# Patient Record
Sex: Female | Born: 1955 | Race: White | Hispanic: No | Marital: Married | Smoking: Former smoker
Health system: Southern US, Community
[De-identification: ages and names within clinical notes are randomized; demographics above are authoritative.]

## PROBLEM LIST (undated history)

## (undated) DIAGNOSIS — F32A Depression, unspecified: Secondary | ICD-10-CM

## (undated) DIAGNOSIS — R87619 Unspecified abnormal cytological findings in specimens from cervix uteri: Secondary | ICD-10-CM

## (undated) DIAGNOSIS — F329 Major depressive disorder, single episode, unspecified: Secondary | ICD-10-CM

## (undated) DIAGNOSIS — I10 Essential (primary) hypertension: Secondary | ICD-10-CM

## (undated) DIAGNOSIS — K8020 Calculus of gallbladder without cholecystitis without obstruction: Secondary | ICD-10-CM

## (undated) DIAGNOSIS — E119 Type 2 diabetes mellitus without complications: Secondary | ICD-10-CM

## (undated) HISTORY — DX: Type 2 diabetes mellitus without complications: E11.9

## (undated) HISTORY — DX: Unspecified abnormal cytological findings in specimens from cervix uteri: R87.619

## (undated) HISTORY — DX: Essential (primary) hypertension: I10

## (undated) HISTORY — DX: Major depressive disorder, single episode, unspecified: F32.9

## (undated) HISTORY — PX: COLPOSCOPY: SHX161

## (undated) HISTORY — DX: Depression, unspecified: F32.A

## (undated) HISTORY — PX: LUMBAR EPIDURAL INJECTION: SHX1980

## (undated) HISTORY — PX: CERVIX LESION DESTRUCTION: SHX591

---
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
## 1998-08-30 ENCOUNTER — Other Ambulatory Visit: Admission: RE | Admit: 1998-08-30 | Discharge: 1998-08-30 | Payer: Self-pay | Admitting: Obstetrics and Gynecology

## 1999-06-11 ENCOUNTER — Other Ambulatory Visit: Admission: RE | Admit: 1999-06-11 | Discharge: 1999-06-11 | Payer: Self-pay | Admitting: *Deleted

## 1999-10-10 ENCOUNTER — Encounter: Admission: RE | Admit: 1999-10-10 | Discharge: 1999-10-10 | Payer: Self-pay | Admitting: Family Medicine

## 1999-10-10 ENCOUNTER — Encounter: Payer: Self-pay | Admitting: Family Medicine

## 2000-06-25 ENCOUNTER — Other Ambulatory Visit: Admission: RE | Admit: 2000-06-25 | Discharge: 2000-06-25 | Payer: Self-pay | Admitting: *Deleted

## 2000-12-01 ENCOUNTER — Encounter: Payer: Self-pay | Admitting: *Deleted

## 2000-12-01 ENCOUNTER — Encounter: Admission: RE | Admit: 2000-12-01 | Discharge: 2000-12-01 | Payer: Self-pay | Admitting: *Deleted

## 2001-08-30 ENCOUNTER — Other Ambulatory Visit: Admission: RE | Admit: 2001-08-30 | Discharge: 2001-08-30 | Payer: Self-pay | Admitting: Obstetrics and Gynecology

## 2001-12-22 ENCOUNTER — Encounter: Admission: RE | Admit: 2001-12-22 | Discharge: 2001-12-22 | Payer: Self-pay | Admitting: Obstetrics and Gynecology

## 2001-12-22 ENCOUNTER — Encounter: Payer: Self-pay | Admitting: Obstetrics and Gynecology

## 2002-09-14 ENCOUNTER — Other Ambulatory Visit: Admission: RE | Admit: 2002-09-14 | Discharge: 2002-09-14 | Payer: Self-pay | Admitting: Obstetrics and Gynecology

## 2003-12-21 ENCOUNTER — Other Ambulatory Visit: Admission: RE | Admit: 2003-12-21 | Discharge: 2003-12-21 | Payer: Self-pay | Admitting: Obstetrics and Gynecology

## 2005-01-06 ENCOUNTER — Other Ambulatory Visit: Admission: RE | Admit: 2005-01-06 | Discharge: 2005-01-06 | Payer: Self-pay | Admitting: Obstetrics and Gynecology

## 2005-09-18 ENCOUNTER — Emergency Department (HOSPITAL_COMMUNITY): Admission: EM | Admit: 2005-09-18 | Discharge: 2005-09-18 | Payer: Self-pay | Admitting: Emergency Medicine

## 2007-10-24 ENCOUNTER — Emergency Department (HOSPITAL_COMMUNITY): Admission: EM | Admit: 2007-10-24 | Discharge: 2007-10-24 | Payer: Self-pay | Admitting: Emergency Medicine

## 2008-11-23 ENCOUNTER — Encounter: Admission: RE | Admit: 2008-11-23 | Discharge: 2008-11-23 | Payer: Self-pay | Admitting: Family Medicine

## 2011-01-22 LAB — DIFFERENTIAL
Basophils Absolute: 0.1
Basophils Relative: 1
Eosinophils Absolute: 0.1
Eosinophils Relative: 2
Lymphocytes Relative: 41
Lymphs Abs: 2.3
Monocytes Absolute: 0.4
Monocytes Relative: 7
Neutro Abs: 2.7
Neutrophils Relative %: 48

## 2011-01-22 LAB — CBC
HCT: 39.1
Hemoglobin: 13.7
MCHC: 35
MCV: 85
Platelets: 166
RBC: 4.6
RDW: 13.2
WBC: 5.7

## 2011-01-22 LAB — POCT I-STAT, CHEM 8
BUN: 10
Calcium, Ion: 1.14
Chloride: 107
Creatinine, Ser: 0.8
Glucose, Bld: 114 — ABNORMAL HIGH
TCO2: 24

## 2011-01-22 LAB — POCT CARDIAC MARKERS
Operator id: 196461
Troponin i, poc: <0.05

## 2012-07-20 ENCOUNTER — Other Ambulatory Visit: Payer: Self-pay | Admitting: Obstetrics and Gynecology

## 2012-07-20 DIAGNOSIS — R1011 Right upper quadrant pain: Secondary | ICD-10-CM

## 2012-07-22 ENCOUNTER — Ambulatory Visit
Admission: RE | Admit: 2012-07-22 | Discharge: 2012-07-22 | Disposition: A | Discharge status: Home / Self Care | Payer: BC Managed Care – PPO | Source: Ambulatory Visit | Attending: Obstetrics and Gynecology | Admitting: Obstetrics and Gynecology

## 2012-07-22 ENCOUNTER — Other Ambulatory Visit: Payer: Self-pay | Admitting: Obstetrics and Gynecology

## 2012-07-22 DIAGNOSIS — R1011 Right upper quadrant pain: Secondary | ICD-10-CM

## 2012-08-04 ENCOUNTER — Encounter (INDEPENDENT_AMBULATORY_CARE_PROVIDER_SITE_OTHER): Payer: Self-pay | Admitting: General Surgery

## 2012-08-04 ENCOUNTER — Ambulatory Visit (INDEPENDENT_AMBULATORY_CARE_PROVIDER_SITE_OTHER): Payer: BC Managed Care – PPO | Admitting: General Surgery

## 2012-08-04 VITALS — BP 132/80 | HR 64 | Temp 98.0°F | Resp 18 | Ht 63.0 in | Wt 192.0 lb

## 2012-08-04 DIAGNOSIS — K8020 Calculus of gallbladder without cholecystitis without obstruction: Secondary | ICD-10-CM

## 2012-08-04 DIAGNOSIS — R109 Unspecified abdominal pain: Secondary | ICD-10-CM

## 2012-08-04 HISTORY — DX: Calculus of gallbladder without cholecystitis without obstruction: K80.20

## 2012-08-04 NOTE — Progress Notes (Signed)
Patient ID: Maria Watkins, female   DOB: [birth year], 57 y.o.   MRN: [id number]  Chief Complaint  Patient presents with  . New Evaluation    Maria Watkins w/stone    HPI Maria Watkins is a 57 y.o. female.  She is referred by her gynecologist, Dr. Duane Lope, for evaluation of gallstones.Her husband is with her throughout the encounter today. Her sister-in-law is a breast patient of mine.  The patient is menopausal. She has a 6 month history of intermittent right lower quadrant abdominal pain. This occurs daily, and is usually after strenuous work or heavy lifting. It does not bother her at night. She can eat anything she wants. Doesn't really have any gastrointestinal symptoms. She says occasionally if she eats a hotdog without the bun she'll have a little bit of reflux, a little bit of indigestion but no nausea. No change in her bowel habits. When specifically questioned, she confirms that the pain is in the right lower abdomen, not the right upper abdomen.  A CT scan of the abdomen does show several small gallstones but no inflammatory change. The abdominal wall appears intact without any musculoskeletal abnormality.She did not get a CT scan of the pelvis. She has not had a pelvic ultrasound. She has not had any abdominal surgery in the past. One pregnancy and one vaginal delivery. Well-controlled hypertension.  She admits to being extremely anxious and is very fearful of having surgery.  HPI  History reviewed. No pertinent past medical history.  Past Surgical History  Procedure Laterality Date  . None      no hx  surgerys    Family History  Problem Relation Age of Onset  . Diabetes Mother   . Arthritis Mother   . COPD Father   . Asthma Father   . Heart disease Father   . Diabetes Sister   . COPD Sister   . Rheum arthritis Sister     Social History History  Substance Use Topics  . Smoking status: Former Games developer  . Smokeless tobacco: Not on file     Comment: quit 20 yrs ago  .  Alcohol Use: Not on file    No Known Allergies  Current Outpatient Prescriptions  Medication Sig Dispense Refill  . hydrochlorothiazide (MICROZIDE) 12.5 MG capsule Take 12.5 mg by mouth daily.       No current facility-administered medications for this visit.    Review of Systems Review of Systems  Constitutional: Negative for fever, chills and unexpected weight change.  HENT: Negative for hearing loss, congestion, sore throat, trouble swallowing and voice change.   Eyes: Negative for visual disturbance.  Respiratory: Negative for cough and wheezing.   Cardiovascular: Negative for chest pain, palpitations and leg swelling.  Gastrointestinal: Positive for abdominal pain. Negative for nausea, vomiting, diarrhea, constipation, blood in stool, abdominal distention and anal bleeding.  Genitourinary: Negative for hematuria, vaginal bleeding and difficulty urinating.  Musculoskeletal: Negative for arthralgias.  Skin: Negative for rash and wound.  Neurological: Negative for seizures, syncope and headaches.  Hematological: Negative for adenopathy. Does not bruise/bleed easily.  Psychiatric/Behavioral: Negative for confusion.    Blood pressure 132/80, pulse 64, temperature 98 F (36.7 C), resp. rate 18, height 5\' 3"  (1.6 m), weight 192 lb (87.091 kg).  Physical Exam Physical Exam  Constitutional: She is oriented to person, place, and time. She appears well-developed and well-nourished. No distress.  HENT:  Head: Normocephalic and atraumatic.  Nose: Nose normal.  Mouth/Throat: No oropharyngeal exudate.  Eyes: Conjunctivae  and EOM are normal. Pupils are equal, round, and reactive to light. Left eye exhibits no discharge. No scleral icterus.  Neck: Neck supple. No JVD present. No tracheal deviation present. No thyromegaly present.  Cardiovascular: Normal rate, regular rhythm, normal heart sounds and intact distal pulses.   No murmur heard. Pulmonary/Chest: Effort normal and breath sounds  normal. No respiratory distress. She has no wheezes. She has no rales. She exhibits no tenderness.  Abdominal: Soft. Bowel sounds are normal. She exhibits no distension and no mass. There is no tenderness. There is no rebound and no guarding.  Other than obesity, essentially a benign abdominal exam.  Musculoskeletal: She exhibits no edema and no tenderness.  Lymphadenopathy:    She has no cervical adenopathy.  Neurological: She is alert and oriented to person, place, and time. She exhibits normal muscle tone. Coordination normal.  Skin: Skin is warm. No rash noted. She is not diaphoretic. No erythema. No pallor.  Psychiatric: She has a normal mood and affect. Her behavior is normal. Judgment and thought content normal.    Data Reviewed CT scan of abdomen.  Assessment    Right lower quadrant abdominal pain of uncertain etiology. History and physical findings suggest abdominal wall or musculoskeletal pain. This does not sound like a visceral pain.  Gallstones. Probably asymptomatic.  Obesity  Anxiety  Hypertension     Plan    We had a very long talk about her symptoms and her imaging findings. She absolutely does not want to have gallbladder surgery unless it is essential, and I told her that it was not certain that the gallbladder was the source of her pain.  We talked about further confirmatory testing.  At the end of the conversation we decided that we would pursue a trial of limited activities and anti-inflammatory medications. She is restricted to 15 pounds lifting for 3 weeks, but no strenuous activities or gym activities. She is advised to walk daily. weight reduction is strongly advised. Low-fat diet is recommended.  I told her that if the right-sided pain continues after 2 or 3 weeks to give me a call and we will set up a CT scan of the pelvis to evaluate the abdominal wall and pelvic muscles, hepatobiliary scan with CCK stimulation, and liver function tests.  If the pain  resolves, nothing further needs to be done.        Angelia Mould. Derrell Lolling, M.D., [facility] General and Minimally invasive Surgery Breast and Colorectal Surgery Office:   [phone] Pager:   [phone]  08/04/2012, 10:45 AM

## 2012-08-04 NOTE — Patient Instructions (Signed)
You have gallstones, and these are documented on her CT scan.  Your right lower quadrant pain sounds more like musculoskeletal, or abdominal wall pain. It does not sound like gallbladder pain.  Your gallbladder may certainly cause problems in the future, but I am not sure that a gallbladder operation will lead to resolution of the pain it should have right now.  We talked about several options. We decided that you would stop all sports, all heavy lifting, and all strenuous activities for 2 or 3 weeks. Take Advil for pain.  If yuor pain continues, then we should proceed with CT scan of pelvis, hepatobiliary scan and liver testing.  Call Dr. Derrell Lolling if your pain does not completely resolve.

## 2012-08-19 ENCOUNTER — Encounter (INDEPENDENT_AMBULATORY_CARE_PROVIDER_SITE_OTHER): Payer: Self-pay

## 2014-04-27 LAB — HM COLONOSCOPY

## 2014-06-04 ENCOUNTER — Other Ambulatory Visit: Payer: Self-pay | Admitting: Gastroenterology

## 2014-06-04 DIAGNOSIS — R945 Abnormal results of liver function studies: Principal | ICD-10-CM

## 2014-06-04 DIAGNOSIS — R7989 Other specified abnormal findings of blood chemistry: Secondary | ICD-10-CM

## 2014-06-12 ENCOUNTER — Other Ambulatory Visit: Payer: Self-pay

## 2014-06-18 ENCOUNTER — Ambulatory Visit
Admission: RE | Admit: 2014-06-18 | Discharge: 2014-06-18 | Disposition: A | Discharge status: Home / Self Care | Payer: Self-pay | Source: Ambulatory Visit | Attending: Gastroenterology | Admitting: Gastroenterology

## 2014-06-18 DIAGNOSIS — R7989 Other specified abnormal findings of blood chemistry: Secondary | ICD-10-CM

## 2014-06-18 DIAGNOSIS — R945 Abnormal results of liver function studies: Principal | ICD-10-CM

## 2016-01-02 ENCOUNTER — Ambulatory Visit (INDEPENDENT_AMBULATORY_CARE_PROVIDER_SITE_OTHER): Payer: BLUE CROSS/BLUE SHIELD | Admitting: Obstetrics & Gynecology

## 2016-01-02 ENCOUNTER — Other Ambulatory Visit: Payer: Self-pay | Admitting: Obstetrics & Gynecology

## 2016-01-02 ENCOUNTER — Encounter: Payer: Self-pay | Admitting: Obstetrics & Gynecology

## 2016-01-02 VITALS — BP 144/86 | HR 74 | Wt 191.0 lb

## 2016-01-02 DIAGNOSIS — Z01419 Encounter for gynecological examination (general) (routine) without abnormal findings: Secondary | ICD-10-CM | POA: Diagnosis not present

## 2016-01-02 DIAGNOSIS — Z1231 Encounter for screening mammogram for malignant neoplasm of breast: Secondary | ICD-10-CM

## 2016-01-02 DIAGNOSIS — E2839 Other primary ovarian failure: Secondary | ICD-10-CM

## 2016-01-02 DIAGNOSIS — Z0000 Encounter for general adult medical examination without abnormal findings: Secondary | ICD-10-CM | POA: Diagnosis not present

## 2016-01-02 DIAGNOSIS — Z124 Encounter for screening for malignant neoplasm of cervix: Secondary | ICD-10-CM

## 2016-01-02 NOTE — Progress Notes (Signed)
60 y.o. G1P1001 MarriedCaucasianF here for annual exam.  New patient.  I see her sister.  Patient reports she is nervous today.  Husband is disabled after fall on concrete floor at work in 2009.  Had ruptured disk in his neck and low back.  Has had two surgeries with Dr. Marikay Alaravid Jones, NSG.  She worked at [employer] for years.  Retired in 2011.    Pt reports her biggest problem is weight and hypertension.  She is seeing Dr. Uvaldo RisingMcNeil.  They have been discussing weight loss.    Last pap was 2015.  This was normal.  Reports she did go for her MMG.    PCP:  Dr. Corliss BlackerMcNeill.  Will get flu shot next week.  Has six month check up done.    Patient's last menstrual period was 04/27/2005 (approximate).          Sexually active: No.  The current method of family planning is post menopausal status.    Exercising: No.  The patient does not participate in regular exercise at present. Smoker:  Former  Health Maintenance: Pap:  10/2013 Neg.  Pt thinks she's had an HPV test in 2015.  History of abnormal Pap:  Yes.  Had a follow-up that was abnormal.  Had colposcopy and several biopsies.  Had cryo of cervix.  Follow up tests were normal.  This was in the 1990's. MMG:  12/2014 Normal  Colonoscopy:  08/2014 Normal - f/u 10 years, Dr. Loreta AveMann BMD:   Never TDaP:  2015 with PCP Pneumonia vaccine(s): with PCP Zostavax:   No yet  Hep C testing: Unsure  Screening Labs: PCP   reports that she quit smoking about 24 years ago. She has never used smokeless tobacco. She reports that she does not drink alcohol or use drugs.  Past Medical History:  Diagnosis Date  . Abnormal Pap smear of cervix 1980's  . Hypertension     Past Surgical History:  Procedure Laterality Date  . CERVIX LESION DESTRUCTION  1980's   Normal paps since then   . COLPOSCOPY  1980's  . none     no hx  surgerys    Current Outpatient Prescriptions  Medication Sig Dispense Refill  . hydrochlorothiazide (MICROZIDE) 12.5 MG capsule Take 12.5 mg by  mouth daily.    . Pediatric Multiple Vit-C-FA (PEDIATRIC MULTIVITAMIN) chewable tablet Chew 1 tablet by mouth daily.     No current facility-administered medications for this visit.     Family History  Problem Relation Age of Onset  . Diabetes Mother   . Arthritis Mother   . COPD Father   . Asthma Father   . Heart disease Father   . Diabetes Sister   . Asthma Brother   . COPD Sister   . Rheum arthritis Sister   . Breast cancer Maternal Aunt     ROS:  Pertinent items are noted in HPI.  Otherwise, a comprehensive ROS was negative.  Exam:   BP (!) 144/86 (BP Location: Right Arm, Patient Position: Sitting, Cuff Size: Large)   Pulse 74   Wt 191 lb (86.6 kg)   LMP 04/27/2005 (Approximate)   BMI 33.83 kg/m        Ht Readings from Last 3 Encounters:  08/04/12 5\' 3"  (1.6 m)   General appearance: alert, cooperative and appears stated age Head: Normocephalic, without obvious abnormality, atraumatic Neck: no adenopathy, supple, symmetrical, trachea midline and thyroid normal to inspection and palpation Breasts: normal appearance, no masses or tenderness Abdomen:  soft, non-tender; bowel sounds normal; no masses,  no organomegaly Extremities: extremities normal, atraumatic, no cyanosis or edema Skin: Skin color, texture, turgor normal. No rashes or lesions Lymph nodes: Cervical, supraclavicular, and axillary nodes normal. No abnormal inguinal nodes palpated Neurologic: Grossly normal  Pelvic: External genitalia:  no lesions              Urethra:  normal appearing urethra with no masses, tenderness or lesions              Bartholins and Skenes: normal                 Vagina: normal appearing vagina with normal color and discharge, no lesions              Cervix: no lesions              Pap taken: Yes.   Bimanual Exam:  Uterus:  normal size, contour, position, consistency, mobility, non-tender              Adnexa: normal adnexa and no mass, fullness, tenderness                Rectovaginal: Confirms               Anus:  normal sphincter tone, no lesions  Chaperone was present for exam.  A:  Well Woman with normal exam PMP, no HRT H/O gallstones, still has gallbladder H/O cryosurgery to cervix in the 1990's  P:   Mammogram yearly recommended. pap smear obtained today.  Will have pt sign release of lab work to make sure she's had HPV testing Lab work next week with Dr. Uvaldo Rising Pt has discussed Zostavax with PCP.  Not sure she wants to get it. Will plan Hep C testing with Dr. Uvaldo Rising next week when pt goes for lab work. Return annually or prn

## 2016-01-06 LAB — IPS PAP TEST WITH REFLEX TO HPV

## 2016-01-07 LAB — HM HEPATITIS C SCREENING LAB: HM HEPATITIS C SCREENING: NEGATIVE

## 2016-01-15 ENCOUNTER — Ambulatory Visit
Admission: RE | Admit: 2016-01-15 | Discharge: 2016-01-15 | Disposition: A | Discharge status: Home / Self Care | Payer: BLUE CROSS/BLUE SHIELD | Source: Ambulatory Visit | Attending: Obstetrics & Gynecology | Admitting: Obstetrics & Gynecology

## 2016-01-15 DIAGNOSIS — Z1231 Encounter for screening mammogram for malignant neoplasm of breast: Secondary | ICD-10-CM

## 2016-01-15 DIAGNOSIS — E2839 Other primary ovarian failure: Secondary | ICD-10-CM

## 2016-01-22 ENCOUNTER — Ambulatory Visit: Payer: 59 | Admitting: Family Medicine

## 2016-10-16 ENCOUNTER — Telehealth: Payer: Self-pay | Admitting: Obstetrics & Gynecology

## 2016-10-16 NOTE — Telephone Encounter (Signed)
Patient called requesting bone density results from 2017.

## 2016-10-19 NOTE — Telephone Encounter (Signed)
Return call to patient, no answer, no option to leave message.   BMD 01/15/16 results:  Written by Jerene BearsMiller, Mary S, MD on 01/20/2016 6:58 AM  Mrs. Cullers,  Your bone density was completely normal. I will plan to repeat this in 5 years. Please let me know if you have any questions.   Dr. Hyacinth MeekerMiller

## 2016-10-21 NOTE — Telephone Encounter (Signed)
Returned call to patient, no answer, no option to leave message.

## 2016-10-23 NOTE — Telephone Encounter (Signed)
Dr. Hyacinth MeekerMiller, attempted to reach patient x2 with no return call regarding BMD results from 2017. OK to send letter with results and recommendations as seen below?

## 2016-10-23 NOTE — Telephone Encounter (Signed)
Yes, it is completley fine to send a letter.  Thanks.

## 2016-11-02 NOTE — Telephone Encounter (Signed)
Letter to Arna Mediciora for mailing certified. Will close encounter.

## 2016-11-02 NOTE — Telephone Encounter (Signed)
Letter to Dr. Miller for signature. 

## 2017-01-25 LAB — HM MAMMOGRAPHY

## 2017-04-02 ENCOUNTER — Ambulatory Visit: Payer: BLUE CROSS/BLUE SHIELD | Admitting: Obstetrics & Gynecology

## 2017-10-16 LAB — HEPATIC FUNCTION PANEL
ALT: 12 (ref 7–35)
AST: 14 (ref 13–35)

## 2017-10-16 LAB — BASIC METABOLIC PANEL
Creatinine: 0.7 (ref <=1.1)
Glucose: 139

## 2017-10-16 LAB — MICROALBUMIN, URINE: Microalb, Ur: 2.7

## 2017-10-16 LAB — BASIC METABOLIC PANEL WITH GFR
BUN: 12 (ref 4–21)
Sodium: 139 (ref 137–147)

## 2017-10-16 LAB — HEMOGLOBIN A1C: Hemoglobin A1C: 6.8

## 2018-01-18 ENCOUNTER — Ambulatory Visit: Payer: BLUE CROSS/BLUE SHIELD | Admitting: Family Medicine

## 2018-01-18 ENCOUNTER — Encounter: Payer: Self-pay | Admitting: Family Medicine

## 2018-01-18 ENCOUNTER — Other Ambulatory Visit: Payer: Self-pay

## 2018-01-18 VITALS — BP 130/80 | HR 66 | Temp 98.0°F | Ht 63.0 in | Wt 184.6 lb

## 2018-01-18 DIAGNOSIS — E785 Hyperlipidemia, unspecified: Secondary | ICD-10-CM | POA: Insufficient documentation

## 2018-01-18 DIAGNOSIS — Z23 Encounter for immunization: Secondary | ICD-10-CM | POA: Diagnosis not present

## 2018-01-18 DIAGNOSIS — E119 Type 2 diabetes mellitus without complications: Secondary | ICD-10-CM

## 2018-01-18 DIAGNOSIS — E118 Type 2 diabetes mellitus with unspecified complications: Secondary | ICD-10-CM | POA: Insufficient documentation

## 2018-01-18 DIAGNOSIS — I10 Essential (primary) hypertension: Secondary | ICD-10-CM | POA: Diagnosis not present

## 2018-01-18 DIAGNOSIS — E1169 Type 2 diabetes mellitus with other specified complication: Secondary | ICD-10-CM | POA: Insufficient documentation

## 2018-01-18 HISTORY — DX: Type 2 diabetes mellitus without complications: E11.9

## 2018-01-18 LAB — LDL CHOLESTEROL, DIRECT: Direct LDL: 94 mg/dL

## 2018-01-18 LAB — COMPREHENSIVE METABOLIC PANEL
ALBUMIN: 4.6 g/dL (ref 3.5–5.2)
ALK PHOS: 119 U/L — AB (ref 39–117)
ALT: 12 U/L (ref 0–35)
AST: 13 U/L (ref 0–37)
BUN: 11 mg/dL (ref 6–23)
CO2: 30 mEq/L (ref 19–32)
CREATININE: 0.69 mg/dL (ref 0.40–1.20)
Calcium: 9.9 mg/dL (ref 8.4–10.5)
Chloride: 103 mEq/L (ref 96–112)
GFR: 90.98 mL/min (ref >60.00)
GLUCOSE: 112 mg/dL — AB (ref 70–99)
Potassium: 4.1 mEq/L (ref 3.5–5.1)
SODIUM: 139 meq/L (ref 135–145)
TOTAL PROTEIN: 8 g/dL (ref 6.0–8.3)
Total Bilirubin: 1.2 mg/dL (ref 0.2–1.2)

## 2018-01-18 LAB — LIPID PANEL
Cholesterol: 181 mg/dL (ref 0–200)
HDL: 45.3 mg/dL (ref >39.00)
NONHDL: 135.9
Total CHOL/HDL Ratio: 4
Triglycerides: 322 mg/dL — ABNORMAL HIGH (ref 0.0–149.0)
VLDL: 64.4 mg/dL — ABNORMAL HIGH (ref 0.0–40.0)

## 2018-01-18 LAB — HEMOGLOBIN A1C: HEMOGLOBIN A1C: 6.7 % — AB (ref 4.6–6.5)

## 2018-01-18 LAB — MICROALBUMIN / CREATININE URINE RATIO
Creatinine,U: 150 mg/dL
MICROALB UR: 2.4 mg/dL — AB (ref 0.0–1.9)
Microalb Creat Ratio: 1.6 mg/g (ref 0.0–30.0)

## 2018-01-18 NOTE — Progress Notes (Signed)
Subjective  CC:  Chief Complaint  Patient presents with  . Establish Care    Transfer Care from [facility], last Physical January 2019, flu shot today.   . Diabetes    last A1c was in June 2019  . Hypertension    HPI: Maria Watkins is a 62 y.o. female who presents to [facility] at [facility] today to establish care with me as a new patient.   She has the following concerns or needs:  Former patient of [facility] primary care.  She brings in last visit from her PCP in June.  Has associated lab work including A1c of 6.8, normal CMP and positive microalbuminuria.  She has history of hypertension on lisinopril 5 mg daily.  Blood pressures have been well controlled.  She has been trying to manage her early diabetes without complications with dietary changes.  Her goal would be to avoid further medications and reverse disease if possible.  She is a married mother of a 48 year old son.  Her husband has some chronic medical diseases and patient is a primary caretaker for him.  She is retired since 2011.  She has close family ties with her siblings.  However, she is somewhat limited in doing things outside of the home because of her husband.  This weighs on her at times.  She denies symptoms of depression or history of mood disorder.  She tends to be optimistic.  Diet is fairly good although she drinks 2 to 3 20 ounce sodas per day.  She is not a big meat eater.  She tends to cook in the home.  Her husband likes to eat meat potato and veggies.  She enjoys vegetables.  She grows a garden.  She is motivated to work on and improve diet for weight loss and management of her diabetes.  She denies symptoms of hypoglycemia.  She has no foot concerns.  Eye exam is up-to-date.  She has not had her pneumonia vaccination yet.  She denies history of hyperlipidemia.  Assessment  1. Type 2 diabetes mellitus without complication, without long-term current use of insulin (HCC)   2.  Essential hypertension   3. Need for influenza vaccination      Plan   Type 2 diabetes: Discussion about diabetic management options, complications of disease, association with nutrition, optimal management strategies including statin, aspirin, and diabetic hypoglycemics.  Discussed complications of diabetes is uncontrolled.  Patient electing to change diet.  Will work on dietary changes, weight loss and recheck in 3 months.  No medication started today.  Patient defers Pneumovax until next visit.  This is reasonable  Check lab work including renal function, LFTs, lipids and urine.  Flu shot given today  Continue lisinopril for blood pressure control.  Currently well controlled.  Follow up:  Return in about 3 months (around 04/19/2018) for follow up of diabetes and hypertension. Orders Placed This Encounter  Procedures  . HM MAMMOGRAPHY  . Flu Vaccine QUAD 36+ mos IM  . Hemoglobin A1c  . Lipid panel  . Comprehensive metabolic panel  . Microalbumin / creatinine urine ratio  . HM HEPATITIS C SCREENING LAB  . Microalbumin, urine  . Basic metabolic panel  . Hepatic function panel  . Hemoglobin A1c  . HM COLONOSCOPY   No orders of the defined types were placed in this encounter.    No flowsheet data found.  We updated and reviewed the patient's past history in detail and it is documented  below.  Patient Active Problem List   Diagnosis Date Noted  . Essential hypertension 01/18/2018  . Type 2 diabetes mellitus without complication, without long-term current use of insulin (HCC) 01/18/2018  . Gallstones 08/04/2012   Health Maintenance  Topic Date Due  . MAMMOGRAM  01/25/2018  . HEMOGLOBIN A1C  04/17/2018  . OPHTHALMOLOGY EXAM  04/27/2018  . PAP SMEAR  01/02/2019  . FOOT EXAM  01/19/2019  . TETANUS/TDAP  04/28/2023  . COLONOSCOPY  08/25/2024  . INFLUENZA VACCINE  Completed  . Hepatitis C Screening  Completed  . HIV Screening  Discontinued   Immunization History    Administered Date(s) Administered  . Influenza,inj,Quad PF,6+ Mos 01/18/2018  . Tdap 04/27/2013   No outpatient medications have been marked as taking for the 01/18/18 encounter (Office Visit) with Willow OraAndy, Lazar Tierce L, MD.    Allergies: Patient is allergic to terconazole. Past Medical History Patient  has a past medical history of Abnormal Pap smear of cervix (1980's), Depression, Hypertension, and Type 2 diabetes mellitus without complication, without long-term current use of insulin (HCC) (01/18/2018). Past Surgical History Patient  has a past surgical history that includes Colposcopy (1980's) and Cervix lesion destruction (1980s). Family History: Patient family history includes Alcohol abuse in her son; Arthritis in her mother; Asthma in her brother and father; Breast cancer in her maternal aunt; COPD in her father and sister; Diabetes in her mother and sister; Heart disease in her father; Rheum arthritis in her sister. Social History:  Patient  reports that she quit smoking about 26 years ago. She has never used smokeless tobacco. She reports that she does not drink alcohol or use drugs.  Review of Systems: Constitutional: negative for fever or malaise Ophthalmic: negative for photophobia, double vision or loss of vision Cardiovascular: negative for chest pain, dyspnea on exertion, or new LE swelling Respiratory: negative for SOB or persistent cough Gastrointestinal: negative for abdominal pain, change in bowel habits or melena Genitourinary: negative for dysuria or gross hematuria Musculoskeletal: negative for new gait disturbance or muscular weakness Integumentary: negative for new or persistent rashes Neurological: negative for TIA or stroke symptoms Psychiatric: negative for SI or delusions Allergic/Immunologic: negative for hives  Patient Care Team    Relationship Specialty Notifications Start End  Willow OraAndy, Kiely Cousar L, MD PCP - General Family Medicine  01/18/18   Charna ElizabethMann, Jyothi, MD  Consulting Physician Gastroenterology  01/18/18   Marlow Baarslark, Dyanna, MD Consulting Physician Obstetrics  01/18/18     Objective  Vitals: BP 130/80   Pulse 66   Temp 98 F (36.7 C)   Ht 5\' 3"  (1.6 m)   Wt 184 lb 9.6 oz (83.7 kg)   LMP 04/27/2005 (Approximate)   SpO2 98%   BMI 32.70 kg/m  General:  Well developed, well nourished, no acute distress  Psych:  Alert and oriented,normal mood and affect HEENT:  Normocephalic, atraumatic, non-icteric sclera, PERRL, oropharynx is without mass or exudate, supple neck without adenopathy, mass or thyromegaly Cardiovascular:  RRR without gallop, rub or murmur, nondisplaced PMI Respiratory:  Good breath sounds bilaterally, CTAB with normal respiratory effort Gastrointestinal: normal bowel sounds, soft, non-tender, no noted masses. No HSM MSK: no deformities, contusions. Joints are without erythema or swelling Skin:  Warm, no rashes or suspicious lesions noted Neurologic:    Mental status is normal. Gross motor and sensory exams are normal. Normal gait Diabetic Foot Exam: Appearance - no lesions, ulcers or calluses Skin - no sigificant pallor or erythema Monofilament testing - sensitive bilaterally in  following locations:  Right - Great toe, medial, central, lateral ball and posterior foot intact  Left - Great toe, medial, central, lateral ball and posterior foot intact Pulses - +2 distally bilaterally    Commons side effects, risks, benefits, and alternatives for medications and treatment plan prescribed today were discussed, and the patient expressed understanding of the given instructions. Patient is instructed to call or message via MyChart if he/she has any questions or concerns regarding our treatment plan. No barriers to understanding were identified. We discussed Red Flag symptoms and signs in detail. Patient expressed understanding regarding what to do in case of urgent or emergency type symptoms.   Medication list was reconciled, printed and  provided to the patient in AVS. Patient instructions and summary information was reviewed with the patient as documented in the AVS. This note was prepared with assistance of Dragon voice recognition software. Occasional wrong-word or sound-a-like substitutions may have occurred due to the inherent limitations of voice recognition software

## 2018-01-18 NOTE — Patient Instructions (Signed)
Please return in 3 months for diabetes follow up   It was a pleasure meeting you today! Thank you for choosing us to meet your healthcare needs! I truly look forward to working with you. If you have any questions or concerns, please send me a message via Mychart or call the office at [phone].  Consider watching the documentaries: forks over knives, eating you alive, fed up  Engine [employer] - plant strong website has good information  Reversing Cardiovascular disease cook book   Good luck! No more sodas!

## 2018-01-19 ENCOUNTER — Telehealth: Payer: Self-pay | Admitting: Emergency Medicine

## 2018-01-19 NOTE — Telephone Encounter (Signed)
Copied from CRM [id number]. Topic: Inquiry >> Jan 18, 2018  4:41 PM Baldo Daub L wrote: Reason for CRM:   Pt wants to know if visit today can be coded as a wellness visit because she gets a $25 gift card for having wellness visits done. Pt can be reached at [phone]  Please advise.   Kathi Simpers,  LPN

## 2018-01-19 NOTE — Telephone Encounter (Signed)
Spoke with Patient and discussed recommendations. Patient verbalized understanding.   Kathi Simpers,  LPN

## 2018-01-19 NOTE — Telephone Encounter (Signed)
No, it can't. But we will schedule a wellness visit so she will have that soon.

## 2018-02-02 ENCOUNTER — Other Ambulatory Visit: Payer: Self-pay | Admitting: Family Medicine

## 2018-02-02 MED ORDER — LISINOPRIL 5 MG PO TABS: 5.0000 mg | ORAL_TABLET | Freq: Every day | ORAL | 1 refills | Status: DC

## 2018-02-02 NOTE — Telephone Encounter (Signed)
Copied from CRM [id number]. Topic: Quick Communication - Rx Refill/Question >> Feb 02, 2018  3:40 PM Angela Nevin wrote: Medication: lisinopril (PRINIVIL,ZESTRIL) 5 MG tablet [[id number]]   Pt would like to know if Dr. Mardelle Matte would refill this medication, stating they had discussed pt continuing to take it at last Ov.   Preferred Pharmacy (with phone number or street name):  [facility] [address] [place] - [place]  [address] [phone] (Phone) [phone] (Fax)

## 2018-02-02 NOTE — Telephone Encounter (Signed)
Requested medication (s) are due for refill today -yes  Requested medication (s) are on the active medication list yes  Future visit scheduled yes  Notes to clinic: historical medication- per OV patient to continue medication- please review for refill.  Requested Prescriptions  Pending Prescriptions Disp Refills   lisinopril (PRINIVIL,ZESTRIL) 5 MG tablet 90 tablet 1    Sig: Take 1 tablet (5 mg total) by mouth daily.     There is no refill protocol information for this order

## 2018-04-14 ENCOUNTER — Encounter: Payer: Self-pay | Admitting: Family Medicine

## 2018-04-14 ENCOUNTER — Other Ambulatory Visit: Payer: Self-pay

## 2018-04-14 ENCOUNTER — Ambulatory Visit: Payer: BLUE CROSS/BLUE SHIELD | Admitting: Family Medicine

## 2018-04-14 VITALS — BP 118/76 | HR 71 | Temp 98.4°F | Resp 16 | Ht 63.0 in | Wt 180.2 lb

## 2018-04-14 DIAGNOSIS — E119 Type 2 diabetes mellitus without complications: Secondary | ICD-10-CM

## 2018-04-14 DIAGNOSIS — Z23 Encounter for immunization: Secondary | ICD-10-CM | POA: Diagnosis not present

## 2018-04-14 DIAGNOSIS — I10 Essential (primary) hypertension: Secondary | ICD-10-CM | POA: Diagnosis not present

## 2018-04-14 LAB — POCT GLYCOSYLATED HEMOGLOBIN (HGB A1C): Hemoglobin A1C: 6.3 % — AB (ref 4.0–5.6)

## 2018-04-14 NOTE — Progress Notes (Signed)
Subjective  CC:  Chief Complaint  Patient presents with  . Diabetes  . Hypertension    HPI: Maria Watkins is a 62 y.o. female who presents to the office today for follow up of diabetes and problems listed above in the chief complaint.   Diabetes follow up: Her diabetic control is reported as Improved. Has improved diet; less sodas. Weight is down. Feels well.  She denies exertional CP or SOB or symptomatic hypoglycemia. She denies foot sores or paresthesias.   bp is better as well on low dose ace with + microalbuminuria.   Assessment  1. Type 2 diabetes mellitus without complication, without long-term current use of insulin (HCC)   2. Essential hypertension      Plan   Diabetes is currently well controlled. Diet controlled prediabetes now. Continue with diet changes and recheck in 3 months. Stop all sodas. Continue exercise. Update pneumovax today.   HTN is controlled well on ace.   Follow up: Return in about 3 months (around 07/14/2018) for follow up of diabetes and hypertension.. Orders Placed This Encounter  Procedures  . Pneumococcal polysaccharide vaccine 23-valent greater than or equal to 2yo subcutaneous/IM  . POCT glycosylated hemoglobin (Hb A1C)   No orders of the defined types were placed in this encounter.     Immunization History  Administered Date(s) Administered  . Influenza,inj,Quad PF,6+ Mos 01/18/2018  . Tdap 04/27/2013    Diabetes Related Lab Review: Lab Results  Component Value Date   HGBA1C 6.3 (A) 04/14/2018   HGBA1C 6.7 (H) 01/18/2018   HGBA1C 6.8 10/16/2017    Lab Results  Component Value Date   MICROALBUR 2.4 (H) 01/18/2018   Lab Results  Component Value Date   CREATININE 0.69 01/18/2018   BUN 11 01/18/2018   NA 139 01/18/2018   K 4.1 01/18/2018   CL 103 01/18/2018   CO2 30 01/18/2018   Lab Results  Component Value Date   CHOL 181 01/18/2018   Lab Results  Component Value Date   HDL 45.30 01/18/2018   No results found  for: [facility] Lab Results  Component Value Date   TRIG 322.0 (H) 01/18/2018   Lab Results  Component Value Date   CHOLHDL 4 01/18/2018   Lab Results  Component Value Date   LDLDIRECT 94.0 01/18/2018   The 10-year ASCVD risk score Denman George(Goff DC Jr., et al., 2013) is: 11.1%   Values used to calculate the score:     Age: [age] years     Sex: Female     Is Non-Hispanic African American: No     Diabetic: Yes     Tobacco smoker: No     Systolic Blood Pressure: 118 mmHg     Is BP treated: Yes     HDL Cholesterol: 45.3 mg/dL     Total Cholesterol: 181 mg/dL I have reviewed the PMH, Fam and Soc history. Patient Active Problem List   Diagnosis Date Noted  . Essential hypertension 01/18/2018  . Type 2 diabetes mellitus without complication, without long-term current use of insulin (HCC) 01/18/2018  . Gallstones 08/04/2012    Social History: Patient  reports that she quit smoking about 26 years ago. She has never used smokeless tobacco. She reports that she does not drink alcohol or use drugs.  Review of Systems: Ophthalmic: negative for eye pain, loss of vision or double vision Cardiovascular: negative for chest pain Respiratory: negative for SOB or persistent cough Gastrointestinal: negative for abdominal pain Genitourinary: negative for dysuria  or gross hematuria MSK: negative for foot lesions Neurologic: negative for weakness or gait disturbance  Wt Readings from Last 3 Encounters:  04/14/18 180 lb 3.2 oz (81.7 kg)  01/18/18 184 lb 9.6 oz (83.7 kg)  01/02/16 191 lb (86.6 kg)    Objective  Vitals: BP 118/76   Pulse 71   Temp 98.4 F (36.9 C) (Oral)   Resp 16   Ht 5\' 3"  (1.6 m)   Wt 180 lb 3.2 oz (81.7 kg)   LMP 04/27/2005 (Approximate)   SpO2 98%   BMI 31.92 kg/m  General: well appearing, no acute distress  Psych:  Alert and oriented, normal mood and affect HEENT:  Normocephalic, atraumatic, moist mucous membranes, supple neck  Cardiovascular:  Nl S1 and S2, RRR without  murmur, gallop or rub. no edema Respiratory:  Good breath sounds bilaterally, CTAB with normal effort, no rales Gastrointestinal: normal BS, soft, nontender     Diabetic education: ongoing education regarding chronic disease management for diabetes was given today. We continue to reinforce the ABC's of diabetic management: A1c (<7 or 8 dependent upon patient), tight blood pressure control, and cholesterol management with goal LDL < 100 minimally. We discuss diet strategies, exercise recommendations, medication options and possible side effects. At each visit, we review recommended immunizations and preventive care recommendations for diabetics and stress that good diabetic control can prevent other problems. See below for this patient's data.    Commons side effects, risks, benefits, and alternatives for medications and treatment plan prescribed today were discussed, and the patient expressed understanding of the given instructions. Patient is instructed to call or message via MyChart if he/she has any questions or concerns regarding our treatment plan. No barriers to understanding were identified. We discussed Red Flag symptoms and signs in detail. Patient expressed understanding regarding what to do in case of urgent or emergency type symptoms.   Medication list was reconciled, printed and provided to the patient in AVS. Patient instructions and summary information was reviewed with the patient as documented in the AVS. This note was prepared with assistance of Dragon voice recognition software. Occasional wrong-word or sound-a-like substitutions may have occurred due to the inherent limitations of voice recognition software

## 2018-04-14 NOTE — Patient Instructions (Signed)
Please return in 3 months for diabetes follow up   If you have any questions or concerns, please don't hesitate to send me a message via MyChart or call the office at [phone]. Thank you for visiting with us today! It's our pleasure caring for you.   Diabetes Mellitus and Nutrition, Adult When you have diabetes (diabetes mellitus), it is very important to have healthy eating habits because your blood sugar (glucose) levels are greatly affected by what you eat and drink. Eating healthy foods in the appropriate amounts, at about the same times every day, can help you:  Control your blood glucose.  Lower your risk of heart disease.  Improve your blood pressure.  Reach or maintain a healthy weight. Every person with diabetes is different, and each person has different needs for a meal plan. Your health care provider may recommend that you work with a diet and nutrition specialist (dietitian) to make a meal plan that is best for you. Your meal plan may vary depending on factors such as:  The calories you need.  The medicines you take.  Your weight.  Your blood glucose, blood pressure, and cholesterol levels.  Your activity level.  Other health conditions you have, such as heart or kidney disease. How do carbohydrates affect me? Carbohydrates, also called carbs, affect your blood glucose level more than any other type of food. Eating carbs naturally raises the amount of glucose in your blood. Carb counting is a method for keeping track of how many carbs you eat. Counting carbs is important to keep your blood glucose at a healthy level, especially if you use insulin or take certain oral diabetes medicines. It is important to know how many carbs you can safely have in each meal. This is different for every person. Your dietitian can help you calculate how many carbs you should have at each meal and for each snack. Foods that contain carbs include:  Bread, cereal, rice, pasta, and  crackers.  Potatoes and corn.  Peas, beans, and lentils.  Milk and yogurt.  Fruit and juice.  Desserts, such as cakes, cookies, ice cream, and candy. How does alcohol affect me? Alcohol can cause a sudden decrease in blood glucose (hypoglycemia), especially if you use insulin or take certain oral diabetes medicines. Hypoglycemia can be a life-threatening condition. Symptoms of hypoglycemia (sleepiness, dizziness, and confusion) are similar to symptoms of having too much alcohol. If your health care provider says that alcohol is safe for you, follow these guidelines:  Limit alcohol intake to no more than 1 drink per day for nonpregnant women and 2 drinks per day for men. One drink equals 12 oz of beer, 5 oz of wine, or 1 oz of hard liquor.  Do not drink on an empty stomach.  Keep yourself hydrated with water, diet soda, or unsweetened iced tea.  Keep in mind that regular soda, juice, and other mixers may contain a lot of sugar and must be counted as carbs. What are tips for following this plan?  Reading food labels  Start by checking the serving size on the "Nutrition Facts" label of packaged foods and drinks. The amount of calories, carbs, fats, and other nutrients listed on the label is based on one serving of the item. Many items contain more than one serving per package.  Check the total grams (g) of carbs in one serving. You can calculate the number of servings of carbs in one serving by dividing the total carbs by 15. For example,  if a food has 30 g of total carbs, it would be equal to 2 servings of carbs.  Check the number of grams (g) of saturated and trans fats in one serving. Choose foods that have low or no amount of these fats.  Check the number of milligrams (mg) of salt (sodium) in one serving. Most people should limit total sodium intake to less than 2,300 mg per day.  Always check the nutrition information of foods labeled as "low-fat" or "nonfat". These foods may be  higher in added sugar or refined carbs and should be avoided.  Talk to your dietitian to identify your daily goals for nutrients listed on the label. Shopping  Avoid buying canned, premade, or processed foods. These foods tend to be high in fat, sodium, and added sugar.  Shop around the outside edge of the grocery store. This includes fresh fruits and vegetables, bulk grains, fresh meats, and fresh dairy. Cooking  Use low-heat cooking methods, such as baking, instead of high-heat cooking methods like deep frying.  Cook using healthy oils, such as olive, canola, or sunflower oil.  Avoid cooking with butter, cream, or high-fat meats. Meal planning  Eat meals and snacks regularly, preferably at the same times every day. Avoid going long periods of time without eating.  Eat foods high in fiber, such as fresh fruits, vegetables, beans, and whole grains. Talk to your dietitian about how many servings of carbs you can eat at each meal.  Eat 4-6 ounces (oz) of lean protein each day, such as lean meat, chicken, fish, eggs, or tofu. One oz of lean protein is equal to: ? 1 oz of meat, chicken, or fish. ? 1 egg. ?  cup of tofu.  Eat some foods each day that contain healthy fats, such as avocado, nuts, seeds, and fish. Lifestyle  Check your blood glucose regularly.  Exercise regularly as told by your health care provider. This may include: ? 150 minutes of moderate-intensity or vigorous-intensity exercise each week. This could be brisk walking, biking, or water aerobics. ? Stretching and doing strength exercises, such as yoga or weightlifting, at least 2 times a week.  Take medicines as told by your health care provider.  Do not use any products that contain nicotine or tobacco, such as cigarettes and e-cigarettes. If you need help quitting, ask your health care provider.  Work with a Veterinary surgeoncounselor or diabetes educator to identify strategies to manage stress and any emotional and social  challenges. Questions to ask a health care provider  Do I need to meet with a diabetes educator?  Do I need to meet with a dietitian?  What number can I call if I have questions?  When are the best times to check my blood glucose? Where to find more information:  American Diabetes Association: diabetes.org  Academy of Nutrition and Dietetics: www.eatright.[employer]  National Institute of Diabetes and Digestive and Kidney Diseases (NIH): Car[employer].tnwww.niddk.nih.gov Summary  A healthy meal plan will help you control your blood glucose and maintain a healthy lifestyle.  Working with a diet and nutrition specialist (dietitian) can help you make a meal plan that is best for you.  Keep in mind that carbohydrates (carbs) and alcohol have immediate effects on your blood glucose levels. It is important to count carbs and to use alcohol carefully. This information is not intended to replace advice given to you by your health care provider. Make sure you discuss any questions you have with your health care provider. Document Released: 01/08/2005  Document Revised: 11/11/2016 Document Reviewed: 05/18/2016 Elsevier Interactive Patient Education  [employer].

## 2018-07-11 ENCOUNTER — Ambulatory Visit: Payer: BLUE CROSS/BLUE SHIELD | Admitting: Family Medicine

## 2018-07-20 ENCOUNTER — Ambulatory Visit: Payer: BLUE CROSS/BLUE SHIELD | Admitting: Family Medicine

## 2018-07-28 ENCOUNTER — Encounter: Payer: Self-pay | Admitting: Family Medicine

## 2018-07-28 ENCOUNTER — Ambulatory Visit: Payer: BLUE CROSS/BLUE SHIELD | Admitting: Family Medicine

## 2018-07-28 ENCOUNTER — Other Ambulatory Visit: Payer: Self-pay

## 2018-07-28 ENCOUNTER — Ambulatory Visit (INDEPENDENT_AMBULATORY_CARE_PROVIDER_SITE_OTHER): Payer: PRIVATE HEALTH INSURANCE | Admitting: Family Medicine

## 2018-07-28 VITALS — BP 132/83 | HR 62 | Wt 184.0 lb

## 2018-07-28 DIAGNOSIS — I10 Essential (primary) hypertension: Secondary | ICD-10-CM

## 2018-07-28 DIAGNOSIS — E119 Type 2 diabetes mellitus without complications: Secondary | ICD-10-CM

## 2018-07-28 MED ORDER — LISINOPRIL 5 MG PO TABS: 10.0000 mg | ORAL_TABLET | Freq: Every day | ORAL | 1 refills | Status: DC

## 2018-07-28 NOTE — Patient Instructions (Signed)
Please return in 3 months for your annual complete physical; please come fasting.   If you have any questions or concerns, please don't hesitate to send me a message via MyChart or call the office at [phone]. Thank you for visiting with us today! It's our pleasure caring for you.   

## 2018-07-28 NOTE — Progress Notes (Signed)
TELEPHONE ENCOUNTER   Patient verbally agreed to telephone visit and is aware that copayment and coinsurance may apply. Patient was treated using telemedicine according to accepted telemedicine protocols.  Location of the patient: home Location of provider: [facility], [place] office Names of all persons participating in the telemedicine service and role in the encounter: Willow Ora, MD Rita Ohara, CMA    Subjective  CC:  Chief Complaint  Patient presents with  . Follow-up  . Diabetes    HPI: Maria Watkins is a 63 y.o. female who presents to the office today to address the problems listed above in the chief complaint.  Diabetes follow up: Her diabetic control is reported as Unchanged. She denies sxs of hyperglycemia. Has been less active and perhaps eating a bit more; weight is up 4 pounds. She is diet controlled.  She denies exertional CP or SOB or symptomatic hypoglycemia. She denies foot sores or paresthesias. imms are up to date. She reports a normal eye exam in august 2019; we will request report.   HTN f/u: has been checking routinely at home: 130s/80s consistently however today had higher readings with a new cuff. She feels fine. Feeling well. Taking medications w/o adverse effects. No symptoms of CHF, angina; no palpitations, sob, cp or lower extremity edema. Compliant with meds. Last checks in the office showed very good control   Immunization History  Administered Date(s) Administered  . Influenza,inj,Quad PF,6+ Mos 01/18/2018  . Pneumococcal Polysaccharide-23 04/14/2018  . Tdap 04/27/2013    Diabetes Related Lab Review: Lab Results  Component Value Date   HGBA1C 6.3 (A) 04/14/2018   Lab Results  Component Value Date   MICROALBUR 2.4 (H) 01/18/2018   Lab Results  Component Value Date   CREATININE 0.69 01/18/2018   BUN 11 01/18/2018   NA 139 01/18/2018   K 4.1 01/18/2018   CL 103 01/18/2018   CO2 30 01/18/2018   Lab Results   Component Value Date   CHOL 181 01/18/2018   Lab Results  Component Value Date   HDL 45.30 01/18/2018   No results found for: [facility] Lab Results  Component Value Date   TRIG 322.0 (H) 01/18/2018   Lab Results  Component Value Date   CHOLHDL 4 01/18/2018   Lab Results  Component Value Date   LDLDIRECT 94.0 01/18/2018   The 10-year ASCVD risk score Denman George DC Jr., et al., 2013) is: 13.8%   Values used to calculate the score:     Age: 50 years     Sex: Female     Is Non-Hispanic African American: No     Diabetic: Yes     Tobacco smoker: No     Systolic Blood Pressure: 132 mmHg     Is BP treated: Yes     HDL Cholesterol: 45.3 mg/dL     Total Cholesterol: 181 mg/dL   BP Readings from Last 3 Encounters:  07/28/18 132/83  04/14/18 118/76  01/18/18 130/80   Wt Readings from Last 3 Encounters:  07/28/18 184 lb (83.5 kg)  04/14/18 180 lb 3.2 oz (81.7 kg)  01/18/18 184 lb 9.6 oz (83.7 kg)    Lab Results  Component Value Date   CHOL 181 01/18/2018   Lab Results  Component Value Date   HDL 45.30 01/18/2018   No results found for: [facility] Lab Results  Component Value Date   TRIG 322.0 (H) 01/18/2018   Lab Results  Component Value Date   CHOLHDL 4 01/18/2018  Lab Results  Component Value Date   LDLDIRECT 94.0 01/18/2018   Lab Results  Component Value Date   CREATININE 0.69 01/18/2018   BUN 11 01/18/2018   NA 139 01/18/2018   K 4.1 01/18/2018   CL 103 01/18/2018   CO2 30 01/18/2018    The 10-year ASCVD risk score Denman George DC Jr., et al., 2013) is: 13.8%   Values used to calculate the score:     Age: 48 years     Sex: Female     Is Non-Hispanic African American: No     Diabetic: Yes     Tobacco smoker: No     Systolic Blood Pressure: 132 mmHg     Is BP treated: Yes     HDL Cholesterol: 45.3 mg/dL     Total Cholesterol: 181 mg/dL   Assessment  1. Type 2 diabetes mellitus without complication, without long-term current use of insulin (HCC)   2.  Essential hypertension      Plan   DM2:  Diet controlled. Defer a1c for 3 months given good control, clinically doing well and covid -19 pandemic. Continue diabetic diet. Get eye exam report. Continue ace. Need to consider a statin. Will discuss at upcoming physical.   HTN: has been well controlled. Reassured. Increase lisinopril to 10mg  daily and recheck with tele-visit in 4 weeks. Pt to continue home checks. Consider baby aspirin.  Time spent with the patient (non face-to-face time during this virtual encounter): 17 minutes, spent in obtaining information about her symptoms, reviewing her previous labs, evaluations, and treatments, counseling her about her condition (please see the discussed topics above), and developing a plan to further investigate it; the patient was provided an opportunity to ask questions and all were answered. The patient agreed with the plan and demonstrated an understanding of the instructions.   The patient was advised to call back or seek an in-person evaluation if the symptoms worsen or if the condition fails to improve as anticipated.  Follow up: Return in about 3 months (around 10/27/2018) for follow up Diabetes, complete physical.  Visit date not found  No orders of the defined types were placed in this encounter.  Meds ordered this encounter  Medications  . lisinopril (PRINIVIL,ZESTRIL) 5 MG tablet    Sig: Take 2 tablets (10 mg total) by mouth daily.    Dispense:  90 tablet    Refill:  1      I reviewed the patients updated PMH, FH, and SocHx.    Patient Active Problem List   Diagnosis Date Noted  . Essential hypertension 01/18/2018  . Type 2 diabetes mellitus without complication, without long-term current use of insulin (HCC) 01/18/2018  . Gallstones 08/04/2012   Current Meds  Medication Sig  . lisinopril (PRINIVIL,ZESTRIL) 5 MG tablet Take 2 tablets (10 mg total) by mouth daily.  . [DISCONTINUED] lisinopril (PRINIVIL,ZESTRIL) 5 MG tablet Take 1  tablet (5 mg total) by mouth daily.    Allergies: Patient is allergic to terconazole. Family History: Patient family history includes Alcohol abuse in her son; Arthritis in her mother; Asthma in her brother and father; Breast cancer in her maternal aunt; COPD in her father and sister; Diabetes in her mother and sister; Heart disease in her father; Rheum arthritis in her sister. Social History:  Patient  reports that she quit smoking about 27 years ago. She has never used smokeless tobacco. She reports that she does not drink alcohol or use drugs.  Review of Systems: Constitutional: Negative  for fever malaise or anorexia Cardiovascular: negative for chest pain Respiratory: negative for SOB or persistent cough Gastrointestinal: negative for abdominal pain

## 2018-08-18 ENCOUNTER — Telehealth: Payer: Self-pay | Admitting: Family Medicine

## 2018-08-22 ENCOUNTER — Other Ambulatory Visit: Payer: Self-pay | Admitting: Family Medicine

## 2018-08-22 MED ORDER — LISINOPRIL 10 MG PO TABS: 10.0000 mg | ORAL_TABLET | Freq: Every day | ORAL | 1 refills | Status: DC

## 2018-08-22 NOTE — Telephone Encounter (Signed)
See note

## 2018-08-22 NOTE — Telephone Encounter (Signed)
Pt called to see if medication dose on lisinopril is going to be increased. She has not picked up the 5mg  that was send 4/23. She has 10 doses of 5mg  left but is taking 2/day (as was indicated in OV 07/28/2018). Pt is wanting to know if she should go back to 5mg  or wait to fill until virtual visit 08/25/2018 to determine if she should continue 10mg /day. Please call to advise [phone]. [facility] [address] [place] [address] [phone] (Phone) [phone] (Fax)

## 2018-08-22 NOTE — Addendum Note (Signed)
Addended by: Erenest Blank on: 08/22/2018 10:59 AM   Modules accepted: Orders

## 2018-08-22 NOTE — Telephone Encounter (Signed)
Pt aware 10mg  RX sent.

## 2018-08-25 ENCOUNTER — Encounter: Payer: Self-pay | Admitting: Family Medicine

## 2018-08-25 ENCOUNTER — Other Ambulatory Visit: Payer: Self-pay

## 2018-08-25 ENCOUNTER — Ambulatory Visit (INDEPENDENT_AMBULATORY_CARE_PROVIDER_SITE_OTHER): Payer: PRIVATE HEALTH INSURANCE | Admitting: Family Medicine

## 2018-08-25 VITALS — BP 161/80 | HR 71 | Wt 184.0 lb

## 2018-08-25 DIAGNOSIS — I10 Essential (primary) hypertension: Secondary | ICD-10-CM | POA: Diagnosis not present

## 2018-08-25 DIAGNOSIS — E119 Type 2 diabetes mellitus without complications: Secondary | ICD-10-CM | POA: Diagnosis not present

## 2018-08-25 MED ORDER — LISINOPRIL 10 MG PO TABS: 20.0000 mg | ORAL_TABLET | Freq: Every day | ORAL | 1 refills | Status: DC

## 2018-08-25 NOTE — Assessment & Plan Note (Addendum)
Control still marginal. Increase lisinopril from 10mg  daily to 20mg  daily. Continue home monitoring and recheck in 4 weeks. Monitor for cough or side effects. May need additional agent if HTN persists.has f/u for cpe and blood work in July.

## 2018-08-25 NOTE — Progress Notes (Signed)
TELEPHONE ENCOUNTER   Patient verbally agreed to telephone visit and is aware that copayment and coinsurance may apply. Patient was treated using telemedicine according to accepted telemedicine protocols.  Location of the patient: home Location of provider: [facility], [place] office Names of all persons participating in the telemedicine service and role in the encounter: Willow Ora, MD Rita Ohara, CMA   Subjective  CC:  Chief Complaint  Patient presents with  . Hypertension    4 week f/u after starting lisinopril    HPI: Maria Watkins is a 63 y.o. female who was telephoned today to address the problems listed above in the chief complaint. Hypertension f/u: Control is fair . Pt reports she is doing well. taking medications as instructed, no medication side effects noted, no TIAs, no chest pain on exertion, no dyspnea on exertion, no swelling of ankles. bp 142/82 yesterday; keeping very active in the yard.  She denies adverse effects from his BP medications although she has noticed a slight minor cough and some flushing that she thinks could be related to working out in the yard and allergies. Compliance with medication is good.   BP Readings from Last 3 Encounters:  08/25/18 (!) 161/80  07/28/18 132/83  04/14/18 118/76   Wt Readings from Last 3 Encounters:  08/25/18 184 lb (83.5 kg)  07/28/18 184 lb (83.5 kg)  04/14/18 180 lb 3.2 oz (81.7 kg)    Lab Results  Component Value Date   CHOL 181 01/18/2018   Lab Results  Component Value Date   HDL 45.30 01/18/2018   No results found for: [facility] Lab Results  Component Value Date   TRIG 322.0 (H) 01/18/2018   Lab Results  Component Value Date   CHOLHDL 4 01/18/2018   Lab Results  Component Value Date   LDLDIRECT 94.0 01/18/2018   Lab Results  Component Value Date   CREATININE 0.69 01/18/2018   BUN 11 01/18/2018   NA 139 01/18/2018   K 4.1 01/18/2018   CL 103 01/18/2018   CO2 30  01/18/2018    The 10-year ASCVD risk score Denman George DC Jr., et al., 2013) is: 19.9%   Values used to calculate the score:     Age: 11 years     Sex: Female     Is Non-Hispanic African American: No     Diabetic: Yes     Tobacco smoker: No     Systolic Blood Pressure: 161 mmHg     Is BP treated: Yes     HDL Cholesterol: 45.3 mg/dL     Total Cholesterol: 181 mg/dL   ASSESSMENT: 1. Essential hypertension   2. Type 2 diabetes mellitus without complication, without long-term current use of insulin (HCC)      See below for problem based assessment and plan documentation  Time spent with the patient (non face-to-face time during this virtual encounter): 23 minutes, spent in obtaining information about her symptoms, reviewing her previous labs, evaluations, and treatments, counseling her about her condition (please see the discussed topics above), and developing a plan to further investigate it; the patient was provided an opportunity to ask questions and all were answered. The patient agreed with the plan and demonstrated an understanding of the instructions.   The patient was advised to call back or seek an in-person evaluation if the symptoms worsen or if the condition fails to improve as anticipated.  Follow up: Return in about 4 weeks (around 09/22/2018) for follow up Hypertension.  10/27/2018 for cpe and dm f/u  No orders of the defined types were placed in this encounter.  Meds ordered this encounter  Medications  . lisinopril (ZESTRIL) 10 MG tablet    Sig: Take 2 tablets (20 mg total) by mouth daily.    Dispense:  90 tablet    Refill:  1     I reviewed the patients updated PMH, FH, and SocHx.    Patient Active Problem List   Diagnosis Date Noted  . Essential hypertension 01/18/2018  . Type 2 diabetes mellitus without complication, without long-term current use of insulin (HCC) 01/18/2018  . Gallstones 08/04/2012   Current Meds  Medication Sig  . lisinopril (ZESTRIL) 10 MG  tablet Take 2 tablets (20 mg total) by mouth daily.  . [DISCONTINUED] lisinopril (ZESTRIL) 10 MG tablet Take 1 tablet (10 mg total) by mouth daily.    Allergies: Patient is allergic to terconazole. Family History: Patient family history includes Alcohol abuse in her son; Arthritis in her mother; Asthma in her brother and father; Breast cancer in her maternal aunt; COPD in her father and sister; Diabetes in her mother and sister; Heart disease in her father; Rheum arthritis in her sister. Social History:  Patient  reports that she quit smoking about 27 years ago. She has never used smokeless tobacco. She reports that she does not drink alcohol or use drugs.  Review of Systems: Constitutional: Negative for fever malaise or anorexia Cardiovascular: negative for chest pain Respiratory: negative for SOB or persistent cough Gastrointestinal: negative for abdominal pain  Vitals:   08/25/18 0903  BP: (!) 161/80  Pulse: 71

## 2018-08-25 NOTE — Assessment & Plan Note (Signed)
Doing fine with diet alone. Discussed increased ascvd risk today and would like to add statin in the near future. Pt defers today while we are changing her bp medications. Will keep educating. Has f/u for a1c and dm in July,

## 2018-08-29 ENCOUNTER — Ambulatory Visit: Payer: Self-pay | Admitting: *Deleted

## 2018-08-29 ENCOUNTER — Telehealth: Payer: Self-pay | Admitting: *Deleted

## 2018-08-29 ENCOUNTER — Encounter: Payer: Self-pay | Admitting: *Deleted

## 2018-08-29 DIAGNOSIS — I10 Essential (primary) hypertension: Secondary | ICD-10-CM

## 2018-08-29 MED ORDER — LISINOPRIL 10 MG PO TABS: 10.0000 mg | ORAL_TABLET | Freq: Every day | ORAL | 1 refills | Status: DC

## 2018-08-29 NOTE — Telephone Encounter (Signed)
See below

## 2018-08-29 NOTE — Telephone Encounter (Signed)
Patient had visit last week-  Patient thinks she had machine problem- they have put new batteries in and read instructions on proper placement.  Patient  has been continued taking the 10 mg of the Lisinopril- she felt her machine was wrong and she was afraid to make her BP go too low.  Patient readings:  5/4-  128/79 P 67 5/3 Am 128/79 P 68 Pm  134/80 P 81 5/2 PM 129/86 P 79 HS  136/78 P 71  Patient states the highest reading she has gotten had been 140/90 and that was after working outside.   Patient states Dr Mardelle Matte can send her message on MyChart if needed- she may need perimeters about when to call if her BP should get too high.    Reason for Disposition . Caller has NON-URGENT medication question about med that PCP prescribed and triager unable to answer question  Protocols used: MEDICATION QUESTION CALL-A-AH

## 2018-08-29 NOTE — Telephone Encounter (Signed)
See note

## 2018-08-29 NOTE — Telephone Encounter (Signed)
Maria Watkins has continued taking the 10 mg of the Lisinopril- she felt her machine was wrong and she was afraid to make her BP go too low. Her readings are:  5/4-  128/79 P 67 5/3 Am 128/79 P 68 Pm  134/80 P 81 5/2 PM 129/86 P 79 HS  136/78 P 71  Pt aware of recommendations from Dr. Mardelle Matte.  Please call her: those numbers look fine.  Goal bp 120s/70s.  Would recommend increasing the lisinopril to 20mg  daily IF she is getting 135/85 or greater consistently. Otherwise, if bp is consistetnly < 135/85, can stay on the 10mg  daily.   Please document her today bp in her chart and update her med list to reflect she is on the 10mg  daily. thanks

## 2018-08-29 NOTE — Telephone Encounter (Signed)
Summary: medication questions   Pt stated she has some questions she needs to ask the nurse regarding some medications. Pt requests a call back. Cb# [phone]     Attempted to call patient- no answer

## 2018-08-29 NOTE — Telephone Encounter (Signed)
Please call her: those numbers look fine.  Goal bp 120s/70s.  Would recommend increasing the lisinopril to 20mg  daily IF she is getting 135/85 or greater consistently. Otherwise, if bp is consistetnly < 135/85, can stay on the 10mg  daily.   Please document her today bp in her chart and update her med list to reflect she is on the 10mg  daily. thanks

## 2018-08-31 ENCOUNTER — Other Ambulatory Visit: Payer: Self-pay

## 2018-08-31 ENCOUNTER — Encounter: Payer: Self-pay | Admitting: Family Medicine

## 2018-08-31 ENCOUNTER — Telehealth: Payer: Self-pay | Admitting: Family Medicine

## 2018-08-31 ENCOUNTER — Ambulatory Visit (INDEPENDENT_AMBULATORY_CARE_PROVIDER_SITE_OTHER): Payer: PRIVATE HEALTH INSURANCE | Admitting: Family Medicine

## 2018-08-31 DIAGNOSIS — J301 Allergic rhinitis due to pollen: Secondary | ICD-10-CM

## 2018-08-31 MED ORDER — FLUTICASONE PROPIONATE 50 MCG/ACT NA SUSP: 1.0000 | Freq: Every day | NASAL | 2 refills | Status: DC

## 2018-08-31 NOTE — Telephone Encounter (Signed)
Do you want to have a virtual visit scheduled?

## 2018-08-31 NOTE — Telephone Encounter (Signed)
Yes please. I think she can only do telephone

## 2018-08-31 NOTE — Telephone Encounter (Signed)
See note, patient was just seen on 08/25/18 for BP appointment.   Copied from CRM [id number]. Topic: General - Other >> Aug 31, 2018  8:16 AM Jaquita Rector A wrote: Reason for CRM: Patient called to say that she is experiencing sore throat and both ears are are aching and feel kind of itchy on the inside. Asking if there is possible something that can be done to aide this. Right ear more sore that the left at times. Please call Ph# [phone]

## 2018-08-31 NOTE — Telephone Encounter (Signed)
Pt scheduled for 140p today.

## 2018-08-31 NOTE — Progress Notes (Signed)
   TELEPHONE ENCOUNTER   Patient verbally agreed to telephone visit and is aware that copayment and coinsurance may apply. Patient was treated using telemedicine according to accepted telemedicine protocols.  Location of the patient: home Location of provider: [facility], [place] office Names of all persons participating in the telemedicine service and role in the encounter: Willow Ora, MD Rita Ohara, CMA   Subjective  CC:  Chief Complaint  Patient presents with  . Sore Throat    Started few days ago.. She reports she has been outside doing yardwork without a mask.. Worse at night.. Has tried Tylenol  . Ear Pain    Bilateral    HPI: Maria Watkins is a 63 y.o. female who was telephoned today to address the problems listed above in the chief complaint.  Body complains of sore throat, postnasal drainage, occasional sneezing, ear pressure with ear popping: All symptoms that are consistent with allergic rhinitis.  She was working out in the yard recently when this all started.  She denies fevers, chills, sweats, cough, shortness of breath.  Pain is described as mild to moderate.  She is used Tylenol without significant relief.  No hearing problem.  No chest congestion. ASSESSMENT: 1. Seasonal allergic rhinitis due to pollen      SAR: education given on treatment options. Start allergy medications. Supportive care. See AVS.  Time spent with the patient (non face-to-face time during this virtual encounter): 12 minutes, spent in obtaining information about her symptoms, reviewing her previous labs, evaluations, and treatments, counseling her about her condition (please see the discussed topics above), and developing a plan to further investigate it; the patient was provided an opportunity to ask questions and all were answered. The patient agreed with the plan and demonstrated an understanding of the instructions.   The patient was advised to call back or seek an  in-person evaluation if the symptoms worsen or if the condition fails to improve as anticipated.  Follow up: Return for as scheduled.  09/22/2018  No orders of the defined types were placed in this encounter.  Meds ordered this encounter  Medications  . fluticasone (FLONASE) 50 MCG/ACT nasal spray    Sig: Place 1 spray into both nostrils daily.    Dispense:  16 g    Refill:  2     I reviewed the patients updated PMH, FH, and SocHx.    Patient Active Problem List   Diagnosis Date Noted  . Essential hypertension 01/18/2018  . Type 2 diabetes mellitus without complication, without long-term current use of insulin (HCC) 01/18/2018  . Gallstones 08/04/2012   Current Meds  Medication Sig  . lisinopril (ZESTRIL) 10 MG tablet Take 1 tablet (10 mg total) by mouth daily.    Allergies: Patient is allergic to terconazole. Family History: Patient family history includes Alcohol abuse in her son; Arthritis in her mother; Asthma in her brother and father; Breast cancer in her maternal aunt; COPD in her father and sister; Diabetes in her mother and sister; Heart disease in her father; Rheum arthritis in her sister. Social History:  Patient  reports that she quit smoking about 27 years ago. She has never used smokeless tobacco. She reports that she does not drink alcohol or use drugs.  Review of Systems: Constitutional: Negative for fever malaise or anorexia Cardiovascular: negative for chest pain Respiratory: negative for SOB or persistent cough Gastrointestinal: negative for abdominal pain

## 2018-08-31 NOTE — Patient Instructions (Addendum)
Please follow up as scheduled for your next visit with me: 09/22/2018 for blood pressure recheck.  If you have any questions or concerns, please don't hesitate to send me a message via MyChart or call the office at [phone]. Thank you for visiting with us today! It's our pleasure caring for you.   Please start an claritin and allegra or zyrtec once daily and use the flonase nasal spray daily. You may use advil for your sore throat as well.  Allergic Rhinitis, Adult Allergic rhinitis is an allergic reaction that affects the mucous membrane inside the nose. It causes sneezing, a runny or stuffy nose, and the feeling of mucus going down the back of the throat (postnasal drip). Allergic rhinitis can be mild to severe. There are two types of allergic rhinitis:  Seasonal. This type is also called hay fever. It happens only during certain seasons.  Perennial. This type can happen at any time of the year. What are the causes? This condition happens when the body's defense system (immune system) responds to certain harmless substances called allergens as though they were germs.  Seasonal allergic rhinitis is triggered by pollen, which can come from grasses, trees, and weeds. Perennial allergic rhinitis may be caused by:  House dust mites.  Pet dander.  Mold spores. What are the signs or symptoms? Symptoms of this condition include:  Sneezing.  Runny or stuffy nose (nasal congestion).  Postnasal drip.  Itchy nose.  Tearing of the eyes.  Trouble sleeping.  Daytime sleepiness. How is this diagnosed? This condition may be diagnosed based on:  Your medical history.  A physical exam.  Tests to check for related conditions, such as: ? Asthma. ? Pink eye. ? Ear infection. ? Upper respiratory infection.  Tests to find out which allergens trigger your symptoms. These may include skin or blood tests. How is this treated? There is no cure for this condition, but treatment can help  control symptoms. Treatment may include:  Taking medicines that block allergy symptoms, such as antihistamines. Medicine may be given as a shot, nasal spray, or pill.  Avoiding the allergen.  Desensitization. This treatment involves getting ongoing shots until your body becomes less sensitive to the allergen. This treatment may be done if other treatments do not help.  If taking medicine and avoiding the allergen does not work, new, stronger medicines may be prescribed. Follow these instructions at home:  Find out what you are allergic to. Common allergens include smoke, dust, and pollen.  Avoid the things you are allergic to. These are some things you can do to help avoid allergens: ? Replace carpet with wood, tile, or vinyl flooring. Carpet can trap dander and dust. ? Do not smoke. Do not allow smoking in your home. ? Change your heating and air conditioning filter at least once a month. ? During allergy season:  Keep windows closed as much as possible.  Plan outdoor activities when pollen counts are lowest. This is usually during the evening hours.  When coming indoors, change clothing and shower before sitting on furniture or bedding.  Take over-the-counter and prescription medicines only as told by your health care provider.  Keep all follow-up visits as told by your health care provider. This is important. Contact a health care provider if:  You have a fever.  You develop a persistent cough.  You make whistling sounds when you breathe (you wheeze).  Your symptoms interfere with your normal daily activities. Get help right away if:  You have  shortness of breath. Summary  This condition can be managed by taking medicines as directed and avoiding allergens.  Contact your health care provider if you develop a persistent cough or fever.  During allergy season, keep windows closed as much as possible. This information is not intended to replace advice given to you by  your health care provider. Make sure you discuss any questions you have with your health care provider. Document Released: 01/06/2001 Document Revised: 05/21/2016 Document Reviewed: 05/21/2016 Elsevier Interactive Patient Education  2019 [employer].

## 2018-09-06 ENCOUNTER — Telehealth: Payer: Self-pay | Admitting: Family Medicine

## 2018-09-06 NOTE — Telephone Encounter (Signed)
Copied from CRM [id number]. Topic: Appointment Scheduling - Transfer of Care >> Sep 06, 2018  2:13 PM [name] A wrote: Pt is requesting to transfer FROM: Maria Watkins  Pt is requesting to transfer TO: [facility]  Reason for requested transfer: Due to location, pt would like to transfer do to the new location   Send CRM to patient's current PCP (transferring FROM).

## 2018-09-06 NOTE — Telephone Encounter (Signed)
Transfer ok with me. Maria Watkins, Please place her in new pt slot (30 min) and she can establish when she is due for her next follow up on her chronic conditions.

## 2018-09-07 NOTE — Telephone Encounter (Signed)
Sent to [facility] front staff to be scheduled as NP

## 2018-09-07 NOTE — Telephone Encounter (Signed)
Ok to transfer. thanks

## 2018-09-08 NOTE — Telephone Encounter (Signed)
Spoke with patient. Appt scheduled on 10/12/18.Marland KitchenMarland Kitchen

## 2018-09-16 ENCOUNTER — Telehealth: Payer: Self-pay | Admitting: Family Medicine

## 2018-09-16 NOTE — Telephone Encounter (Signed)
Can offer virtual with Jimmey Ralph tomorrow

## 2018-09-16 NOTE — Telephone Encounter (Signed)
Copied from CRM [id number]. Topic: Appointment Scheduling - Scheduling Inquiry for Clinic >> Sep 16, 2018  3:37 PM Deborha Payment wrote: Reason for CRM: Patient would like to know if she could get an appt with PCP. She does have a transfer of care appt on 6/17 with Kuneff,DO. Claiborne Billings will be on vacation. And Patient would like to be seen ASAP for sinus issues.  Technically patient PCP is with Mardelle Matte, MD. Patient call back # [phone]

## 2018-09-17 ENCOUNTER — Other Ambulatory Visit: Payer: Self-pay

## 2018-09-17 ENCOUNTER — Ambulatory Visit (INDEPENDENT_AMBULATORY_CARE_PROVIDER_SITE_OTHER): Payer: PRIVATE HEALTH INSURANCE | Admitting: Family Medicine

## 2018-09-17 ENCOUNTER — Encounter: Payer: Self-pay | Admitting: Family Medicine

## 2018-09-17 VITALS — BP 149/89 | HR 73 | Ht 63.0 in | Wt 184.0 lb

## 2018-09-17 DIAGNOSIS — J329 Chronic sinusitis, unspecified: Secondary | ICD-10-CM | POA: Diagnosis not present

## 2018-09-17 MED ORDER — AZELASTINE HCL 0.1 % NA SOLN: 2.0000 | Freq: Two times a day (BID) | NASAL | 12 refills | Status: DC

## 2018-09-17 MED ORDER — AMOXICILLIN 400 MG/5ML PO SUSR: 800.0000 mg | Freq: Two times a day (BID) | ORAL | 0 refills | Status: DC

## 2018-09-17 NOTE — Progress Notes (Signed)
   ThatChief Complaint:  Maria Watkins is a 63 y.o. female who presents for a telephone visit with a chief complaint of sinusitis.   Assessment/Plan:  Sinusitis Given that symptoms have been persistent for the past 2 to 3 weeks, will start antibiotics.  Patient request liquid amoxicillin.  This was sent in for 10-day course.  We will also start Astelin nasal spray.  Encouraged good oral hydration.  She can continue taking over-the-counter analgesics as needed.  Discussed reasons to return to care.  Follow-up as needed.   Subjective:  HPI:  Sinusitis  Symptoms started about 3 weeks ago. Associated with sore throat and left ear pain/pressure. She was prescribed flonase however was not able to pick it up due to the pharmacy not having in stock. She also started claritin which has not helped with her symptoms. Her throat has since stopped hurting, however she has had worsening facial pain and dental pain. Pain is worse with certain positions and with pressing on her face.  No fevers or chills.  She called her dentist due to concern for dental infection however she is not having any dental pain or gum swelling.  She has tried taking Tylenol and ibuprofen with modest improvement.  No other treatments tried.  ROS: Per HPI  PMH: She reports that she quit smoking about 27 years ago. She has never used smokeless tobacco. She reports that she does not drink alcohol or use drugs.      Objective/Observations   NAD, speaking in full sentences.   Telephone Visit   I connected with Maria Watkins on 09/17/18 at  9:20 AM EDT via telephone and verified that I am speaking with the correct person using two identifiers. I discussed the limitations of evaluation and management by telemedicine and the availability of in person appointments. The patient expressed understanding and agreed to proceed.   Patient location: Home Provider location: [place] [employer] Persons participating in the  virtual visit: Myself and Patient  A total of 13 minutes were spent on medical discussion.      Maria Watkins. Jimmey Ralph, MD 09/17/2018 9:24 AM

## 2018-09-22 ENCOUNTER — Ambulatory Visit: Payer: PRIVATE HEALTH INSURANCE | Admitting: Family Medicine

## 2018-10-12 ENCOUNTER — Encounter: Payer: PRIVATE HEALTH INSURANCE | Admitting: Family Medicine

## 2018-10-20 ENCOUNTER — Telehealth: Payer: Self-pay

## 2018-10-20 NOTE — Telephone Encounter (Signed)
Recommend OV.  Algis Greenhouse. Jerline Pain, MD 10/20/2018 4:32 PM

## 2018-10-20 NOTE — Telephone Encounter (Signed)
Please advise 

## 2018-10-20 NOTE — Telephone Encounter (Signed)
Pt is TOC from Dr. Jonni Sanger, to another office.. Since Dr. Jerline Pain gave medication last ask him for a renewal or if an OV is needed

## 2018-10-20 NOTE — Telephone Encounter (Signed)
Copied from CRM #[id number]. Topic: General - Other >> Oct 20, 2018 12:21 PM Harris, Maria Watkins wrote: Reason for CRM: Patient called to ask if an antibiotic that she had gotten from Dr. Parker a little while ago, could be called in for her.  She stated that it helped before and she has run out.  Please advise and call her if this is possible.  CB# [phone] 

## 2018-10-20 NOTE — Telephone Encounter (Signed)
Copied from [place] [id number]. Topic: General - Other >> Oct 20, 2018 12:21 PM Keene Breath wrote: Reason for CRM: Patient called to ask if an antibiotic that she had gotten from Dr. Jerline Pain a little while ago, could be called in for her.  She stated that it helped before and she has run out.  Please advise and call her if this is possible.  CB# [phone]

## 2018-10-21 NOTE — Telephone Encounter (Signed)
Left detailed voice message stating an OV is needed.

## 2018-10-26 ENCOUNTER — Telehealth: Payer: Self-pay | Admitting: Family Medicine

## 2018-10-26 ENCOUNTER — Other Ambulatory Visit: Payer: Self-pay

## 2018-10-26 ENCOUNTER — Ambulatory Visit (INDEPENDENT_AMBULATORY_CARE_PROVIDER_SITE_OTHER): Payer: PPO | Admitting: Family Medicine

## 2018-10-26 ENCOUNTER — Encounter: Payer: Self-pay | Admitting: Family Medicine

## 2018-10-26 VITALS — BP 158/90 | HR 72 | Temp 98.6°F | Ht 63.0 in | Wt 183.0 lb

## 2018-10-26 DIAGNOSIS — J301 Allergic rhinitis due to pollen: Secondary | ICD-10-CM

## 2018-10-26 MED ORDER — FLUTICASONE PROPIONATE 50 MCG/ACT NA SUSP: 2.0000 | Freq: Every day | NASAL | 6 refills | Status: DC

## 2018-10-26 MED ORDER — CETIRIZINE HCL 10 MG PO TABS: 10.0000 mg | ORAL_TABLET | Freq: Every day | ORAL | 11 refills | Status: DC

## 2018-10-26 NOTE — Telephone Encounter (Signed)
Ptt can schedule a VV for congestion (respiratory sx of COVID) and keep transfer appoint on 11/10/18. This is to ensure her safety as well as the safety of other patients and staff. I hope she is understanding.   Symptoms may appear 2-14 days after exposure to the virus. People with these symptoms may have COVID-19:  Fever or chills  Cough  Shortness of breath or difficulty breathing  Fatigue  Muscle or body aches  Headache  New loss of taste or smell  Sore throat  Congestion or runny nose  Nausea or vomiting  Diarrhea

## 2018-10-26 NOTE — Telephone Encounter (Signed)
Called patient and offered virtual appt today at 4:15pm. She declined virtual, She can do telephone only. She sates that she does not have a smart cell phone nor does she have Internet.

## 2018-10-26 NOTE — Telephone Encounter (Signed)
Pt saw Dr Jerline Pain for symptoms 09/17/2018, tx with abx. Asked for abx again 10/20/2018 and was told to make OV. Please advise if pt can come to office as long as has no fever, SOB, or other COVID symptoms beside the congestion? NP appt scheduled 11/10/2018 with you.   Please advise

## 2018-10-26 NOTE — Progress Notes (Signed)
VISIT VIA telephone  I connected with Maria Watkins on 10/26/18 at  4:15 PM EDT via telephone and verified that I am speaking with the correct person using two identifiers. Location patient: Home Location provider: [facility], Office Persons participating in the virtual visit: Patient, Dr. Raoul Pitch and R.Baker, LPN  I discussed the limitations of evaluation and management by telemedicine and the availability of in person appointments. The patient expressed understanding and agreed to proceed.   Maria Watkins , 09-05-1955, 63 y.o., female MRN: [id number] Patient Care Team    Relationship Specialty Notifications Start End  Leamon Arnt, MD PCP - General Family Medicine  01/18/18   Juanita Craver, MD Consulting Physician Gastroenterology  01/18/18   Jerelyn Charles, MD Consulting Physician Obstetrics  01/18/18     Chief Complaint  Patient presents with   Nasal Congestion    Pt started having nasal congestion and pain in jaw bone several weeks ago. Pt took abx that was called in by Dr Jerline Pain. Sinus congestion came back x2 days ago. Pt has been doing things in the yard and is waking up with the congestion but gets better during the day. Pt took BP medications 1 hour ago.      Subjective: Pt presents for an OV with complaints of nasal congestion of 2 days duration.  Associated symptoms include waking up with congestion that gets better later in the day. Congestion when working in the yard.  She states she did cough up a small amount of yellow phlegm today.  Otherwise denies cough. She was treated for a sinus infection 09/17/2018 with amox x 10 days. She reports she has been taking claritin and Astelin nasal spray as well.  She endorses relief with her symptoms after completion of amoxicillin.  At that time she was having some sinus pain and pressure which has resolved with antibiotics.  He denies any fever, chills, nausea, vomit, sinus pain or teeth pain.   Depression screen [facility]  2/9 07/28/2018  Decreased Interest 0  Down, Depressed, Hopeless 0  PHQ - 2 Score 0    Allergies  Allergen Reactions   Terconazole Nausea Only   Social History   Social History Narrative   Not on file   Past Medical History:  Diagnosis Date   Abnormal Pap smear of cervix 1980's   resolved post cryo with normal follow up   Depression    Hypertension    Type 2 diabetes mellitus without complication, without long-term current use of insulin ([place]) 01/18/2018   Past Surgical History:  Procedure Laterality Date   CERVIX LESION DESTRUCTION  1980s   COLPOSCOPY  32's   Family History  Problem Relation Age of Onset   Diabetes Mother    Arthritis Mother    COPD Father    Asthma Father    Heart disease Father    Diabetes Sister    Asthma Brother    COPD Sister    Rheum arthritis Sister    Breast cancer Maternal Aunt    Alcohol abuse Son    Allergies as of 10/26/2018      Reactions   Terconazole Nausea Only      Medication List       Accurate as of October 26, 2018  4:26 PM. If you have any questions, ask your nurse or doctor.        STOP taking these medications   loratadine 10 MG tablet Commonly known as: CLARITIN Stopped by: Joseph Art  Lovene Maret, DO     TAKE these medications   azelastine 0.1 % nasal spray Commonly known as: ASTELIN Place 2 sprays into both nostrils 2 (two) times daily. Use in each nostril as directed   lisinopril 10 MG tablet Commonly known as: ZESTRIL Take 1 tablet (10 mg total) by mouth daily.       All past medical history, surgical history, allergies, family history, immunizations andmedications were updated in the EMR today and reviewed under the history and medication portions of their EMR.     ROS: Negative, with the exception of above mentioned in HPI   Objective:  BP (!) 158/90    Pulse 72    Temp 98.6 F (37 C) (Oral)    Ht 5\' 3"  (1.6 m)    Wt 183 lb (83 kg)    LMP 04/27/2005 (Approximate)    BMI 32.42 kg/m  Body  mass index is 32.42 kg/m. Gen: Afebrile. No acute distress. Nontoxic.  Very pleasant female. Neck/lymp/endocrine: Supple, no lymphadenopathy reported CV: No edema reported Chest: No cough or shortness of breath during visit Neuro:   Alert. Oriented x3  Psych: Normal affect, dress and demeanor. Normal speech. Normal thought content and judgment.  No exam data present No results found. No results found for this or any previous visit (from the past 24 hour(s)).  Assessment/Plan: Maria Watkins is a 63 y.o. female present for OV for  Seasonal allergic rhinitis due to pollen Lengthy discussion today surrounding her signs and symptoms seem to be allergy related.  She has no infectious signs currently.  Discussed keeping her allergies well controlled will help discourage an onset of a sinus infection. Discontinue Claritin.  Prescribed Zyrtec nightly Continue Astelin. Restart Flonase-prescribed for her. Consider trial of nasal saline or Nettie pot daily.   Change air filters routinely every 3-4 months, consider using allergy air filters. Continue establishment/transfer care appointment in 2 weeks.    Reviewed expectations re: course of current medical issues.  Discussed self-management of symptoms.  Outlined signs and symptoms indicating need for more acute intervention.  Patient verbalized understanding and all questions were answered.  Patient received an After-Visit Summary.    No orders of the defined types were placed in this encounter. > 15 minutes spent with patient, > 50% of that time face to face   Note is dictated utilizing voice recognition software. Although note has been proof read prior to signing, occasional typographical errors still can be missed. If any questions arise, please do not hesitate to call for verification.   electronically signed by:  Felix Pacinienee [place], DO  [facility] - [place]

## 2018-10-26 NOTE — Telephone Encounter (Signed)
Patient is requesting refill Amoxicillin. Dr. Jerline Pain wrote the first Rx. When she works in her yard she develops congestion. This morning  her phlegm was yellow. Patient is okay with moving TOC appointment earlier. Is it okay to do an in office visit for patient's symptoms?

## 2018-10-27 ENCOUNTER — Encounter: Payer: Self-pay | Admitting: Family Medicine

## 2018-11-10 ENCOUNTER — Other Ambulatory Visit: Payer: Self-pay

## 2018-11-10 ENCOUNTER — Encounter: Payer: Self-pay | Admitting: Family Medicine

## 2018-11-10 ENCOUNTER — Ambulatory Visit (INDEPENDENT_AMBULATORY_CARE_PROVIDER_SITE_OTHER): Payer: PPO | Admitting: Family Medicine

## 2018-11-10 VITALS — BP 135/81 | HR 73 | Temp 98.0°F | Resp 18 | Ht 62.0 in | Wt 184.5 lb

## 2018-11-10 DIAGNOSIS — S30861A Insect bite (nonvenomous) of abdominal wall, initial encounter: Secondary | ICD-10-CM

## 2018-11-10 DIAGNOSIS — W57XXXA Bitten or stung by nonvenomous insect and other nonvenomous arthropods, initial encounter: Secondary | ICD-10-CM

## 2018-11-10 DIAGNOSIS — E669 Obesity, unspecified: Secondary | ICD-10-CM | POA: Diagnosis not present

## 2018-11-10 DIAGNOSIS — E119 Type 2 diabetes mellitus without complications: Secondary | ICD-10-CM | POA: Diagnosis not present

## 2018-11-10 DIAGNOSIS — J309 Allergic rhinitis, unspecified: Secondary | ICD-10-CM | POA: Insufficient documentation

## 2018-11-10 DIAGNOSIS — I10 Essential (primary) hypertension: Secondary | ICD-10-CM | POA: Diagnosis not present

## 2018-11-10 DIAGNOSIS — J301 Allergic rhinitis due to pollen: Secondary | ICD-10-CM | POA: Diagnosis not present

## 2018-11-10 LAB — CBC WITH DIFFERENTIAL/PLATELET
Basophils Absolute: 0.1 10*3/uL (ref 0.0–0.1)
Basophils Relative: 1.5 % (ref 0.0–3.0)
Eosinophils Absolute: 0.1 10*3/uL (ref 0.0–0.7)
Eosinophils Relative: 2 % (ref 0.0–5.0)
HCT: 40.2 % (ref 36.0–46.0)
Hemoglobin: 13.4 g/dL (ref 12.0–15.0)
Lymphocytes Relative: 41.2 % (ref 12.0–46.0)
Lymphs Abs: 2.2 10*3/uL (ref 0.7–4.0)
MCHC: 33.5 g/dL (ref 30.0–36.0)
MCV: 87.6 fl (ref 78.0–100.0)
Monocytes Absolute: 0.4 10*3/uL (ref 0.1–1.0)
Monocytes Relative: 7.9 % (ref 3.0–12.0)
Neutro Abs: 2.5 10*3/uL (ref 1.4–7.7)
Neutrophils Relative %: 47.4 % (ref 43.0–77.0)
Platelets: 149 10*3/uL — ABNORMAL LOW (ref 150.0–400.0)
RBC: 4.59 Mil/uL (ref 3.87–5.11)
RDW: 13.1 % (ref 11.5–15.5)
WBC: 5.3 10*3/uL (ref 4.0–10.5)

## 2018-11-10 LAB — POCT GLYCOSYLATED HEMOGLOBIN (HGB A1C)
HbA1c POC (<> result, manual entry): 6.5 % (ref 4.0–5.6)
HbA1c, POC (controlled diabetic range): 6.5 % (ref 0.0–7.0)
HbA1c, POC (prediabetic range): 6.5 % — AB (ref 5.7–6.4)
Hemoglobin A1C: 6.5 % — AB (ref 4.0–5.6)

## 2018-11-10 LAB — LIPID PANEL
Cholesterol: 165 mg/dL (ref 0–200)
HDL: 46.3 mg/dL (ref >39.00)
LDL Cholesterol: 79 mg/dL (ref 0–99)
NonHDL: 118.7
Total CHOL/HDL Ratio: 4
Triglycerides: 198 mg/dL — ABNORMAL HIGH (ref 0.0–149.0)
VLDL: 39.6 mg/dL (ref 0.0–40.0)

## 2018-11-10 LAB — COMPREHENSIVE METABOLIC PANEL
ALT: 12 U/L (ref 0–35)
AST: 13 U/L (ref 0–37)
Albumin: 4.4 g/dL (ref 3.5–5.2)
Alkaline Phosphatase: 132 U/L — ABNORMAL HIGH (ref 39–117)
BUN: 11 mg/dL (ref 6–23)
CO2: 28 mEq/L (ref 19–32)
Calcium: 9.2 mg/dL (ref 8.4–10.5)
Chloride: 104 mEq/L (ref 96–112)
Creatinine, Ser: 0.67 mg/dL (ref 0.40–1.20)
GFR: 88.33 mL/min (ref >60.00)
Glucose, Bld: 126 mg/dL — ABNORMAL HIGH (ref 70–99)
Potassium: 4.1 mEq/L (ref 3.5–5.1)
Sodium: 140 mEq/L (ref 135–145)
Total Bilirubin: 1 mg/dL (ref 0.2–1.2)
Total Protein: 7.3 g/dL (ref 6.0–8.3)

## 2018-11-10 LAB — TSH: TSH: 0.43 u[IU]/mL (ref 0.35–4.50)

## 2018-11-10 MED ORDER — LISINOPRIL 10 MG PO TABS: 10.0000 mg | ORAL_TABLET | Freq: Every day | ORAL | 1 refills | Status: DC

## 2018-11-10 NOTE — Progress Notes (Signed)
Patient ID: Maria Watkins, female  DOB: 01-Apr-1956, 63 y.o.   MRN: [id number] Patient Care Team    Relationship Specialty Notifications Start End  Ma Hillock, DO PCP - General Family Medicine  10/27/18   Juanita Craver, MD Consulting Physician Gastroenterology  01/18/18   Jerelyn Charles, MD Consulting Physician Obstetrics  01/18/18   Marica Otter, [place]  [facility]  11/10/18     Chief Complaint  Patient presents with  . Transitions Of Care    Dr Jonni Sanger was previous MD. Pt needs [facility] appt and medication refills. Takes BP medications in the AM and checks BP at home. Has log of checks.     Subjective:  Maria Watkins is a 63 y.o.  female present for TOC.  All past medical history, surgical history, allergies, family history, immunizations, medications and social history were updated in the electronic medical record today. All recent labs, ED visits and hospitalizations within the last year were reviewed.  Diet controlled diabetes: She has always been diet controlled with her diabetes.  Denies numbness, tingling of extremities, hypo/hyperglycemic events or non-healing wounds.  He does not check her blood sugars at home.   PNA series: PNA23 04/14/2018- pneumovax die 03/2019 Flu shot: (recommneded yearly) BMP: Collected today. Foot exam: Completed 01/18/2018 Eye exam: 11/2017, patient is scheduling an appointment for this year and transferring back to her ophthalmologist Dr. Marica Otter. A1c: 6.7>> 6.7>> 6.3>> 6.5 today  Essential hypertension/obesity Pt reports compliance with lisinopril 10 mg daily. Blood pressures ranges at home normal-reviewed home readings with her today. Patient denies chest pain, shortness of breath or lower extremity edema.  BMP: Collected today CBC: Collected today Diet: Regular Exercise: Routine exercise RF: Hypertension, diabetes, obesity, former smoker, family history of heart disease  Seasonal allergies/seasonal allergies: Patient was seen few weeks ago  for worsening seasonal allergies.  Regimen was changed to Zyrtec, Flonase, Astelin nasal spray and she reports much improved symptoms. Prior note: Pt presents for an OV with complaints of nasal congestion of 2 days duration.  Associated symptoms include waking up with congestion that gets better later in the day. Congestion when working in the yard.  She states she did cough up a small amount of yellow phlegm today.  Otherwise denies cough. She was treated for a sinus infection 09/17/2018 with amox x 10 days. She reports she has been taking claritin and Astelin nasal spray as well.  She endorses relief with her symptoms after completion of amoxicillin.  At that time she was having some sinus pain and pressure which has resolved with antibiotics.  He denies any fever, chills, nausea, vomit, sinus pain or teeth pain.   Tick bite: Patient reports she sustained a tick bite 4 days ago.  She did have to have her husband remove the tick.  The tick was attached.  They placed a alcohol soaked cotton swab over and take detached without needing to pull.  She states the tick was on there less than 24 hours.  She denies any fever, chills, nausea, vomit or headache.  She reports no rash other than the area surrounding the tick bite is still red.  She has been placing antibiotic ointment and a Band-Aid over daily.  Depression screen [facility] 2/9 07/28/2018  Decreased Interest 0  Down, Depressed, Hopeless 0  PHQ - 2 Score 0   No flowsheet data found.     Fall Risk  07/28/2018  Falls in the past year? 0  Number falls in  past yr: 0  Injury with Fall? 0  Follow up Falls evaluation completed    Immunization History  Administered Date(s) Administered  . Influenza,inj,Quad PF,6+ Mos 01/18/2018  . Pneumococcal Polysaccharide-23 04/14/2018  . Tdap 04/27/2013    No exam data present  Past Medical History:  Diagnosis Date  . Abnormal Pap smear of cervix 1980's   resolved post cryo with normal follow up  . Depression    . Gallstones 08/04/2012  . Hypertension   . Type 2 diabetes mellitus without complication, without long-term current use of insulin ([place]) 01/18/2018   Allergies  Allergen Reactions  . Terconazole Nausea Only   Past Surgical History:  Procedure Laterality Date  . CERVIX LESION DESTRUCTION  1980s  . COLPOSCOPY  1980's   Family History  Problem Relation Age of Onset  . Diabetes Mother   . Arthritis Mother   . COPD Father   . Asthma Father   . Heart disease Father   . Diabetes Sister   . Asthma Brother   . COPD Sister   . Rheum arthritis Sister   . Breast cancer Maternal Aunt   . Alcohol abuse Son    Social History   Social History Narrative  . Not on file    Allergies as of 11/10/2018      Reactions   Terconazole Nausea Only      Medication List       Accurate as of November 10, 2018  6:02 PM. If you have any questions, ask your nurse or doctor.        azelastine 0.1 % nasal spray Commonly known as: ASTELIN Place 2 sprays into both nostrils 2 (two) times daily. Use in each nostril as directed   cetirizine 10 MG tablet Commonly known as: ZYRTEC Take 1 tablet (10 mg total) by mouth daily.   fluticasone 50 MCG/ACT nasal spray Commonly known as: FLONASE Place 2 sprays into both nostrils daily.   lisinopril 10 MG tablet Commonly known as: ZESTRIL Take 1 tablet (10 mg total) by mouth daily.       All past medical history, surgical history, allergies, family history, immunizations andmedications were updated in the EMR today and reviewed under the history and medication portions of their EMR.    Recent Results (from the past 2160 hour(s))  POCT glycosylated hemoglobin (Hb A1C)     Status: Abnormal   Collection Time: 11/10/18 10:46 AM  Result Value Ref Range   Hemoglobin A1C 6.5 (A) 4.0 - 5.6 %   HbA1c POC (<> result, manual entry) 6.5 4.0 - 5.6 %   HbA1c, POC (prediabetic range) 6.5 (A) 5.7 - 6.4 %   HbA1c, POC (controlled diabetic range) 6.5 0.0 - 7.0 %   TSH     Status: None   Collection Time: 11/10/18 11:11 AM  Result Value Ref Range   TSH 0.43 0.35 - 4.50 uIU/mL  CBC w/Diff     Status: Abnormal   Collection Time: 11/10/18 11:11 AM  Result Value Ref Range   WBC 5.3 4.0 - 10.5 K/uL   RBC 4.59 3.87 - 5.11 Mil/uL   Hemoglobin 13.4 12.0 - 15.0 g/dL   HCT 40.2 36.0 - 46.0 %   MCV 87.6 78.0 - 100.0 fl   MCHC 33.5 30.0 - 36.0 g/dL   RDW 13.1 11.5 - 15.5 %   Platelets 149.0 (L) 150.0 - 400.0 K/uL   Neutrophils Relative % 47.4 43.0 - 77.0 %   Lymphocytes Relative 41.2 12.0 - 46.0 %  Monocytes Relative 7.9 3.0 - 12.0 %   Eosinophils Relative 2.0 0.0 - 5.0 %   Basophils Relative 1.5 0.0 - 3.0 %   Neutro Abs 2.5 1.4 - 7.7 K/uL   Lymphs Abs 2.2 0.7 - 4.0 K/uL   Monocytes Absolute 0.4 0.1 - 1.0 K/uL   Eosinophils Absolute 0.1 0.0 - 0.7 K/uL   Basophils Absolute 0.1 0.0 - 0.1 K/uL  Lipid panel     Status: Abnormal   Collection Time: 11/10/18 11:11 AM  Result Value Ref Range   Cholesterol 165 0 - 200 mg/dL    Comment: ATP III Classification       Desirable:  < 200 mg/dL               Borderline High:  200 - 239 mg/dL          High:  > = 240 mg/dL   Triglycerides 198.0 (H) 0.0 - 149.0 mg/dL    Comment: Normal:  <150 mg/dLBorderline High:  150 - 199 mg/dL   HDL 46.30 >39.00 mg/dL   VLDL 39.6 0.0 - 40.0 mg/dL   LDL Cholesterol 79 0 - 99 mg/dL   Total CHOL/HDL Ratio 4     Comment:                Men          Women1/2 Average Risk     3.4          3.3Average Risk          5.0          4.42X Average Risk          9.6          7.13X Average Risk          15.0          11.0                       NonHDL 118.70     Comment: NOTE:  Non-HDL goal should be 30 mg/dL higher than patient's LDL goal (i.e. LDL goal of < 70 mg/dL, would have non-HDL goal of < 100 mg/dL)  Comp Met (CMET)     Status: Abnormal   Collection Time: 11/10/18 11:11 AM  Result Value Ref Range   Sodium 140 135 - 145 mEq/L   Potassium 4.1 3.5 - 5.1 mEq/L   Chloride 104 96 - 112  mEq/L   CO2 28 19 - 32 mEq/L   Glucose, Bld 126 (H) 70 - 99 mg/dL   BUN 11 6 - 23 mg/dL   Creatinine, Ser 0.67 0.40 - 1.20 mg/dL   Total Bilirubin 1.0 0.2 - 1.2 mg/dL   Alkaline Phosphatase 132 (H) 39 - 117 U/L   AST 13 0 - 37 U/L   ALT 12 0 - 35 U/L   Total Protein 7.3 6.0 - 8.3 g/dL   Albumin 4.4 3.5 - 5.2 g/dL   Calcium 9.2 8.4 - 10.5 mg/dL   GFR 88.33 >60.00 mL/min     ROS: 14 pt review of systems performed and negative (unless mentioned in an HPI)  Objective: BP 135/81 (BP Location: Left Arm, Patient Position: Sitting, Cuff Size: Normal)   Pulse 73   Temp 98 F (36.7 C) (Temporal)   Resp 18   Ht 5' 2"  (1.575 m)   Wt 184 lb 8 oz (83.7 kg)   LMP 04/27/2005 (Approximate)   SpO2 98%   BMI 33.75  kg/m  Gen: Afebrile. No acute distress. Nontoxic in appearance, well-developed, well-nourished, pleasant Caucasian female, obese HENT: AT. [place]. MMM Eyes:Pupils Equal Round Reactive to light, Extraocular movements intact,  Conjunctiva without redness, discharge or icterus. Neck/lymp/endocrine: Supple, no lymphadenopathy, no thyromegaly CV: RRR no murmur, no edema, +2/4 P posterior tibialis pulses.  No carotid bruits. No JVD. Chest: CTAB, no wheeze, rhonchi or crackles.  Normal respiratory effort.  Good air movement. Abd: Soft. BS present.  Skin: Tick bite left hip with mild erythema surrounding.  No swelling.  No drainage.  No additional rashes.  No purpura or petechiae. Warm and well-perfused. Skin intact. Neuro/Msk: Normal gait. PERLA. EOMi. Alert. Oriented x3.   Psych: Normal affect, dress and demeanor. Normal speech. Normal thought content and judgment.  Results for orders placed or performed in visit on 11/10/18 (from the past 48 hour(s))  POCT glycosylated hemoglobin (Hb A1C)     Status: Abnormal   Collection Time: 11/10/18 10:46 AM  Result Value Ref Range   Hemoglobin A1C 6.5 (A) 4.0 - 5.6 %   HbA1c POC (<> result, manual entry) 6.5 4.0 - 5.6 %   HbA1c, POC (prediabetic  range) 6.5 (A) 5.7 - 6.4 %   HbA1c, POC (controlled diabetic range) 6.5 0.0 - 7.0 %  TSH     Status: None   Collection Time: 11/10/18 11:11 AM  Result Value Ref Range   TSH 0.43 0.35 - 4.50 uIU/mL  CBC w/Diff     Status: Abnormal   Collection Time: 11/10/18 11:11 AM  Result Value Ref Range   WBC 5.3 4.0 - 10.5 K/uL   RBC 4.59 3.87 - 5.11 Mil/uL   Hemoglobin 13.4 12.0 - 15.0 g/dL   HCT 40.2 36.0 - 46.0 %   MCV 87.6 78.0 - 100.0 fl   MCHC 33.5 30.0 - 36.0 g/dL   RDW 13.1 11.5 - 15.5 %   Platelets 149.0 (L) 150.0 - 400.0 K/uL   Neutrophils Relative % 47.4 43.0 - 77.0 %   Lymphocytes Relative 41.2 12.0 - 46.0 %   Monocytes Relative 7.9 3.0 - 12.0 %   Eosinophils Relative 2.0 0.0 - 5.0 %   Basophils Relative 1.5 0.0 - 3.0 %   Neutro Abs 2.5 1.4 - 7.7 K/uL   Lymphs Abs 2.2 0.7 - 4.0 K/uL   Monocytes Absolute 0.4 0.1 - 1.0 K/uL   Eosinophils Absolute 0.1 0.0 - 0.7 K/uL   Basophils Absolute 0.1 0.0 - 0.1 K/uL  Lipid panel     Status: Abnormal   Collection Time: 11/10/18 11:11 AM  Result Value Ref Range   Cholesterol 165 0 - 200 mg/dL    Comment: ATP III Classification       Desirable:  < 200 mg/dL               Borderline High:  200 - 239 mg/dL          High:  > = 240 mg/dL   Triglycerides 198.0 (H) 0.0 - 149.0 mg/dL    Comment: Normal:  <150 mg/dLBorderline High:  150 - 199 mg/dL   HDL 46.30 >39.00 mg/dL   VLDL 39.6 0.0 - 40.0 mg/dL   LDL Cholesterol 79 0 - 99 mg/dL   Total CHOL/HDL Ratio 4     Comment:                Men          Women1/2 Average Risk     3.4  3.3Average Risk          5.0          4.42X Average Risk          9.6          7.13X Average Risk          15.0          11.0                       NonHDL 118.70     Comment: NOTE:  Non-HDL goal should be 30 mg/dL higher than patient's LDL goal (i.e. LDL goal of < 70 mg/dL, would have non-HDL goal of < 100 mg/dL)  Comp Met (CMET)     Status: Abnormal   Collection Time: 11/10/18 11:11 AM  Result Value Ref Range    Sodium 140 135 - 145 mEq/L   Potassium 4.1 3.5 - 5.1 mEq/L   Chloride 104 96 - 112 mEq/L   CO2 28 19 - 32 mEq/L   Glucose, Bld 126 (H) 70 - 99 mg/dL   BUN 11 6 - 23 mg/dL   Creatinine, Ser 0.67 0.40 - 1.20 mg/dL   Total Bilirubin 1.0 0.2 - 1.2 mg/dL   Alkaline Phosphatase 132 (H) 39 - 117 U/L   AST 13 0 - 37 U/L   ALT 12 0 - 35 U/L   Total Protein 7.3 6.0 - 8.3 g/dL   Albumin 4.4 3.5 - 5.2 g/dL   Calcium 9.2 8.4 - 10.5 mg/dL   GFR 88.33 >60.00 mL/min    Assessment/plan: Maria Watkins is a 63 y.o. female present for TOC Type 2 diabetes mellitus -diet controlled -Patient has always been diet controlled with her diabetes.  She watches her diet closely and attempts to exercise. - POCT glycosylated hemoglobin (Hb A1C) - TSH - Lipid panel - Comp Met (CMET) PNA series: PNA23 04/14/2018- pneumovax die 03/2019 Flu shot: (recommneded yearly) BMP: Collected today. Foot exam: Completed 01/18/2018 Eye exam: 11/2017, patient is scheduling an appointment for this year and transferring back to her ophthalmologist Dr. Marica Otter. A1c: 6.7>> 6.7>> 6.3>> 6.5 today Follow-up 6 months as long as labs are normal.  Essential hypertension/obesity - stable. Refills on lisinopril 10 mg QD - TSH - CBC w/Diff - Lipid panel - Comp Met (CMET) - lisinopril (ZESTRIL) 10 MG tablet; Take 1 tablet (10 mg total) by mouth daily.  Dispense: 90 tablet; Refill: 1 Follow-up 6 months as long as labs are normal.  Tick bite of abdomen, initial encounter Patient without systemic symptoms.  Take suspected to be on less than 24 hours.  Patient agreed with watchful waiting.  She will monitor for any fevers, chills, headache or rash development.  If occurs she will call in for an appointment.  Seasonal allergic rhinitis due to pollen Allergy symptoms are much better controlled now that she is on Zyrtec, Flonase and Astelin.  Continue current regimen through the summer recommended.  > 25 minutes spent with patient,  >50% of time spent face to face     Note is dictated utilizing voice recognition software. Although note has been proof read prior to signing, occasional typographical errors still can be missed. If any questions arise, please do not hesitate to call for verification.  Electronically signed by: Howard Pouch, DO [place]

## 2018-11-10 NOTE — Patient Instructions (Addendum)
Pleasure to meet you in person today! Refilled your medications.  We will call you with lab results.   Please help [place] help you:  We are honored you have chosen [facility] for your Primary Care home. Below you will find basic instructions that you may need to access in the future. Please help [place] help you by reading the instructions, which cover many of the frequent questions we experience.   Prescription refills and request:  -In order to allow more efficient response time, please call your pharmacy for all refills. They will forward the request electronically to [place]. This allows for the quickest possible response. Request left on a nurse line can take longer to refill, since these are checked as time allows between office patients and other phone calls.  - refill request can take up to 3-5 working days to complete.  - If request is sent electronically and request is appropiate, it is usually completed in 1-2 business days.  - all patients will need to be seen routinely for all chronic medical conditions requiring prescription medications (see follow-up below). If you are overdue for follow up on your condition, you will be asked to make an appointment and we will call in enough medication to cover you until your appointment (up to 30 days).  - all controlled substances will require a face to face visit to request/refill.  - if you desire your prescriptions to go through a new pharmacy, and have an active script at original pharmacy, you will need to call your pharmacy and have scripts transferred to new pharmacy. This is completed between the pharmacy locations and not by your provider.    Results: If any images or labs were ordered, it can take up to 1 week to get results depending on the test ordered and the lab/facility running and resulting the test. - Normal or stable results, which do not need further discussion, may be released to your mychart immediately with attached note to you. A  call may not be generated for normal results. Please make certain to sign up for mychart. If you have questions on how to activate your mychart you can call the front office.  - If your results need further discussion, our office will attempt to contact you via phone, and if unable to reach you after 2 attempts, we will release your abnormal result to your mychart with instructions.  - All results will be automatically released in mychart after 1 week.  - Your provider will provide you with explanation and instruction on all relevant material in your results. Please keep in mind, results and labs may appear confusing or abnormal to the untrained eye, but it does not mean they are actually abnormal for you personally. If you have any questions about your results that are not covered, or you desire more detailed explanation than what was provided, you should make an appointment with your provider to do so.   Our office handles many outgoing and incoming calls daily. If we have not contacted you within 1 week about your results, please check your mychart to see if there is a message first and if not, then contact our office.  In helping with this matter, you help decrease call volume, and therefore allow [place] to be able to respond to patients needs more efficiently.   Acute office visits (sick visit):  An acute visit is intended for a new problem and are scheduled in shorter time slots to allow schedule openings for  patients with new problems. This is the appropriate visit to discuss a new problem. Problems will not be addressed by phone call or Echart message. Appointment is needed if requesting treatment. In order to provide you with excellent quality medical care with proper time for you to explain your problem, have an exam and receive treatment with instructions, these appointments should be limited to one new problem per visit. If you experience a new problem, in which you desire to be addressed, please  make an acute office visit, we save openings on the schedule to accommodate you. Please do not save your new problem for any other type of visit, let us take care of it properly and quickly for you.   Follow up visits:  Depending on your condition(s) your provider will need to see you routinely in order to provide you with quality care and prescribe medication(s). Most chronic conditions (Example: hypertension, Diabetes, depression/anxiety... etc), require visits a couple times a year. Your provider will instruct you on proper follow up for your personal medical conditions and history. Please make certain to make follow up appointments for your condition as instructed. Failing to do so could result in lapse in your medication treatment/refills. If you request a refill, and are overdue to be seen on a condition, we will always provide you with a 30 day script (once) to allow you time to schedule.    Medicare wellness (well visit): - we have a wonderful Nurse Maudie Mercury), that will meet with you and provide you will yearly medicare wellness visits. These visits should occur yearly (can not be scheduled less than 1 calendar year apart) and cover preventive health, immunizations, advance directives and screenings you are entitled to yearly through your medicare benefits. Do not miss out on your entitled benefits, this is when medicare will pay for these benefits to be ordered for you.  These are strongly encouraged by your provider and is the appropriate type of visit to make certain you are up to date with all preventive health benefits. If you have not had your medicare wellness exam in the last 12 months, please make certain to schedule one by calling the office and schedule your medicare wellness with Maudie Mercury as soon as possible.   Yearly physical (well visit):  - Adults are recommended to be seen yearly for physicals. Check with your insurance and date of your last physical, most insurances require one calendar  year between physicals. Physicals include all preventive health topics, screenings, medical exam and labs that are appropriate for gender/age and history. You may have fasting labs needed at this visit. This is a well visit (not a sick visit), new problems should not be covered during this visit (see acute visit).  - Pediatric patients are seen more frequently when they are younger. Your provider will advise you on well child visit timing that is appropriate for your their age. - This is not a medicare wellness visit. Medicare wellness exams do not have an exam portion to the visit. Some medicare companies allow for a physical, some do not allow a yearly physical. If your medicare allows a yearly physical you can schedule the medicare wellness with our nurse Maudie Mercury and have your physical with your provider after, on the same day. Please check with insurance for your full benefits.   Late Policy/No Shows:  - all new patients should arrive 15-30 minutes earlier than appointment to allow [place] time  to  obtain all personal demographics,  insurance information and for  you to complete office paperwork. - All established patients should arrive 10-15 minutes earlier than appointment time to update all information and be checked in .  - In our best efforts to run on time, if you are late for your appointment you will be asked to either reschedule or if able, we will work you back into the schedule. There will be a wait time to work you back in the schedule,  depending on availability.  - If you are unable to make it to your appointment as scheduled, please call 24 hours ahead of time to allow [place] to fill the time slot with someone else who needs to be seen. If you do not cancel your appointment ahead of time, you may be charged a no show fee.

## 2018-11-14 ENCOUNTER — Telehealth: Payer: Self-pay | Admitting: Family Medicine

## 2018-11-14 DIAGNOSIS — R748 Abnormal levels of other serum enzymes: Secondary | ICD-10-CM

## 2018-11-14 NOTE — Telephone Encounter (Signed)
Please inform patient the following information: Her labs are all normal- with the following exceptions.  - her triglycerides were mildly elevated- but much improved from the past collections. I would recommend a mediterranean diet and regular exercise.  A mediterranean diet is high in fruits, vegetables, whole grains, fish, chicken, nuts, healthy fats (olive oil or canola oil). Low fat dairy. There are many online resources and books on this diet. Limit butter, margarine, red meat and sweets. Omega 3/fish oil 1000-2000 mg QD can be helpful also.  - She also has an elevation in her alk phos. This is enzyme produced by the liver, but also in small amounts in kidneys and bone. She has a h/o gallstones on her PMH- elevation in alk phos could suggest she has had a recent stone  or has a  stone. I would like to discuss this further with her as well as further work up (if any needed). Virtual visit is ok if she desires- we will be ordering labs though so she may just want to come in (either fine)

## 2018-11-14 NOTE — Telephone Encounter (Signed)
Spoke with patient to give results. Stated verbal understanding.  She states that she does know she has a gallstone, but nothing has been done about it. She was interested in coming in for the appointment so she has been scheduled on 7/29 at 9:30am to discuss possible treatment.

## 2018-11-22 LAB — HM DIABETES EYE EXAM

## 2018-11-23 ENCOUNTER — Encounter: Payer: Self-pay | Admitting: Family Medicine

## 2018-11-23 ENCOUNTER — Other Ambulatory Visit: Payer: Self-pay

## 2018-11-23 ENCOUNTER — Ambulatory Visit (INDEPENDENT_AMBULATORY_CARE_PROVIDER_SITE_OTHER): Payer: PPO | Admitting: Family Medicine

## 2018-11-23 VITALS — BP 135/83 | HR 60 | Temp 97.4°F | Resp 18 | Ht 62.0 in | Wt 185.2 lb

## 2018-11-23 DIAGNOSIS — R748 Abnormal levels of other serum enzymes: Secondary | ICD-10-CM | POA: Diagnosis not present

## 2018-11-23 DIAGNOSIS — Z8719 Personal history of other diseases of the digestive system: Secondary | ICD-10-CM

## 2018-11-23 LAB — GAMMA GT: GGT: 17 U/L (ref 7–51)

## 2018-11-23 LAB — VITAMIN D 25 HYDROXY (VIT D DEFICIENCY, FRACTURES): VITD: 25.83 ng/mL — ABNORMAL LOW (ref 30.00–100.00)

## 2018-11-23 NOTE — Patient Instructions (Signed)

## 2018-11-23 NOTE — Progress Notes (Signed)
Maria Watkins , November 29, 1955, 63 y.o., female MRN: [id number] Patient Care Team    Relationship Specialty Notifications Start End  Ma Hillock, DO PCP - General Family Medicine  10/27/18   Juanita Craver, MD Consulting Physician Gastroenterology  01/18/18   Jerelyn Charles, MD Consulting Physician Obstetrics  01/18/18   Marica Otter, [place]  Optometry  11/10/18     Chief Complaint  Patient presents with  . Cholelithiasis    Pt is following up to discuss tx for gallstones.      Subjective: Pt presents for an OV to discuss her elevated alk phos. and review of EMR she had a mildly elevated alk phos last year at 119.  On her laboratory collections 11/10/2018 it was noted her alkaline phosphatase was 132.  Prior to September/24th/2019 her alkaline phosphatase had always been normal.  She has a history of gallstones.  She had some mild abdominal discomfort many years ago and the gallstone was found, however she has not had any complications from it since.  She denies any current symptoms of right upper quadrant pain, nausea, fever or vomit.  She denies any fatigue or skin rash/pruritus.  She denies any bone pain.  Bone density completed 01-15-2016 and was normal.  Depression screen [facility] 2/9 07/28/2018  Decreased Interest 0  Down, Depressed, Hopeless 0  PHQ - 2 Score 0    Allergies  Allergen Reactions  . Terconazole Nausea Only   Social History   Social History Narrative  . Not on file   Past Medical History:  Diagnosis Date  . Abnormal Pap smear of cervix 1980's   resolved post cryo with normal follow up  . Depression   . Gallstones 08/04/2012  . Hypertension   . Type 2 diabetes mellitus without complication, without long-term current use of insulin ([place]) 01/18/2018   Past Surgical History:  Procedure Laterality Date  . CERVIX LESION DESTRUCTION  1980s  . COLPOSCOPY  1980's   Family History  Problem Relation Age of Onset  . Diabetes Mother   . Arthritis Mother   . COPD Father   .  Asthma Father   . Heart disease Father   . Diabetes Sister   . Asthma Brother   . COPD Sister   . Rheum arthritis Sister   . Breast cancer Maternal Aunt   . Alcohol abuse Son    Allergies as of 11/23/2018      Reactions   Terconazole Nausea Only      Medication List       Accurate as of November 23, 2018  9:51 AM. If you have any questions, ask your nurse or doctor.        azelastine 0.1 % nasal spray Commonly known as: ASTELIN Place 2 sprays into both nostrils 2 (two) times daily. Use in each nostril as directed   cetirizine 10 MG tablet Commonly known as: ZYRTEC Take 1 tablet (10 mg total) by mouth daily.   fluticasone 50 MCG/ACT nasal spray Commonly known as: FLONASE Place 2 sprays into both nostrils daily.   lisinopril 10 MG tablet Commonly known as: ZESTRIL Take 1 tablet (10 mg total) by mouth daily.       All past medical history, surgical history, allergies, family history, immunizations andmedications were updated in the EMR today and reviewed under the history and medication portions of their EMR.     ROS: Negative, with the exception of above mentioned in HPI   Objective:  BP 135/83 (BP Location:  Left Arm, Patient Position: Sitting, Cuff Size: Normal)   Pulse 60   Temp (!) 97.4 F (36.3 C) (Temporal)   Resp 18   Ht 5' 2" (1.575 m)   Wt 185 lb 4 oz (84 kg)   LMP 04/27/2005 (Approximate)   SpO2 96%   BMI 33.88 kg/m  Body mass index is 33.88 kg/m. Gen: Afebrile. No acute distress. Nontoxic in appearance, well developed, well nourished.  HENT: AT. [place]. MMM Eyes:Pupils Equal Round Reactive to light, Extraocular movements intact,  Conjunctiva without redness, discharge or icterus. Abd: Soft.  Flat. NTND. BS present.  No masses palpated. No rebound or guarding.  Negative Murphy's. Skin: no rashes, purpura or petechiae.  Neuro: Normal gait. PERLA. EOMi. Alert. Oriented x3  Psych: Normal affect, dress and demeanor. Normal speech. Normal thought content and  judgment.  No exam data present No results found. No results found for this or any previous visit (from the past 24 hour(s)).  Assessment/Plan: Maria Watkins Maria Watkins is a 63 y.o. female present for OV for  Elevated alkaline phosphatase level/ Personal history of gallstones -Discussed potential causes of elevated alkaline phosphatase with her today.  She does have a history of gallstones so could be related to gallstones.  Watkins perform a GGT to see if alkaline phosphatase is from liver origin.  Also complete vitamin D/PTH and calcium.  Her bone density completed 3 years ago was normal.  She is up-to-date with all of her cancer screenings. - She is not taking any medications that would increase her alk phos. - Vitamin D (25 hydroxy) - Gamma GT - PTH, Intact and Calcium -Depending upon results consider close monitoring, right upper quadrant ultrasound if GGT positive, repeat bone density or endocrine referral.     Reviewed expectations re: course of current medical issues.  Discussed self-management of symptoms.  Outlined signs and symptoms indicating need for more acute intervention.  Patient verbalized understanding and all questions were answered.  Patient received an After-Visit Summary.    No orders of the defined types were placed in this encounter.   > 15 minutes spent with patient, > 50% of that time face to face  Note is dictated utilizing voice recognition software. Although note has been proof read prior to signing, occasional typographical errors still can be missed. If any questions arise, please do not hesitate to call for verification.   electronically signed by:  Howard Pouch, DO  [place]

## 2018-11-24 LAB — PTH, INTACT AND CALCIUM
Calcium: 9.7 mg/dL (ref 8.6–10.4)
PTH: 33 pg/mL (ref 14–64)

## 2018-11-25 ENCOUNTER — Telehealth: Payer: Self-pay | Admitting: Family Medicine

## 2018-11-25 NOTE — Telephone Encounter (Signed)
Pt was called and given labs results, pt verbalizes understanding. She will call back to schedule appt as she does not have calender in front of her

## 2018-11-25 NOTE — Telephone Encounter (Signed)
Please inform patient the following information: By her labs- the elevation in alk phos is possibly from lower than normal vit d and not from her liver or gallstone.  The test that we did that looks to see if it was from the liver (GGT)- was normal. Therefore, we do not need to do an [place] right now.  - I would suggest starting a OTC vit d supplement 1000u daily with a meal.  - please make sure she gets on the schedule for her routine [facility] follow up end of December and we will retest her alk phos and Vit.d on that appt also.

## 2018-12-30 DIAGNOSIS — H40033 Anatomical narrow angle, bilateral: Secondary | ICD-10-CM | POA: Diagnosis not present

## 2018-12-30 DIAGNOSIS — H04123 Dry eye syndrome of bilateral lacrimal glands: Secondary | ICD-10-CM | POA: Diagnosis not present

## 2019-01-10 ENCOUNTER — Telehealth: Payer: Self-pay | Admitting: Family Medicine

## 2019-01-10 NOTE — Telephone Encounter (Signed)
Medications were not called in last time and pt needed OV. I will call patient and let her know she needs to make appt. I can offer the 4:20 VV tomorrow but patient can only do telephone visit per previous notes (no Internet or smart phone). Please advise if pt can be fit in earlier and if telephone is okay.  Thanks

## 2019-01-10 NOTE — Telephone Encounter (Signed)
Patient requesting meds for allergies/sinus problems. Patient said that Dr. Raoul Pitch called in [place] and nasal meds the last time she was having these same symptoms. (10/26/18) Requesting to do it again.  Patient uses [facility] [place]  Patient can be reached at [phone]  Thank you

## 2019-01-10 NOTE — Telephone Encounter (Signed)
Pt was called and told she would need a OV. Pt was scheduled for telephone visit

## 2019-01-10 NOTE — Telephone Encounter (Signed)
Telephone visit is fine.

## 2019-01-11 ENCOUNTER — Encounter: Payer: Self-pay | Admitting: Family Medicine

## 2019-01-11 ENCOUNTER — Other Ambulatory Visit: Payer: Self-pay

## 2019-01-11 ENCOUNTER — Ambulatory Visit (INDEPENDENT_AMBULATORY_CARE_PROVIDER_SITE_OTHER): Payer: PPO | Admitting: Family Medicine

## 2019-01-11 VITALS — Wt 184.0 lb

## 2019-01-11 DIAGNOSIS — J0101 Acute recurrent maxillary sinusitis: Secondary | ICD-10-CM | POA: Diagnosis not present

## 2019-01-11 MED ORDER — AMOXICILLIN 400 MG/5ML PO SUSR: 800.0000 mg | Freq: Two times a day (BID) | ORAL | 0 refills | Status: DC

## 2019-01-11 NOTE — Progress Notes (Signed)
VISIT VIA telephone  I connected with Dian SituBonnie L Woelfel on 01/11/19 at  4:20 PM EDT by a telemedicine application and verified that I am speaking with the correct person using two identifiers. Location patient: Home [facility] [place] Persons participating in the virtual visit: Patient, Dr. Claiborne BillingsKuneff and R.Baker, LPN  I discussed the limitations of evaluation and management by telemedicine and the availability of in person appointments. The patient expressed understanding and agreed to proceed.  Interactive audio and video telecommunications were attempted between this provider and patient, however failed, due to patient having technical difficulties OR patient did not have access to video capability. We continued and completed visit with audio only.    SUBJECTIVE Chief Complaint  Patient presents with  . Nasal Congestion    upper left side of her mouth is sore. congested. left ear is popping. taking OTC's to help. she feels worse at night.   . Cough    no fever, chills and some SOB. feeling bad since yesterday.    HPI: Maria Watkins is a 63 y.o. female present for acute illness. She states she was feeling fatigued Sunday evening. Her ear starting popping also. She started the nasal spray. Monday she noticed her teeth the left side of her face was sore when she touched and chewed. She states when she first wakes she coughs up mucous. She has had a headache. She denies fever, chills nausea or vomit. She was out in the yard doing   ROS: See pertinent positives and negatives per HPI.  Patient Active Problem List   Diagnosis Date Noted  . Obesity (BMI 30-39.9) 11/10/2018  . Allergic rhinitis 11/10/2018  . Essential hypertension 01/18/2018  . Diet-controlled diabetes mellitus (HCC) 01/18/2018    Social History   Tobacco Use  . Smoking status: Former Smoker    Quit date: 04/28/1991    Years since quitting: 27.7  . Smokeless tobacco: Never Used  Substance  Use Topics  . Alcohol use: No    Current Outpatient Medications:  .  azelastine (ASTELIN) 0.1 % nasal spray, Place 2 sprays into both nostrils 2 (two) times daily. Use in each nostril as directed, Disp: 30 mL, Rfl: 12 .  cetirizine (ZYRTEC) 10 MG tablet, Take 1 tablet (10 mg total) by mouth daily., Disp: 30 tablet, Rfl: 11 .  cholecalciferol (VITAMIN D3) 25 MCG (1000 UT) tablet, Take 1,000 Units by mouth daily., Disp: , Rfl:  .  fluticasone (FLONASE) 50 MCG/ACT nasal spray, Place 2 sprays into both nostrils daily., Disp: 16 g, Rfl: 6 .  lisinopril (ZESTRIL) 10 MG tablet, Take 1 tablet (10 mg total) by mouth daily., Disp: 90 tablet, Rfl: 1  Allergies  Allergen Reactions  . Terconazole Nausea Only    OBJECTIVE: Wt 184 lb (83.5 kg)   LMP 04/27/2005 (Approximate)   BMI 33.65 kg/m  Gen: No acute distress. Nontoxic Chest: Cough or shortness of breath not present Neuro:  Alert. Oriented x3    ASSESSMENT AND PLAN: Maria Watkins is a 63 y.o. female present for  Acute recurrent maxillary sinusitis Presumed infection given pain at sinus, however can not rule out pain is not coming from infected or damaged tooth.  Rest, hydrate.  + flonase, mucinex (DM if cough), nettie pot or nasal saline.  amox prescribed, take until completed.  She is start her allergy regimen. If it does not improve she should see her dentist if still have pain near teeth If cough present it can  last up to 6-8 weeks.  F/U 2 weeks of not improved.     > 15 minutes spent with patient  Howard Pouch, DO 01/11/2019

## 2019-01-12 ENCOUNTER — Encounter: Payer: Self-pay | Admitting: Family Medicine

## 2019-01-13 ENCOUNTER — Ambulatory Visit: Payer: PPO

## 2019-01-16 ENCOUNTER — Other Ambulatory Visit: Payer: Self-pay

## 2019-01-16 ENCOUNTER — Ambulatory Visit (INDEPENDENT_AMBULATORY_CARE_PROVIDER_SITE_OTHER): Payer: PPO

## 2019-01-16 DIAGNOSIS — Z23 Encounter for immunization: Secondary | ICD-10-CM

## 2019-02-02 ENCOUNTER — Encounter: Payer: Self-pay | Admitting: Family Medicine

## 2019-02-02 DIAGNOSIS — Z124 Encounter for screening for malignant neoplasm of cervix: Secondary | ICD-10-CM | POA: Diagnosis not present

## 2019-02-02 DIAGNOSIS — Z01419 Encounter for gynecological examination (general) (routine) without abnormal findings: Secondary | ICD-10-CM | POA: Diagnosis not present

## 2019-02-02 DIAGNOSIS — Z1231 Encounter for screening mammogram for malignant neoplasm of breast: Secondary | ICD-10-CM | POA: Diagnosis not present

## 2019-02-02 LAB — HM PAP SMEAR

## 2019-05-10 ENCOUNTER — Ambulatory Visit: Payer: PPO | Admitting: Family Medicine

## 2019-05-11 ENCOUNTER — Telehealth: Payer: Self-pay

## 2019-05-11 NOTE — Telephone Encounter (Signed)
Pt was called and given instructions, she verbalized understanding  

## 2019-05-11 NOTE — Telephone Encounter (Signed)
Patient called and stated she was to see Dr. Claiborne Billings yesterday and has been R/S'd to Monday.  She stated her systolic number has been running a little high and she was wanting to know if she could make some adjustments to her BP medication before being seen on Monday.  Patient is worried she might miss your call, but I told her if she does to please call us back and we will get you to the phone to speak with her.

## 2019-05-11 NOTE — Telephone Encounter (Signed)
Pt was called and stated she has been checking her BP since Sunday and documenting it, taking 3-4x daily. Pt is taking BP medications in the morning at 9-10am.  Sunday 139/79, 144/88, 146/83.  This morning it was 140/88 P 62 Before taking medication  Pt did have headache this morning but is gone now. Denies chest pain and SOB. Pt was advised if she had any symptoms of SOB, chest pain, jaw pain, etc to go to ER. She verbalized understanding. She was advised to take BP 2-3 hours after taking medications and document. Pt has appt Monday and wanted Dr Claiborne Billings to know what was going on with BP.

## 2019-05-11 NOTE — Telephone Encounter (Signed)
Please asked the patient to start 1.5 tabs of her lisinopril daily and continue to check blood pressures and to her appointment on Monday.  Agree make sure to check blood pressures at least 2 hours after taking medications in sitting down for at least 10 minutes and relaxing. Also please ask her to make sure she takes her medication at least 2 hours before her appointment on Monday.

## 2019-05-15 ENCOUNTER — Other Ambulatory Visit: Payer: Self-pay

## 2019-05-15 ENCOUNTER — Encounter: Payer: Self-pay | Admitting: Family Medicine

## 2019-05-15 ENCOUNTER — Ambulatory Visit (INDEPENDENT_AMBULATORY_CARE_PROVIDER_SITE_OTHER): Payer: PPO | Admitting: Family Medicine

## 2019-05-15 VITALS — BP 150/86 | HR 61 | Temp 97.9°F | Ht 62.0 in | Wt 184.0 lb

## 2019-05-15 DIAGNOSIS — J301 Allergic rhinitis due to pollen: Secondary | ICD-10-CM | POA: Diagnosis not present

## 2019-05-15 DIAGNOSIS — E119 Type 2 diabetes mellitus without complications: Secondary | ICD-10-CM | POA: Diagnosis not present

## 2019-05-15 DIAGNOSIS — I10 Essential (primary) hypertension: Secondary | ICD-10-CM

## 2019-05-15 DIAGNOSIS — E559 Vitamin D deficiency, unspecified: Secondary | ICD-10-CM

## 2019-05-15 DIAGNOSIS — R748 Abnormal levels of other serum enzymes: Secondary | ICD-10-CM | POA: Insufficient documentation

## 2019-05-15 DIAGNOSIS — E669 Obesity, unspecified: Secondary | ICD-10-CM

## 2019-05-15 DIAGNOSIS — E2839 Other primary ovarian failure: Secondary | ICD-10-CM

## 2019-05-15 DIAGNOSIS — R002 Palpitations: Secondary | ICD-10-CM | POA: Diagnosis not present

## 2019-05-15 HISTORY — DX: Other primary ovarian failure: E28.39

## 2019-05-15 HISTORY — DX: Abnormal levels of other serum enzymes: R74.8

## 2019-05-15 LAB — COMPREHENSIVE METABOLIC PANEL
ALT: 14 U/L (ref 0–35)
AST: 15 U/L (ref 0–37)
Albumin: 4.3 g/dL (ref 3.5–5.2)
Alkaline Phosphatase: 134 U/L — ABNORMAL HIGH (ref 39–117)
BUN: 8 mg/dL (ref 6–23)
CO2: 27 mEq/L (ref 19–32)
Calcium: 9.4 mg/dL (ref 8.4–10.5)
Chloride: 104 mEq/L (ref 96–112)
Creatinine, Ser: 0.67 mg/dL (ref 0.40–1.20)
GFR: 88.19 mL/min (ref >60.00)
Glucose, Bld: 153 mg/dL — ABNORMAL HIGH (ref 70–99)
Potassium: 4 mEq/L (ref 3.5–5.1)
Sodium: 139 mEq/L (ref 135–145)
Total Bilirubin: 1.3 mg/dL — ABNORMAL HIGH (ref 0.2–1.2)
Total Protein: 7.4 g/dL (ref 6.0–8.3)

## 2019-05-15 LAB — T3, FREE: T3, Free: 3.3 pg/mL (ref 2.3–4.2)

## 2019-05-15 LAB — POCT GLYCOSYLATED HEMOGLOBIN (HGB A1C)
HbA1c POC (<> result, manual entry): 6.6 % (ref 4.0–5.6)
HbA1c, POC (controlled diabetic range): 6.6 % (ref 0.0–7.0)
HbA1c, POC (prediabetic range): 6.6 % — AB (ref 5.7–6.4)
Hemoglobin A1C: 6.6 % — AB (ref 4.0–5.6)

## 2019-05-15 LAB — TSH: TSH: 1.05 u[IU]/mL (ref 0.35–4.50)

## 2019-05-15 LAB — T4, FREE: Free T4: 0.89 ng/dL (ref 0.60–1.60)

## 2019-05-15 LAB — VITAMIN D 25 HYDROXY (VIT D DEFICIENCY, FRACTURES): VITD: 22.43 ng/mL — ABNORMAL LOW (ref 30.00–100.00)

## 2019-05-15 MED ORDER — LISINOPRIL 20 MG PO TABS: 20.0000 mg | ORAL_TABLET | Freq: Every day | ORAL | 1 refills | Status: DC

## 2019-05-15 NOTE — Progress Notes (Signed)
Patient ID: Maria Watkins, female  DOB: Sep 24, 1955, 64 y.o.   MRN: [id number] Patient Care Team    Relationship Specialty Notifications Start End  Maria Hillock, DO PCP - General Family Medicine  10/27/18   Maria Craver, MD Consulting Physician Gastroenterology  01/18/18   Maria Charles, MD Consulting Physician Obstetrics  01/18/18   Maria Watkins, [place]  Optometry  11/10/18     Chief Complaint  Patient presents with  . Hypertension    Fasting. Pt has not taken medications this AM. Pt started taking 1.5 tablets of Lisinopril  . Diabetes    Subjective:  Maria Watkins is a 64 y.o.  female present for f/u [facility].  Diet controlled diabetes: She has always been diet controlled with her diabetes.  Patient denies dizziness, hyperglycemic or hypoglycemic events. Patient denies numbness, tingling in the extremities or nonhealing wounds of feet.  SHe does not check her blood sugars at home.   PNA series: PNA23 04/14/2018.  Flu shot: 12/2018 UTD(recommneded yearly) Foot exam: Completed 01/18/2018 Eye exam: 11/2017, patient is scheduling an appointment for this year and transferring back to her ophthalmologist Dr. Marica Watkins. A1c: 6.7>> 6.7>> 6.3>> 6.5 > 6.6 today  Essential hypertension/obesity/palpitations Pt reports compliance with lisinopril 15 mg daily.  Patient reports even with increased dose over the weekend she was having elevated blood pressures above goal.  She has forgotten to take her blood pressure medication this morning.  Patient denies chest pain, shortness of breath, dizziness or lower extremity edema.  She does endorse occasional palpitations which are is fairly new for her.  She states it used to only be a few times a month and now is becoming a few times a week lasting about 3 to 4 seconds each.  She does endorse drinking quite a bit of caffeine and having increased anxiety at the moment. BMP/CbC: UTD  Diet: Regular Exercise: Routine exercise RF: Hypertension, diabetes,  obesity, former smoker, family history of heart disease  Seasonal allergies/seasonal allergies: Patient reports her allergies have been doing well on new regimen.  Regimen was changed to Zyrtec, Flonase, Astelin nasal spray and she reports much improved symptoms. Prior note: Pt presents for an OV with complaints of nasal congestion of 2 days duration.  Associated symptoms include waking up with congestion that gets better later in the day. Congestion when working in the yard.  She states she did cough up a small amount of yellow phlegm today.  Otherwise denies cough. She was treated for a sinus infection 09/17/2018 with amox x 10 days. She reports she has been taking claritin and Astelin nasal spray as well.  She endorses relief with her symptoms after completion of amoxicillin.  At that time she was having some sinus pain and pressure which has resolved with antibiotics.  He denies any fever, chills, nausea, vomit, sinus pain or teeth pain.   Elevated alk phos:  On routine lab work patient was found to have an elevated alk phos.  This was confirmed and ggt normal.  She has had a DEXA scan approximately 3-4 years ago that was normal with a T score of -0.8.  She had very mildly low vitamin D insufficiency and she has been taking her daily vitamin D supplement.  PTH and calcium were normal.  Depression screen [facility] 2/9 07/28/2018  Decreased Interest 0  Down, Depressed, Hopeless 0  PHQ - 2 Score 0   No flowsheet data found.     Fall Risk  07/28/2018  Falls in the past year? 0  Number falls in past yr: 0  Injury with Fall? 0  Follow up Falls evaluation completed    Immunization History  Administered Date(s) Administered  . Fluad Quad(high Dose 65+) 01/16/2019  . Influenza,inj,Quad PF,6+ Mos 01/18/2018  . Pneumococcal Polysaccharide-23 04/14/2018  . Tdap 04/27/2013    No exam data present  Past Medical History:  Diagnosis Date  . Abnormal Pap smear of cervix 1980's   resolved post cryo  with normal follow up  . Depression   . Gallstones 08/04/2012  . Hypertension   . Type 2 diabetes mellitus without complication, without long-term current use of insulin ([place]) 01/18/2018   Allergies  Allergen Reactions  . Terconazole Nausea Only   Past Surgical History:  Procedure Laterality Date  . CERVIX LESION DESTRUCTION  1980s  . COLPOSCOPY  1980's   Family History  Problem Relation Age of Onset  . Diabetes Mother   . Arthritis Mother   . COPD Father   . Asthma Father   . Heart disease Father   . Diabetes Sister   . Asthma Brother   . COPD Sister   . Rheum arthritis Sister   . Breast cancer Maternal Aunt   . Alcohol abuse Son    Social History   Social History Narrative  . Not on file    Allergies as of 05/15/2019      Reactions   Terconazole Nausea Only      Medication List       Accurate as of May 15, 2019  9:16 AM. If you have any questions, ask your nurse or doctor.        azelastine 0.1 % nasal spray Commonly known as: ASTELIN Place 2 sprays into both nostrils 2 (two) times daily. Use in each nostril as directed   cetirizine 10 MG tablet Commonly known as: ZYRTEC Take 1 tablet (10 mg total) by mouth daily.   cholecalciferol 25 MCG (1000 UNIT) tablet Commonly known as: VITAMIN D3 Take 1,000 Units by mouth daily.   fluticasone 50 MCG/ACT nasal spray Commonly known as: FLONASE Place 2 sprays into both nostrils daily.   lisinopril 10 MG tablet Commonly known as: ZESTRIL Take 1 tablet (10 mg total) by mouth daily. What changed: additional instructions       All past medical history, surgical history, allergies, family history, immunizations andmedications were updated in the EMR today and reviewed under the history and medication portions of their EMR.    No results found for this or any previous visit (from the past 2160 hour(s)).   ROS: 14 pt review of systems performed and negative (unless mentioned in an HPI)  Objective: BP (!)  150/86 (BP Location: Left Arm, Patient Position: Sitting, Cuff Size: Normal)   Pulse 61   Temp 97.9 F (36.6 C) (Temporal)   Ht 5' 2"  (1.575 m)   Wt 184 lb (83.5 kg)   LMP 04/27/2005 (Approximate)   SpO2 96%   BMI 33.65 kg/m  Gen: Afebrile. No acute distress.  HENT: AT. [place].  Eyes:Pupils Equal Round Reactive to light, Extraocular movements intact,  Conjunctiva without redness, discharge or icterus. Neck/lymp/endocrine: Supple, no lymphadenopathy, no thyromegaly CV: RRR no murmur, no edema, +2/4 P posterior tibialis pulses Chest: CTAB, no wheeze or crackles Skin: No rashes, purpura or petechiae.  Neuro:  Normal gait. PERLA. EOMi. Alert. Oriented x3  Psych: Normal affect, dress and demeanor. Normal speech. Normal thought content and judgment.  No results  found for this or any previous visit (from the past 48 hour(s)).  Assessment/plan: VETA DAMBROSIA is a 64 y.o. female present for TOC Type 2 diabetes mellitus -diet controlled -Patient has always been diet controlled with her diabetes.  She watches her diet closely and attempts to exercise.  She does endorse increasing her caffeine use and drinking more soda.  Her A1c is mildly elevated today. -Discussed starting Metformin low-dose.  Patient would like to focus on her diet and exercise and have a close follow-up in 3 months before starting medication. PNA series: PNA23 04/14/2018- prevnar due> but will wait until next visit so she can get her covid19 vaccine.  Flu shot: Up-to-date (recommneded yearly) BMP: Collected today. Foot exam: Completed 01/18/2018 Eye exam: 11/2017, patient is scheduling an appointment for this year and transferring back to her ophthalmologist Dr. Marica Watkins. A1c: 6.7>> 6.7>> 6.3>> 6.5> 6.6 today Follow-up 3 months -if A1c still elevated that time she is agreeable to start low-dose Metformin.  Essential hypertension/obesity/palpitations -Above goal.  Increase lisinopril to 20 mg daily.  She will inform us if  her blood pressure is greater than 130/80 after increasing dose. - TSH, T3, T4 Follow-up 3 months  Seasonal allergic rhinitis due to pollen Allergy symptoms are much better controlled now that she is on Zyrtec, Flonase and Astelin.  Continue current regimen through the summer recommended.  Elevated alk phos/abnormal vitamin D: -GGT, PTH and calcium were normal. -Bone density ordered today. -Vitamin D level collected today. -CMP collected today.  Orders Placed This Encounter  Procedures  . DG Bone Density  . Vitamin D (25 hydroxy)  . Comp Met (CMET)  . TSH  . T4, free  . T3, free  . POCT HgB A1C   Meds ordered this encounter  Medications  . lisinopril (ZESTRIL) 20 MG tablet    Sig: Take 1 tablet (20 mg total) by mouth daily.    Dispense:  90 tablet    Refill:  1    DC other doses. thanks    Note is dictated utilizing voice recognition software. Although note has been proof read prior to signing, occasional typographical errors still can be missed. If any questions arise, please do not hesitate to call for verification.  Electronically signed by: Howard Pouch, DO [place]

## 2019-05-15 NOTE — Patient Instructions (Signed)
[facility] Kitchen  COVID vaccines are starting for the community.  1. [facility] web page will also have up to date info on vaccinations offered through [facility] at  [email or web address]. - Appt also required.  - vaccines are administered at the [employer] ([facility]). . If website indicates you are eligible for vaccine- please call [phone]   2. [facility]  -This will be done according to the rollout plan published by the [facility] of Health and [employer] (NCDHHS). The first public phase, also known as "Phase 1(b)", is for individuals 75 and older. Location: [facility] (Health and Chief Strategy Officer) [address]; [place] [address] -  All citizens receiving a vaccine must complete a COVID-19 consent form. To lessen your wait time, these can be found and completed ahead of time at www.[place].org or you may pick up a paper copy at the [facility] [phone] 65 in [place]) or at [employer] - Future dates and locations will be widely shared through the [facility] of Public Health's COVID-19 Hotline at (413) 484-3074, media outlets in the [place] and through the [employer] (www.co.[email or web address].us and www.[place].org). Citizens can also contact the Department of Health and Human Services at [phone] for more information   Participants are asked to wear a face covering at vaccination sites.   Work on diet and exercise. I would recommend stop drinking the soda. It will help decrease the palpitations and lower your a1c.     Palpitations Palpitations are feelings that your heartbeat is not normal. Your heartbeat may feel like it is:  Uneven.  Faster than normal.  Fluttering.  Skipping a beat. This is usually not a serious problem. In some cases, you may need tests  to rule out any serious problems. Follow these instructions at home: Pay attention to any changes in your condition. Take these actions to help manage your symptoms: Eating and drinking  Avoid: ? Coffee, tea, soft drinks, and energy drinks. ? Chocolate. ? Alcohol. ? Diet pills. Lifestyle   Try to lower your stress. These things can help you relax: ? Yoga. ? Deep breathing and meditation. ? Exercise. ? Using words and images to create positive thoughts (guided imagery). ? Using your mind to control things in your body (biofeedback).  Do not use drugs.  Get plenty of rest and sleep. Keep a regular bed time. General instructions   Take over-the-counter and prescription medicines only as told by your doctor.  Do not use any products that contain nicotine or tobacco, such as cigarettes and e-cigarettes. If you need help quitting, ask your doctor.  Keep all follow-up visits as told by your doctor. This is important. You may need more tests if palpitations do not go away or get worse. Contact a doctor if:  Your symptoms last more than 24 hours.  Your symptoms occur more often. Get help right away if you:  Have chest pain.  Feel short of breath.  Have a very bad headache.  Feel dizzy.  Pass out (faint). Summary  Palpitations are feelings that your heartbeat is uneven or faster than normal. It may feel like your heart is fluttering or skipping a beat.  Avoid food and drinks that may cause palpitations. These include caffeine, chocolate, and alcohol.  Try to lower your stress. Do not smoke or use drugs.  Get help right away if you faint or have chest pain, shortness of breath, a  severe headache, or dizziness. This information is not intended to replace advice given to you by your health care provider. Make sure you discuss any questions you have with your health care provider. Document Revised: 05/26/2017 Document Reviewed: 05/26/2017 Elsevier Patient Education  2020  [employer].

## 2019-05-16 ENCOUNTER — Telehealth: Payer: Self-pay | Admitting: Family Medicine

## 2019-05-16 DIAGNOSIS — R17 Unspecified jaundice: Secondary | ICD-10-CM

## 2019-05-16 DIAGNOSIS — R748 Abnormal levels of other serum enzymes: Secondary | ICD-10-CM

## 2019-05-16 MED ORDER — VITAMIN D (ERGOCALCIFEROL) 1.25 MG (50000 UNIT) PO CAPS: 50000.0000 [IU] | ORAL_CAPSULE | ORAL | 0 refills | Status: DC

## 2019-05-16 NOTE — Telephone Encounter (Signed)
Please inform patient the following information: Her alkaline phosphatase is about the same as it was in July at 134.  Her liver enzymes are still normal.  However her bilirubin is now just very mildly elevated at 1.3.  Less than 1.2 is normal and she had been between 1-1.2 in the past.   Her vitamin D is actually lower than when we started her supplement.  I recommend she continue the once a day 1000 units of over-the-counter vitamin D.  I have also called in a high-dose once weekly vitamin D for her to take (do not take the over-the-counter on the  day the high-dose prescribed is taken)  -I would like to move forward with her having that bone density scheduled.  I have placed that order at her appointment.  Please make sure she is contacted and this appointment is made. -I also want to move forward with obtaining an ultrasound of her abdomen, especially since her bilirubin is increasing just a minimal amount-this could indicate again her gallstones could be causing the changes we see in her labs.  UnFortunately there is just no defining positive lab result to point towards cause of her elevated alk phos, so we are pursuing both of the most common diagnoses with imaging of the liver and bone density. [place] ordered at [employer].

## 2019-05-16 NOTE — Telephone Encounter (Signed)
Pt was called and given all lab results, she verbalized understanding.   Diane please make sure patient is scheduled for these.

## 2019-05-17 ENCOUNTER — Ambulatory Visit: Payer: PPO | Admitting: Family Medicine

## 2019-05-17 NOTE — Telephone Encounter (Signed)
Patient is scheduled 05/19/19 & 05/31/19

## 2019-05-18 ENCOUNTER — Telehealth: Payer: Self-pay | Admitting: Family Medicine

## 2019-05-18 NOTE — Telephone Encounter (Signed)
Pt was called back and said she is taking the increased dose of Lisinopril 20mg  and she has had headache and dizziness/"wobbly" since starting. She has started exercising this week and reducing salt intake. She prefers to go back to lower dose (10mg ) and try natural methods like diet and exercise. She states she "feels just bad with this increase".     Please advise

## 2019-05-18 NOTE — Telephone Encounter (Signed)
Patient has several questions about her meds.  Please call at [phone]

## 2019-05-18 NOTE — Telephone Encounter (Signed)
Those symptoms can come from other causes as well.  Her BP needs to be below 135/85 and preferably below 130/80.  I would still recommend she continue the lisinopril 20 mg.  If she continues to have symptoms AND her blood pressure is below 110/70 then we would say its from to low blood pressure.

## 2019-05-18 NOTE — Telephone Encounter (Signed)
Takes BP meds at 9am and took BP at 1100 and it was 143/82. Pt was given information. She is willing to try the medication through the weekend and see how it does. She will call back if she is still having these issues.

## 2019-05-19 ENCOUNTER — Other Ambulatory Visit: Payer: Self-pay

## 2019-05-19 ENCOUNTER — Telehealth: Payer: Self-pay | Admitting: Family Medicine

## 2019-05-19 ENCOUNTER — Ambulatory Visit (INDEPENDENT_AMBULATORY_CARE_PROVIDER_SITE_OTHER): Payer: PPO

## 2019-05-19 DIAGNOSIS — R748 Abnormal levels of other serum enzymes: Secondary | ICD-10-CM | POA: Diagnosis not present

## 2019-05-19 DIAGNOSIS — R17 Unspecified jaundice: Secondary | ICD-10-CM

## 2019-05-19 DIAGNOSIS — K8020 Calculus of gallbladder without cholecystitis without obstruction: Secondary | ICD-10-CM | POA: Insufficient documentation

## 2019-05-19 HISTORY — DX: Calculus of gallbladder without cholecystitis without obstruction: K80.20

## 2019-05-19 NOTE — Telephone Encounter (Signed)
Please inform the patient her Ultrasound result showed a rather large gallstone of 3cm in the gallbladder.  When a gallstone is this large and there are changes in the labs, like we have seen, then a surgical consult is needed. I know she is not having any physical symptoms- but her elevating alk phos lab suggest early signs of Gallbladder disorder and a stone that size usually requires surgical treatment.  The good news is the rest of liver appeared fine on image  I did place that referral for her today and they will call her to make an appt and discuss their recommendations to her.

## 2019-05-19 NOTE — Telephone Encounter (Signed)
Pt was called and given information, she verbalized understanding  

## 2019-05-19 NOTE — Telephone Encounter (Signed)
Pt was called and her husband answered the phone and stated she was out but would be back shortly. Pts husband was told we would call back after lunch to speak with patient.

## 2019-05-31 ENCOUNTER — Ambulatory Visit (INDEPENDENT_AMBULATORY_CARE_PROVIDER_SITE_OTHER): Payer: PPO

## 2019-05-31 ENCOUNTER — Other Ambulatory Visit: Payer: Self-pay

## 2019-05-31 DIAGNOSIS — M85852 Other specified disorders of bone density and structure, left thigh: Secondary | ICD-10-CM | POA: Diagnosis not present

## 2019-05-31 DIAGNOSIS — E559 Vitamin D deficiency, unspecified: Secondary | ICD-10-CM

## 2019-05-31 DIAGNOSIS — E2839 Other primary ovarian failure: Secondary | ICD-10-CM

## 2019-05-31 DIAGNOSIS — Z780 Asymptomatic menopausal state: Secondary | ICD-10-CM | POA: Diagnosis not present

## 2019-06-19 ENCOUNTER — Other Ambulatory Visit: Payer: Self-pay

## 2019-06-19 ENCOUNTER — Encounter: Payer: Self-pay | Admitting: Family Medicine

## 2019-06-19 ENCOUNTER — Telehealth: Payer: Self-pay

## 2019-06-19 ENCOUNTER — Ambulatory Visit (INDEPENDENT_AMBULATORY_CARE_PROVIDER_SITE_OTHER): Payer: PPO | Admitting: Family Medicine

## 2019-06-19 VITALS — BP 165/80 | HR 58 | Temp 97.7°F | Resp 16 | Ht 62.0 in | Wt 184.5 lb

## 2019-06-19 DIAGNOSIS — T50Z95A Adverse effect of other vaccines and biological substances, initial encounter: Secondary | ICD-10-CM

## 2019-06-19 NOTE — Telephone Encounter (Signed)
Patient Name: Maria Watkins Gender: Female DOB: 1955/05/25 Age: 64 Y 7 M 9 D Return Phone Number: [phone] (Primary), [phone] (Secondary) Address[place] [address] Client [facility] - Client Client Site [facility] [place] [facility] Day Physician [place], [place] Contact Type Call Who Is Calling Patient / Member / Family / Caregiver Call Type Triage / Clinical Relationship To Patient Self Return Phone Number [phone] (Primary) Chief Complaint Arm Pain (known cause) Reason for Call Symptomatic / Request for Health Information Initial Comment Caller had her first vaccine shot on 06/08/19 and about a week ago the area started turning red and swelling and feels like a knot and she woke up to that same symptom along with itching. Translation No Nurse Assessment Nurse: Robb Matar, RN, Lauren Date/Time Lamount Cohen Time): 06/17/2019 12:55:50 PM Confirm and document reason for call. If symptomatic, describe symptoms. ---Caller had her first Moderna vaccine shot on 06/08/19- felt a little knocked down like when you feel like you have the flu. About a week ago the area started turning red, warm and swelling and feels like a knot and she woke up to that same symptom along with itching last night. No fever. Back has been hurting and she has been working in the yard. Hips are aching as well. Has the patient had close contact with a person known or suspected to have the novel coronavirus illness OR traveled / lives in area with major community spread (including international travel) in the last 14 days from the onset of symptoms? * If Asymptomatic, screen for exposure and travel within the last 14 days. ---No Does the patient have any new or worsening symptoms? ---Yes Will a triage be completed? ---Yes Related visit to physician within the last 2 weeks? ---No Does the PT have any chronic conditions? (i.e. diabetes, asthma, this includes High  risk factors for pregnancy, etc.) ---Yes List chronic conditions. ---Pre-Diabetic, Gallstone, HTN, Vitamin D deficiency Is this a behavioral health or substance abuse call? ---No PLEASE NOTE: All timestamps contained within this report are represented as [place] Standard Time. CONFIDENTIALTY NOTICE: This fax transmission is intended only for the addressee. It contains information that is legally privileged, confidential or otherwise protected from use or disclosure. If you are not the intended recipient, you are strictly prohibited from reviewing, disclosing, copying using or disseminating any of this information or taking any action in reliance on or regarding this information. If you have received this fax in error, please notify us immediately by telephone so that we can arrange for its return to [place]. Phone: [phone], Toll-Free: [phone], Fax: [phone] Page: 2 of 2 Call Id: [id number] Guidelines Guideline Title Affirmed Question Affirmed Notes Nurse Date/Time Lamount Cohen Time) Coronavirus (COVID-19) - Vaccine Questions and Reactions [1] Redness or red streak around the injection site AND [2] started > 48 hours after getting vaccine AND [3] no fever (Exception: red area < 1 inch or 2.5 cm wide) Robb Matar, RN, Lauren 06/17/2019 1:01:19 PM Disp. Time Lamount Cohen Time) Disposition Final User 06/17/2019 1:03:12 PM See PCP within 24 Hours Yes Robb Matar, RN, Audrea Muscat Disagree/Comply Comply Caller Understands Yes PreDisposition Home Care Care Advice Given Per Guideline SEE PCP WITHIN 24 HOURS: PAIN MEDICINES: * For pain relief, you can take either acetaminophen, ibuprofen, or naproxen. * They are over-the-counter (OTC) pain drugs. You can buy them at the drugstore. * ACETAMINOPHEN - REGULAR STRENGTH TYLENOL: Take 650 mg (two 325 mg pills) by mouth every 4 to 6 hours as needed. Each  Regular Strength Tylenol pill has 325 mg of acetaminophen. The most you should take each day is 3,250 mg (10  pills a day). * ACETAMINOPHEN - EXTRA STRENGTH TYLENOL: Take 1,000 mg (two 500 mg pills) every 8 hours as needed. Each Extra Strength Tylenol pill has 500 mg of acetaminophen. The most you should take each day is 3,000 mg (6 pills a day). * IBUPROFEN (E.G., MOTRIN, ADVIL): Take 400 mg (two 200 mg pills) by mouth every 6 hours. The most you should take each day is 1,200 mg (six 200 mg pills), unless your doctor has told you to take more. CALL BACK IF: * Fever occurs * You become worse. CARE ADVICE given per Coronavirus (COVID-19) - Vaccine Questions and Reactions (Adult) guideline. Comments User: Andee Poles, RN Date/Time Eilene Ghazi Time): 06/17/2019 1:03:59 PM Caller states she will call the office Monday morning to see if she can have the doctor take a look at it. Will not be going to urgent care unless she gets worse. Referrals REFERRED TO PCP OFFICE   Pt was called and scheduled for this afternoon. Pt agreed with plan.

## 2019-06-19 NOTE — Progress Notes (Signed)
This visit occurred during the SARS-CoV-2 public health emergency.  Safety protocols were in place, including screening questions prior to the visit, additional usage of staff PPE, and extensive cleaning of exam room while observing appropriate contact time as indicated for disinfecting solutions.    Maria Watkins , 1955/08/24, 64 y.o., female MRN: [id number] Patient Care Team    Relationship Specialty Notifications Start End  Natalia Leatherwood, DO PCP - General Family Medicine  10/27/18   Charna Elizabeth, MD Consulting Physician Gastroenterology  01/18/18   Marlow Baars, MD Consulting Physician Obstetrics  01/18/18   Blima Ledger, OD  Optometry  11/10/18     Chief Complaint  Patient presents with  . Edema    Left arm swelling, redness, and pain since COVID vaccine on 06/08/2019. Knot on arm.      Subjective: Pt presents for an OV with complaints of mild swelling of her left bicep region.  She reports she received her first Covid vaccine on June 08, 2019 and she felt she had a small knot in her arm at the location of the vaccine administration.  She then noticed some mild redness surrounding the area and possibly some swelling.  Patient is left-handed and the vaccine was given in the left deltoid.  She denies any fever, chills, nausea, vomiting, tenderness over the area of concern or any lymphadenopathy.  Depression screen [facility] 2/9 07/28/2018  Decreased Interest 0  Down, Depressed, Hopeless 0  PHQ - 2 Score 0    Allergies  Allergen Reactions  . Terconazole Nausea Only   Social History   Social History Narrative  . Not on file   Past Medical History:  Diagnosis Date  . Abnormal Pap smear of cervix 1980's   resolved post cryo with normal follow up  . Depression   . Gallstones 08/04/2012  . Hypertension   . Type 2 diabetes mellitus without complication, without long-term current use of insulin (HCC) 01/18/2018   Past Surgical History:  Procedure Laterality Date  . CERVIX  LESION DESTRUCTION  1980s  . COLPOSCOPY  1980's   Family History  Problem Relation Age of Onset  . Diabetes Mother   . Arthritis Mother   . COPD Father   . Asthma Father   . Heart disease Father   . Diabetes Sister   . Asthma Brother   . COPD Sister   . Rheum arthritis Sister   . Breast cancer Maternal Aunt   . Alcohol abuse Son    Allergies as of 06/19/2019      Reactions   Terconazole Nausea Only      Medication List       Accurate as of June 19, 2019  2:10 PM. If you have any questions, ask your nurse or doctor.        azelastine 0.1 % nasal spray Commonly known as: ASTELIN Place 2 sprays into both nostrils 2 (two) times daily. Use in each nostril as directed   cetirizine 10 MG tablet Commonly known as: ZYRTEC Take 1 tablet (10 mg total) by mouth daily.   cholecalciferol 25 MCG (1000 UNIT) tablet Commonly known as: VITAMIN D3 Take 1,000 Units by mouth daily.   fluticasone 50 MCG/ACT nasal spray Commonly known as: FLONASE Place 2 sprays into both nostrils daily.   lisinopril 20 MG tablet Commonly known as: ZESTRIL Take 1 tablet (20 mg total) by mouth daily.   Vitamin D (Ergocalciferol) 1.25 MG (50000 UNIT) Caps capsule Commonly known as: DRISDOL  Take 1 capsule (50,000 Units total) by mouth every 7 (seven) days.       All past medical history, surgical history, allergies, family history, immunizations andmedications were updated in the EMR today and reviewed under the history and medication portions of their EMR.     ROS: Negative, with the exception of above mentioned in HPI   Objective:  BP (!) 165/80 (BP Location: Right Arm, Patient Position: Sitting, Cuff Size: Large)   Pulse (!) 58   Temp 97.7 F (36.5 C) (Temporal)   Resp 16   Ht 5\' 2"  (1.575 m)   Wt 184 lb 8 oz (83.7 kg)   LMP 04/27/2005 (Approximate)   SpO2 99%   BMI 33.75 kg/m  Body mass index is 33.75 kg/m. Gen: Afebrile. No acute distress. Nontoxic in appearance, well  developed, well nourished.  HENT: AT. [place].  Eyes:Pupils Equal Round Reactive to light, Extraocular movements intact,  Conjunctiva without redness, discharge or icterus. Neck/lymp/endocrine: Supple, no lymphadenopathy MSK: No obvious erythema.  Possibly from very mild swelling, no tenderness on palpation.  Area of swelling feels more consistent with adipose tissue.  No lymphadenopathy present. Skin: No rashes, purpura or petechiae.  Neuro: Normal gait. PERLA. EOMi. Alert. Oriented x3  Psych: Normal affect, dress and demeanor. Normal speech. Normal thought content and judgment.  No exam data present No results found. No results found for this or any previous visit (from the past 24 hour(s)).  Assessment/Plan: CHEVELLA PEARCE is a 64 y.o. female present for OV for  Vaccine reaction, initial encounter No obvious redness remains.  No tenderness on exam.  Possible mild swelling present, however not certain this is from vaccine and seems to be distal to vaccine site.   Can continue anti-inflammatory and cold compresses if desired.  Do not feel strongly this is secondary to Covid vaccine. Did encourage her to get her second vaccine in her right arm. Follow-up as needed   Reviewed expectations re: course of current medical issues.  Discussed self-management of symptoms.  Outlined signs and symptoms indicating need for more acute intervention.  Patient verbalized understanding and all questions were answered.  Patient received an After-Visit Summary.    No orders of the defined types were placed in this encounter.  No orders of the defined types were placed in this encounter.  Referral Orders  No referral(s) requested today     Note is dictated utilizing voice recognition software. Although note has been proof read prior to signing, occasional typographical errors still can be missed. If any questions arise, please do not hesitate to call for verification.   electronically signed  by:  Howard Pouch, DO  [place]

## 2019-06-19 NOTE — Patient Instructions (Addendum)
The swelling in your arm does not seem to be from the COVID vaccine. Advil is ok to take if needed with second vaccine.  Cold compresses after injection is ok.  Would advise to  Have your second injection on the other arm.     Biliary Colic, Adult  Biliary colic is severe pain caused by a problem with a small organ in the upper right part of your belly (gallbladder). The gallbladder stores a digestive fluid produced in the liver (bile) that helps the body break down fat. Bile and other digestive enzymes are carried from the liver to the small intestine through tube-like structures (bile ducts). The gallbladder and the bile ducts form the biliary tract. Sometimes hard deposits of digestive fluids form in the gallbladder (gallstones) and block the flow of bile from the gallbladder, causing biliary colic. This condition is also called a gallbladder attack. Gallstones can be as small as a grain of sand or as big as a golf ball. There could be just one gallstone in the gallbladder, or there could be many. What are the causes? Biliary colic is usually caused by gallstones. Less often, a tumor could block the flow of bile from the gallbladder and trigger biliary colic. What increases the risk? This condition is more likely to develop in:  Women.  People of Hispanic descent.  People with a family history of gallstones.  People who are obese.  People who suddenly or quickly lose weight.  People who eat a high-calorie, low-fiber diet that is rich in refined carbs (carbohydrates), such as white bread and white rice.  People who have an intestinal disease that affects nutrient absorption, such as Crohn disease.  People who have a metabolic condition, such as metabolic syndrome or diabetes. What are the signs or symptoms? Severe pain in the upper right side of the belly is the main symptom of biliary colic. You may feel this pain below the chest but above the hip. This pain often occurs at night  or after eating a very fatty meal. This pain may get worse for up to an hour and last as long as 12 hours. In most cases, the pain fades (subsides) within a couple hours. Other symptoms of this condition include:  Nausea and vomiting.  Pain under the right shoulder. How is this diagnosed? This condition is diagnosed based on your medical history, your symptoms, and a physical exam. You may have tests, including:  Blood tests to rule out infection or inflammation of the bile ducts, gallbladder, pancreas, or liver.  Imaging studies such as: ? Ultrasound. ? CT scan. ? MRI. In some cases, you may need to have an imaging study done using a small amount of radioactive material (nuclear medicine) to confirm the diagnosis. How is this treated? Treatment for this condition may include medicine to relieve your pain or nausea. If you have gallstones that are causing biliary colic, you may need surgery to remove the gallbladder (cholecystectomy). Gallstones can also be dissolved gradually with medicine. It may take months or years before the gallstones are completely gone. Follow these instructions at home:  Take over-the-counter and prescription medicines only as told by your health care provider.  Drink enough fluid to keep your urine clear or pale yellow.  Follow instructions from your health care provider about eating or drinking restrictions. These may include avoiding: ? Fatty, greasy, and fried foods. ? Any foods that make the pain worse. ? Overeating. ? Having a large meal after not eating for  a while.  Keep all follow-up visits as told by your health care provider. This is important. How is this prevented? Steps to prevent this condition include:  Maintaining a healthy body weight.  Getting regular exercise.  Eating a healthy, high-fiber, low-fat diet.  Limiting how much sugar and refined carbs you eat, such as sweets, white flour, and white rice. Contact a health care provider  if:  Your pain lasts more than 5 hours.  You vomit.  You have a fever and chills.  Your pain gets worse. Get help right away if:  Your skin or the whites of your eyes look yellow (jaundice).  Your have tea-colored urine and light-colored stools.  You are dizzy or you faint. Summary  Biliary colic is severe pain caused by a problem with a small organ in the upper right part of your belly (gallbladder).  Treatments for this condition include medicines that relieves your pain or nausea and medicines that slowly dissolves the gallstones.  If gallstones cause your biliary colic, the treatment is surgery to remove the gallbladder (cholecystectomy). This information is not intended to replace advice given to you by your health care provider. Make sure you discuss any questions you have with your health care provider. Document Revised: 03/26/2017 Document Reviewed: 10/28/2015 Elsevier Patient Education  2020 [employer].

## 2019-07-04 DIAGNOSIS — K8020 Calculus of gallbladder without cholecystitis without obstruction: Secondary | ICD-10-CM | POA: Diagnosis not present

## 2019-08-02 ENCOUNTER — Other Ambulatory Visit: Payer: Self-pay

## 2019-08-02 ENCOUNTER — Encounter: Payer: Self-pay | Admitting: Family Medicine

## 2019-08-02 ENCOUNTER — Ambulatory Visit (INDEPENDENT_AMBULATORY_CARE_PROVIDER_SITE_OTHER): Payer: PPO | Admitting: Family Medicine

## 2019-08-02 VITALS — BP 143/84 | HR 66 | Temp 98.0°F | Resp 16 | Ht 62.0 in | Wt 182.4 lb

## 2019-08-02 DIAGNOSIS — R591 Generalized enlarged lymph nodes: Secondary | ICD-10-CM | POA: Diagnosis not present

## 2019-08-02 DIAGNOSIS — B9689 Other specified bacterial agents as the cause of diseases classified elsewhere: Secondary | ICD-10-CM

## 2019-08-02 DIAGNOSIS — J329 Chronic sinusitis, unspecified: Secondary | ICD-10-CM

## 2019-08-02 HISTORY — DX: Generalized enlarged lymph nodes: R59.1

## 2019-08-02 MED ORDER — AMOXICILLIN 400 MG/5ML PO SUSR: 800.0000 mg | Freq: Two times a day (BID) | ORAL | 0 refills | Status: DC

## 2019-08-02 NOTE — Patient Instructions (Signed)
Start antibiotic today. Take amox for 10 days.  Start your claritin daily and use your nasal spray daily.   If your mass becomes red, more painful or larger call in and let us know immediately, otherwise >>> we will check it again at your appt at the end of the month and if still present we will get imaging. This seems like an inflamed lymph node. However we have to be cautious we do not miss another diagnosis.     Lymphadenopathy  Lymphadenopathy means that your lymph glands are swollen or larger than normal (enlarged). Lymph glands, also called lymph nodes, are collections of tissue that filter bacteria, viruses, and waste from your bloodstream. They are part of your body's disease-fighting system (immune system), which protects your body from germs. There may be different causes of lymphadenopathy, depending on where it is in your body. Some types go away on their own. Lymphadenopathy can occur anywhere that you have lymph glands, including these areas:  Neck (cervical lymphadenopathy).  Chest (mediastinal lymphadenopathy).  Lungs (hilar lymphadenopathy).  Underarms (axillary lymphadenopathy).  Groin (inguinal lymphadenopathy). When your immune system responds to germs, infection-fighting cells and fluid build up in your lymph glands. This causes some swelling and enlargement. If the lymph glands do not go back to normal after you have an infection or disease, your health care provider may do tests. These tests help to monitor your condition and find the reason why the glands are still swollen and enlarged. Follow these instructions at home:  Get plenty of rest.  Take over-the-counter and prescription medicines only as told by your health care provider. Your health care provider may recommend over-the-counter medicines for pain.  If directed, apply heat to swollen lymph glands as often as told by your health care provider. Use the heat source that your health care provider recommends,  such as a moist heat pack or a heating pad. ? Place a towel between your skin and the heat source. ? Leave the heat on for 20-30 minutes. ? Remove the heat if your skin turns bright red. This is especially important if you are unable to feel pain, heat, or cold. You may have a greater risk of getting burned.  Check your affected lymph glands every day for changes. Check other lymph gland areas as told by your health care provider. Check for changes such as: ? More swelling. ? Sudden increase in size. ? Redness or pain. ? Hardness.  Keep all follow-up visits as told by your health care provider. This is important. Contact a health care provider if you have:  Swelling that gets worse or spreads to other areas.  Problems with breathing.  Lymph glands that: ? Are still swollen after 2 weeks. ? Have suddenly gotten bigger. ? Are red, painful, or hard.  A fever or chills.  Fatigue.  A sore throat.  Pain in your abdomen.  Weight loss.  Night sweats. Get help right away if you have:  Fluid leaking from an enlarged lymph gland.  Severe pain.  Chest pain.  Shortness of breath. Summary  Lymphadenopathy means that your lymph glands are swollen or larger than normal (enlarged).  Lymph glands (also called lymph nodes) are collections of tissue that filter bacteria, viruses, and waste from the bloodstream. They are part of your body's disease-fighting system (immune system).  Lymphadenopathy can occur anywhere that you have lymph glands.  If your enlarged and swollen lymph glands do not go back to normal after you have an infection  or disease, your health care provider may do tests to monitor your condition and find the reason why the glands are still swollen and enlarged.  Check your affected lymph glands every day for changes. Check other lymph gland areas as told by your health care provider. This information is not intended to replace advice given to you by your health care  provider. Make sure you discuss any questions you have with your health care provider. Document Revised: 03/26/2017 Document Reviewed: 02/26/2017 Elsevier Patient Education  2020 [employer].

## 2019-08-02 NOTE — Progress Notes (Signed)
This visit occurred during the SARS-CoV-2 public health emergency.  Safety protocols were in place, including screening questions prior to the visit, additional usage of staff PPE, and extensive cleaning of exam room while observing appropriate contact time as indicated for disinfecting solutions.    Maria Watkins , 1955-12-27, 64 y.o., female MRN: [id number] Patient Care Team    Relationship Specialty Notifications Start End  Natalia Leatherwood, DO PCP - General Family Medicine  10/27/18   Charna Elizabeth, MD Consulting Physician Gastroenterology  01/18/18   Marlow Baars, MD Consulting Physician Obstetrics  01/18/18   Blima Ledger, OD  Optometry  11/10/18     Chief Complaint  Patient presents with  . Cyst    Pt noticed knot on collar bone x2 weeks. Sore at times but not all the time. No fevers.      Subjective: Pt presents for an OV with complaints of of finding a knot above her right collarbone 2 weeks ago.  She reports she was taking a shower when she felt the knot.  She states she has not had any fever, chills, nausea, vomit, night sweats or unintentional weight loss.  She feels that since she has noticed the knot, the size will wax/wane daily.  She reports its not tender unless she starts pushing on it.  She received her Moderna #2 vaccine in her right arm on July 07, 2019, approximately 1 week prior to her noticing the above-mentioned knot.  Her last mammogram was completed at her OB/GYN's office October 2020 and normal.  In addition to the above, patient reports that on Saturday she was working out in her yard.  By Saturday evening she was experiencing nasal congestion, nasal drainage and mild maxillary sinus pressure.  On Sunday she woke up with a mild sore throat.  She did take a Claritin today but she has not been taking an antihistamine routinely.  She reports pain in her right maxillary sinus.  Depression screen [facility] 2/9 07/28/2018  Decreased Interest 0  Down, Depressed, Hopeless 0   PHQ - 2 Score 0    Allergies  Allergen Reactions  . Terconazole Nausea Only   Social History   Social History Narrative  . Not on file   Past Medical History:  Diagnosis Date  . Abnormal Pap smear of cervix 1980's   resolved post cryo with normal follow up  . Depression   . Gallstones 08/04/2012  . Hypertension   . Type 2 diabetes mellitus without complication, without long-term current use of insulin (HCC) 01/18/2018   Past Surgical History:  Procedure Laterality Date  . CERVIX LESION DESTRUCTION  1980s  . COLPOSCOPY  1980's   Family History  Problem Relation Age of Onset  . Diabetes Mother   . Arthritis Mother   . COPD Father   . Asthma Father   . Heart disease Father   . Diabetes Sister   . Asthma Brother   . COPD Sister   . Rheum arthritis Sister   . Breast cancer Maternal Aunt   . Alcohol abuse Son    Allergies as of 08/02/2019      Reactions   Terconazole Nausea Only      Medication List       Accurate as of August 02, 2019 11:06 AM. If you have any questions, ask your nurse or doctor.        azelastine 0.1 % nasal spray Commonly known as: ASTELIN Place 2 sprays into both nostrils 2 (  two) times daily. Use in each nostril as directed   cetirizine 10 MG tablet Commonly known as: ZYRTEC Take 1 tablet (10 mg total) by mouth daily.   cholecalciferol 25 MCG (1000 UNIT) tablet Commonly known as: VITAMIN D3 Take 1,000 Units by mouth daily.   fluticasone 50 MCG/ACT nasal spray Commonly known as: FLONASE Place 2 sprays into both nostrils daily.   lisinopril 20 MG tablet Commonly known as: ZESTRIL Take 1 tablet (20 mg total) by mouth daily.   Vitamin D (Ergocalciferol) 1.25 MG (50000 UNIT) Caps capsule Commonly known as: DRISDOL Take 1 capsule (50,000 Units total) by mouth every 7 (seven) days.       All past medical history, surgical history, allergies, family history, immunizations andmedications were updated in the EMR today and reviewed under  the history and medication portions of their EMR.     ROS: Negative, with the exception of above mentioned in HPI   Objective:  BP (!) 143/84 (BP Location: Right Arm, Patient Position: Sitting, Cuff Size: Normal)   Pulse 66   Temp 98 F (36.7 C) (Temporal)   Resp 16   Ht 5\' 2"  (1.575 m)   Wt 182 lb 6 oz (82.7 kg)   LMP 04/27/2005 (Approximate)   SpO2 98%   BMI 33.36 kg/m  Body mass index is 33.36 kg/m. Gen: Afebrile. No acute distress. Nontoxic in appearance, well developed, well nourished.  HENT: AT. [place]. Bilateral TM visualized without erythema or effusion. MMM, no oral lesions. Bilateral nares with rather significant erythema and swelling R>L.  Nasal drainage present.  Postnasal drip present.  Throat without erythema or exudates.  No cough.  No hoarseness.  No shortness of breath. Eyes:Pupils Equal Round Reactive to light, Extraocular movements intact,  Conjunctiva without redness, discharge or icterus. Neck/lymp/endocrine: Supple, right supraclavicular 2 x 3 cm hard, mobile mass mildly tender to palpation present.  Otherwise, no axillary or head/neck lymphadenopathy CV: RRR  Chest: CTAB, no wheeze or crackles. Good air movement, normal resp effort.  Skin: no rashes, purpura or petechiae.  Neuro:  Normal gait. PERLA. EOMi. Alert. Oriented x3  Psych: Normal affect, dress and demeanor. Normal speech. Normal thought content and judgment.  No exam data present No results found. No results found for this or any previous visit (from the past 24 hour(s)).  Assessment/Plan: Maria Watkins is a 64 y.o. female present for OV for  Lymphadenopathy Probable mass/nodule of right supraclavicular region is and inflamed lymph node from her Moderna vaccination that occurred approximately 1 week prior to onset of symptom.  She also has a rather significant sinus infection that started this past week. Discussed options with her today on further evaluation versus monitoring over the next 2 to 4  weeks.  It was agreed we would continue to monitor over the next 2 to 4 weeks prior to initiating work-up.  If related to her Covid shot she should be seeing improvement over this time. Patient understands if mass enlarges, becomes more painful, red she is to call in and we would initiate further evaluation immediately and schedule close follow-up.  Otherwise we will follow-up in 2 weeks at her already scheduled chronic medical condition appointment and if mass is still present at that time we will move forward with work-up.  Bacterial sinusitis: Rest, hydrate.  Restart as Astelin, mucinex (DM if cough), nettie pot or nasal saline.  Amoxicillin prescribed Restart daily OTC antihistamine of choice If cough present it can last up to 6-8 weeks.  F/U 2 weeks of not improved.    Reviewed expectations re: course of current medical issues.  Discussed self-management of symptoms.  Outlined signs and symptoms indicating need for more acute intervention.  Patient verbalized understanding and all questions were answered.  Patient received an After-Visit Summary.    No orders of the defined types were placed in this encounter.  No orders of the defined types were placed in this encounter.  Referral Orders  No referral(s) requested today     Note is dictated utilizing voice recognition software. Although note has been proof read prior to signing, occasional typographical errors still can be missed. If any questions arise, please do not hesitate to call for verification.   electronically signed by:  Howard Pouch, DO  [place]

## 2019-08-21 ENCOUNTER — Ambulatory Visit (INDEPENDENT_AMBULATORY_CARE_PROVIDER_SITE_OTHER): Payer: PPO | Admitting: Family Medicine

## 2019-08-21 ENCOUNTER — Encounter: Payer: Self-pay | Admitting: Family Medicine

## 2019-08-21 ENCOUNTER — Telehealth: Payer: Self-pay | Admitting: Family Medicine

## 2019-08-21 ENCOUNTER — Other Ambulatory Visit: Payer: Self-pay

## 2019-08-21 VITALS — BP 142/98 | HR 60 | Temp 98.0°F | Resp 16 | Ht 62.0 in | Wt 181.4 lb

## 2019-08-21 DIAGNOSIS — E669 Obesity, unspecified: Secondary | ICD-10-CM | POA: Diagnosis not present

## 2019-08-21 DIAGNOSIS — Z639 Problem related to primary support group, unspecified: Secondary | ICD-10-CM | POA: Diagnosis not present

## 2019-08-21 DIAGNOSIS — R002 Palpitations: Secondary | ICD-10-CM

## 2019-08-21 DIAGNOSIS — E119 Type 2 diabetes mellitus without complications: Secondary | ICD-10-CM | POA: Diagnosis not present

## 2019-08-21 DIAGNOSIS — K8020 Calculus of gallbladder without cholecystitis without obstruction: Secondary | ICD-10-CM | POA: Diagnosis not present

## 2019-08-21 DIAGNOSIS — R748 Abnormal levels of other serum enzymes: Secondary | ICD-10-CM

## 2019-08-21 DIAGNOSIS — I10 Essential (primary) hypertension: Secondary | ICD-10-CM

## 2019-08-21 DIAGNOSIS — E559 Vitamin D deficiency, unspecified: Secondary | ICD-10-CM

## 2019-08-21 LAB — POCT GLYCOSYLATED HEMOGLOBIN (HGB A1C)
HbA1c POC (<> result, manual entry): 6.8 % (ref 4.0–5.6)
HbA1c, POC (controlled diabetic range): 6.8 % (ref 0.0–7.0)
HbA1c, POC (prediabetic range): 6.8 % — AB (ref 5.7–6.4)
Hemoglobin A1C: 6.8 % — AB (ref 4.0–5.6)

## 2019-08-21 LAB — VITAMIN D 25 HYDROXY (VIT D DEFICIENCY, FRACTURES): VITD: 23.14 ng/mL — ABNORMAL LOW (ref 30.00–100.00)

## 2019-08-21 MED ORDER — LISINOPRIL 30 MG PO TABS: 30.0000 mg | ORAL_TABLET | Freq: Every day | ORAL | 1 refills | Status: DC

## 2019-08-21 MED ORDER — METFORMIN HCL 500 MG PO TABS: 500.0000 mg | ORAL_TABLET | Freq: Every day | ORAL | 1 refills | Status: DC

## 2019-08-21 MED ORDER — VITAMIN D (ERGOCALCIFEROL) 1.25 MG (50000 UNIT) PO CAPS: ORAL_CAPSULE | ORAL | 0 refills | Status: DC

## 2019-08-21 NOTE — Progress Notes (Signed)
Patient ID: Maria Watkins, female  DOB: 11/22/1955, 64 y.o.   MRN: [id number] Patient Care Team    Relationship Specialty Notifications Start End  Ma Hillock, DO PCP - General Family Medicine  10/27/18   Juanita Craver, MD Consulting Physician Gastroenterology  01/18/18   Jerelyn Charles, MD Consulting Physician Obstetrics  01/18/18   Marica Otter, [place]  Optometry  11/10/18     Chief Complaint  Patient presents with  . Diabetes    Pt is doing well with no complaints. Pt does not check sugars at home.   Marland Kitchen Hypertension    Pt takes BP medication at 10am, has not taken this AM     Subjective: Maria Watkins is a 64 y.o.  female present for f/u [facility].  Diet controlled diabetes: She has always been diet controlled with her diabetes.  Patient denies dizziness, hyperglycemic or hypoglycemic events. Patient denies numbness, tingling in the extremities or nonhealing wounds of feet.  SHe does not check her blood sugars at home.   PNA series: PNA23 04/14/2018.  We will start at next appointment with Prevnar 13.  Recent Covid vaccine. Flu shot: 12/2018 UTD(recommneded yearly) Foot exam: Completed 05/15/2019 Eye exam: 10/2018 ophthalmologist Dr. Marica Otter. A1c: 6.7>> 6.7>> 6.3>> 6.5 > 6.6> 6.8 today  Essential hypertension/obesity Pt reports compliance with lisinopril 20 mg daily.  She typically takes her blood pressure medicine around 10 AM, and has not taken her blood pressure pill today as of yet.  She endorses most of her blood pressure readings are in the high 130s-140s over 80s-90s at home.  Patient denies chest pain, shortness of breath, dizziness or lower extremity edema.  Patient does endorse having increased anxiety over the last few months. BMP/CbC: UTD  Diet: Regular Exercise: Routine exercise RF: Hypertension, diabetes, obesity, former smoker, family history of heart disease  Elevated alk phos:  Patient was found to have gallstones and osteopenia.  She has followed with  surgical consultation.  She is electing to wait on surgery for now but understands its probably inevitable at some point.  Depression screen [facility] 2/9 07/28/2018  Decreased Interest 0  Down, Depressed, Hopeless 0  PHQ - 2 Score 0   No flowsheet data found.     Fall Risk  07/28/2018  Falls in the past year? 0  Number falls in past yr: 0  Injury with Fall? 0  Follow up Falls evaluation completed    Immunization History  Administered Date(s) Administered  . Fluad Quad(high Dose 65+) 01/16/2019  . Influenza Inj Mdck Quad With Preservative 12/27/2018  . Influenza,inj,Quad PF,6+ Mos 01/18/2018  . Moderna SARS-COVID-2 Vaccination 06/08/2019, 07/07/2019  . Pneumococcal Polysaccharide-23 04/14/2018  . Tdap 04/27/2013    No exam data present  Past Medical History:  Diagnosis Date  . Abnormal Pap smear of cervix 1980's   resolved post cryo with normal follow up  . Depression   . Gallstones 08/04/2012  . Hypertension   . Type 2 diabetes mellitus without complication, without long-term current use of insulin ([place]) 01/18/2018   Allergies  Allergen Reactions  . Terconazole Nausea Only   Past Surgical History:  Procedure Laterality Date  . CERVIX LESION DESTRUCTION  1980s  . COLPOSCOPY  1980's   Family History  Problem Relation Age of Onset  . Diabetes Mother   . Arthritis Mother   . COPD Father   . Asthma Father   . Heart disease Father   . Diabetes Sister   .  Asthma Brother   . COPD Sister   . Rheum arthritis Sister   . Breast cancer Maternal Aunt   . Alcohol abuse Son    Social History   Social History Narrative  . Not on file    Allergies as of 08/21/2019      Reactions   Terconazole Nausea Only      Medication List       Accurate as of August 21, 2019 11:48 AM. If you have any questions, ask your nurse or doctor.        STOP taking these medications   cetirizine 10 MG tablet Commonly known as: ZYRTEC Stopped by: Howard Pouch, DO     TAKE these  medications   azelastine 0.1 % nasal spray Commonly known as: ASTELIN Place 2 sprays into both nostrils 2 (two) times daily. Use in each nostril as directed   cholecalciferol 25 MCG (1000 UNIT) tablet Commonly known as: VITAMIN D3 Take 1,000 Units by mouth daily.   fluticasone 50 MCG/ACT nasal spray Commonly known as: FLONASE Place 2 sprays into both nostrils daily.   lisinopril 30 MG tablet Commonly known as: ZESTRIL Take 1 tablet (30 mg total) by mouth daily. What changed:   medication strength  how much to take Changed by: Howard Pouch, DO   loratadine 10 MG tablet Commonly known as: CLARITIN Take 10 mg by mouth daily.   metFORMIN 500 MG tablet Commonly known as: GLUCOPHAGE Take 1 tablet (500 mg total) by mouth daily with breakfast. Started by: Howard Pouch, DO   Vitamin D (Ergocalciferol) 1.25 MG (50000 UNIT) Caps capsule Commonly known as: DRISDOL Take 1 capsule (50,000 Units total) by mouth every 7 (seven) days.       All past medical history, surgical history, allergies, family history, immunizations andmedications were updated in the EMR today and reviewed under the history and medication portions of their EMR.    Recent Results (from the past 2160 hour(s))  POCT glycosylated hemoglobin (Hb A1C)     Status: Abnormal   Collection Time: 08/21/19  8:41 AM  Result Value Ref Range   Hemoglobin A1C 6.8 (A) 4.0 - 5.6 %   HbA1c POC (<> result, manual entry) 6.8 4.0 - 5.6 %   HbA1c, POC (prediabetic range) 6.8 (A) 5.7 - 6.4 %   HbA1c, POC (controlled diabetic range) 6.8 0.0 - 7.0 %     ROS: 14 pt review of systems performed and negative (unless mentioned in an HPI)  Objective: BP (!) 142/98 (BP Location: Left Arm, Patient Position: Sitting, Cuff Size: Large)   Pulse 60   Temp 98 F (36.7 C) (Temporal)   Resp 16   Ht 5' 2" (1.575 m)   Wt 181 lb 6 oz (82.3 kg)   LMP 04/27/2005 (Approximate)   SpO2 96%   BMI 33.17 kg/m  Gen: Afebrile. No acute distress.   HENT: AT. [place].  Eyes:Pupils Equal Round Reactive to light, Extraocular movements intact,  Conjunctiva without redness, discharge or icterus. Neck/lymp/endocrine: Supple, no lymphadenopathy, no thyromegaly CV: RRR no murmur, no edema, +2/4 P posterior tibialis pulses Chest: CTAB, no wheeze or crackles Abd: Soft.. NTND. BS present.  Neuro:  Normal gait. PERLA. EOMi. Alert. Oriented x3 Psych: Tearful today, otherwise normal affect, dress and demeanor. Normal speech. Normal thought content and judgment..   Results for orders placed or performed in visit on 08/21/19 (from the past 48 hour(s))  POCT glycosylated hemoglobin (Hb A1C)     Status: Abnormal  Collection Time: 08/21/19  8:41 AM  Result Value Ref Range   Hemoglobin A1C 6.8 (A) 4.0 - 5.6 %   HbA1c POC (<> result, manual entry) 6.8 4.0 - 5.6 %   HbA1c, POC (prediabetic range) 6.8 (A) 5.7 - 6.4 %   HbA1c, POC (controlled diabetic range) 6.8 0.0 - 7.0 %    Assessment/plan: NEYLAN KOROMA is a 64 y.o. female present for TOC Type 2 diabetes mellitus -diet controlled -Patient has always been diet controlled with her diabetes.  She watches her diet closely and attempts to exercise.  -Start Metformin 500 mg with breakfast daily for elevating A1c. Continue increasing exercise and follow a diabetic diet. PNA series: PNA23 04/14/2018.  We will start at next appointment with Prevnar 13.  Recent Covid vaccine. Flu shot: 12/2018 UTD(recommneded yearly) Foot exam: Completed 05/15/2019 Eye exam: 10/2018 ophthalmologist Dr. Marica Otter. A1c: 6.7>> 6.7>> 6.3>> 6.5 > 6.6> 6.8 today Follow-up 4 months  Essential hypertension/obesity -Patient did not take her medications today however reporting still higher than normal at home.  Increase lisinopril to 30 mg daily. -Low-sodium diet -Continue routine exercise -Stress may be a factor Follow-up in 4 months with other chronic conditions.  Elevated alk phos/abnormal vitamin D/large gallstone: -GGT,  PTH and calcium were normal. -Bone density> osteopenia - vit d low> 1000 u d OTC and round of high dose prescribed. >  Vitamin D collected today for recheck. - Large gallstone present and has discussed with surgeon.   Stress/family dynamics: Patient is having a few family dynamic situations which are causing her stress.  We had a lengthy discussion today surrounding counseling and she is agreeable to referral today to help her with her situation.  Orders Placed This Encounter  Procedures  . Vitamin D (25 hydroxy)  . Ambulatory referral to Psychology  . POCT glycosylated hemoglobin (Hb A1C)   Meds ordered this encounter  Medications  . lisinopril (ZESTRIL) 30 MG tablet    Sig: Take 1 tablet (30 mg total) by mouth daily.    Dispense:  90 tablet    Refill:  1    DC prior scripts. Increase in dose.  . metFORMIN (GLUCOPHAGE) 500 MG tablet    Sig: Take 1 tablet (500 mg total) by mouth daily with breakfast.    Dispense:  90 tablet    Refill:  1    Note is dictated utilizing voice recognition software. Although note has been proof read prior to signing, occasional typographical errors still can be missed. If any questions arise, please do not hesitate to call for verification.  Electronically signed by: Howard Pouch, DO [place]

## 2019-08-21 NOTE — Telephone Encounter (Signed)
Her vitamin D levels are pretty much the same despite high-dose supplementation. Therefore, tell her to stop the over-the-counter vitamin D 1000 units daily. Instead,  we are going to have her take high-dose prescribed vitamin D 2 times a week for 12 weeks.  Separate the doses such as Wednesday and Sunday for example.  With a lab appointment only 1 week after her last supplement for recheck.  Orders placed.

## 2019-08-21 NOTE — Patient Instructions (Addendum)
Increase lisinopril to 30 mg a day. You can finish the bottle you have by taking 1.5 tabs of the 20 mg. New bottle will be 30 mg each.     Start metformin 500 mg a day.   I have referred you to Dr. Dewayne Hatch.   Follow up in 4 months. Make sure BP med is taken at least 2 hrs before appt.    Diabetes Mellitus and Nutrition, Adult When you have diabetes (diabetes mellitus), it is very important to have healthy eating habits because your blood sugar (glucose) levels are greatly affected by what you eat and drink. Eating healthy foods in the appropriate amounts, at about the same times every day, can help you:  Control your blood glucose.  Lower your risk of heart disease.  Improve your blood pressure.  Reach or maintain a healthy weight. Every person with diabetes is different, and each person has different needs for a meal plan. Your health care provider may recommend that you work with a diet and nutrition specialist (dietitian) to make a meal plan that is best for you. Your meal plan may vary depending on factors such as:  The calories you need.  The medicines you take.  Your weight.  Your blood glucose, blood pressure, and cholesterol levels.  Your activity level.  Other health conditions you have, such as heart or kidney disease. How do carbohydrates affect me? Carbohydrates, also called carbs, affect your blood glucose level more than any other type of food. Eating carbs naturally raises the amount of glucose in your blood. Carb counting is a method for keeping track of how many carbs you eat. Counting carbs is important to keep your blood glucose at a healthy level, especially if you use insulin or take certain oral diabetes medicines. It is important to know how many carbs you can safely have in each meal. This is different for every person. Your dietitian can help you calculate how many carbs you should have at each meal and for each snack. Foods that contain carbs  include:  Bread, cereal, rice, pasta, and crackers.  Potatoes and corn.  Peas, beans, and lentils.  Milk and yogurt.  Fruit and juice.  Desserts, such as cakes, cookies, ice cream, and candy. How does alcohol affect me? Alcohol can cause a sudden decrease in blood glucose (hypoglycemia), especially if you use insulin or take certain oral diabetes medicines. Hypoglycemia can be a life-threatening condition. Symptoms of hypoglycemia (sleepiness, dizziness, and confusion) are similar to symptoms of having too much alcohol. If your health care provider says that alcohol is safe for you, follow these guidelines:  Limit alcohol intake to no more than 1 drink per day for nonpregnant women and 2 drinks per day for men. One drink equals 12 oz of beer, 5 oz of wine, or 1 oz of hard liquor.  Do not drink on an empty stomach.  Keep yourself hydrated with water, diet soda, or unsweetened iced tea.  Keep in mind that regular soda, juice, and other mixers may contain a lot of sugar and must be counted as carbs. What are tips for following this plan?  Reading food labels  Start by checking the serving size on the "Nutrition Facts" label of packaged foods and drinks. The amount of calories, carbs, fats, and other nutrients listed on the label is based on one serving of the item. Many items contain more than one serving per package.  Check the total grams (g) of carbs in one serving.  You can calculate the number of servings of carbs in one serving by dividing the total carbs by 15. For example, if a food has 30 g of total carbs, it would be equal to 2 servings of carbs.  Check the number of grams (g) of saturated and trans fats in one serving. Choose foods that have low or no amount of these fats.  Check the number of milligrams (mg) of salt (sodium) in one serving. Most people should limit total sodium intake to less than 2,300 mg per day.  Always check the nutrition information of foods labeled  as "low-fat" or "nonfat". These foods may be higher in added sugar or refined carbs and should be avoided.  Talk to your dietitian to identify your daily goals for nutrients listed on the label. Shopping  Avoid buying canned, premade, or processed foods. These foods tend to be high in fat, sodium, and added sugar.  Shop around the outside edge of the grocery store. This includes fresh fruits and vegetables, bulk grains, fresh meats, and fresh dairy. Cooking  Use low-heat cooking methods, such as baking, instead of high-heat cooking methods like deep frying.  Cook using healthy oils, such as olive, canola, or sunflower oil.  Avoid cooking with butter, cream, or high-fat meats. Meal planning  Eat meals and snacks regularly, preferably at the same times every day. Avoid going long periods of time without eating.  Eat foods high in fiber, such as fresh fruits, vegetables, beans, and whole grains. Talk to your dietitian about how many servings of carbs you can eat at each meal.  Eat 4-6 ounces (oz) of lean protein each day, such as lean meat, chicken, fish, eggs, or tofu. One oz of lean protein is equal to: ? 1 oz of meat, chicken, or fish. ? 1 egg. ?  cup of tofu.  Eat some foods each day that contain healthy fats, such as avocado, nuts, seeds, and fish. Lifestyle  Check your blood glucose regularly.  Exercise regularly as told by your health care provider. This may include: ? 150 minutes of moderate-intensity or vigorous-intensity exercise each week. This could be brisk walking, biking, or water aerobics. ? Stretching and doing strength exercises, such as yoga or weightlifting, at least 2 times a week.  Take medicines as told by your health care provider.  Do not use any products that contain nicotine or tobacco, such as cigarettes and e-cigarettes. If you need help quitting, ask your health care provider.  Work with a Social worker or diabetes educator to identify strategies to  manage stress and any emotional and social challenges. Questions to ask a health care provider  Do I need to meet with a diabetes educator?  Do I need to meet with a dietitian?  What number can I call if I have questions?  When are the best times to check my blood glucose? Where to find more information:  American Diabetes Association: diabetes.org  Academy of Nutrition and Dietetics: www.eatright.[employer] of Diabetes and Digestive and Kidney Diseases (NIH): [employer] Summary  A healthy meal plan will help you control your blood glucose and maintain a healthy lifestyle.  Working with a diet and nutrition specialist (dietitian) can help you make a meal plan that is best for you.  Keep in mind that carbohydrates (carbs) and alcohol have immediate effects on your blood glucose levels. It is important to count carbs and to use alcohol carefully. This information is not intended to replace advice given to you  by your health care provider. Make sure you discuss any questions you have with your health care provider. Document Revised: 03/26/2017 Document Reviewed: 05/18/2016 Elsevier Patient Education  2020 [employer].

## 2019-08-22 ENCOUNTER — Telehealth: Payer: Self-pay

## 2019-08-22 NOTE — Telephone Encounter (Signed)
Pt called and said she felt nauseated and sick about 20 mins or so after eating breakfast and taking the Metformin. Pt is asking if she can cut in half or do something that is not going to make her feel sick.

## 2019-08-22 NOTE — Telephone Encounter (Signed)
Nausea can occur when starting metformin. If taken with a meal it can decrease side effects. That particular side effect typically improves after a few days of using medication.  Do not recommend cutting unless it is scored, which I do not believe they are at that low dose. Medications without a score line in the center should not be cut.  There is no other med in this class.

## 2019-08-22 NOTE — Telephone Encounter (Signed)
Pt was called and given information, she verbalized understanding  

## 2019-08-22 NOTE — Telephone Encounter (Signed)
Pt was called and given lab results, she verbalized understanding.  

## 2019-08-24 ENCOUNTER — Telehealth: Payer: Self-pay | Admitting: Family Medicine

## 2019-08-24 NOTE — Telephone Encounter (Signed)
Pharmacist was called and prior RX was filled by patient and pharmacist applied discount card due to costs.  Pharmacy was asked if discount card could be applied again, he did and RX is $11. Pt was called, verified she took the once weekly medication and completed it. She will pick up RX at pharmacy.

## 2019-08-24 NOTE — Telephone Encounter (Signed)
Pt went to pick up her rx for new Vit D and it was not covered by insurance. Did not get it due to cost and would like to know if she can double up on her OTC [employer] brand  instead? Is aware DR Claiborne Billings is out of office until tomorrow.

## 2019-08-24 NOTE — Telephone Encounter (Signed)
I looked this up on cover my meds to attempt prior auth but it was not a listed medication to be able to do one. Pt was called and made aware that this was 3 months worth of medications, she said she is aware and it was too expensive.   Please advise

## 2019-08-24 NOTE — Telephone Encounter (Signed)
Please ask patient if she took the high-dose vitamin D prescribed to her over the last 3 months?  She had reported she did.  However, her vitamin D did not change despite reportedly taking a high dose and now she is stating the medication is too expensive-but it is the same medicine just twice weekly.   Please explain to her if she did not take the high-dose vitamin D, then I need to know what dose of vitamin D she did take daily so that I can guide her on the proper vitamin D dose moving forward.  There are consequences to both low vitamin D and oversupplemented vitamin D, therefore I must have all the correct information in order to guide her safely. Thanks

## 2019-08-25 NOTE — Telephone Encounter (Signed)
Excellent work.  Thank you!

## 2019-11-06 ENCOUNTER — Other Ambulatory Visit: Payer: Self-pay

## 2019-11-06 ENCOUNTER — Ambulatory Visit (INDEPENDENT_AMBULATORY_CARE_PROVIDER_SITE_OTHER): Payer: PPO | Admitting: Family Medicine

## 2019-11-06 DIAGNOSIS — R748 Abnormal levels of other serum enzymes: Secondary | ICD-10-CM | POA: Diagnosis not present

## 2019-11-07 LAB — VITAMIN D 25 HYDROXY (VIT D DEFICIENCY, FRACTURES): Vit D, 25-Hydroxy: 22 ng/mL — ABNORMAL LOW (ref 30–100)

## 2019-11-07 LAB — ALKALINE PHOSPHATASE: Alkaline phosphatase (APISO): 116 U/L (ref 37–153)

## 2019-11-07 LAB — GAMMA GT: GGT: 15 U/L (ref 3–65)

## 2019-11-09 ENCOUNTER — Telehealth: Payer: Self-pay | Admitting: Family Medicine

## 2019-11-09 DIAGNOSIS — E7800 Pure hypercholesterolemia, unspecified: Secondary | ICD-10-CM | POA: Insufficient documentation

## 2019-11-09 DIAGNOSIS — F32A Depression, unspecified: Secondary | ICD-10-CM | POA: Insufficient documentation

## 2019-11-09 DIAGNOSIS — K219 Gastro-esophageal reflux disease without esophagitis: Secondary | ICD-10-CM | POA: Insufficient documentation

## 2019-11-09 HISTORY — DX: Gastro-esophageal reflux disease without esophagitis: K21.9

## 2019-11-09 NOTE — Telephone Encounter (Signed)
Pt was called and given lab results. She verbalized understanding.  Pt states her husband He was throwing away everything out of date and believes he threw it away by accident bc she cannot find it. She has only taken one dose of the high dose Vit D before it got missing. She would like to know if you want her to take that or just the OTC dose or try again with the high dose

## 2019-11-09 NOTE — Telephone Encounter (Signed)
Would encourage her to take over-the-counter supplementation vitamin D3 4000 units daily with food

## 2019-11-09 NOTE — Telephone Encounter (Signed)
Please call patient:  Alkaline phosphatase levels are normal. GGT  (liver) levels are still normal. Vitamin D levels are about the same still mildly insufficient at 22.  It would appear she does not absorb vitamin D very easily despite high-dose prescribed or over-the-counter daily formulation.  I would encourage her to continue supplementation with vitamin D3 4000 units daily with food-over-the-counter.

## 2019-11-10 NOTE — Telephone Encounter (Signed)
Pt was called and given information.  

## 2019-11-17 DIAGNOSIS — E559 Vitamin D deficiency, unspecified: Secondary | ICD-10-CM | POA: Diagnosis not present

## 2019-11-17 DIAGNOSIS — I10 Essential (primary) hypertension: Secondary | ICD-10-CM | POA: Diagnosis not present

## 2019-11-17 DIAGNOSIS — R0683 Snoring: Secondary | ICD-10-CM | POA: Diagnosis not present

## 2019-11-17 DIAGNOSIS — E119 Type 2 diabetes mellitus without complications: Secondary | ICD-10-CM | POA: Diagnosis not present

## 2019-11-17 DIAGNOSIS — M85852 Other specified disorders of bone density and structure, left thigh: Secondary | ICD-10-CM | POA: Insufficient documentation

## 2019-11-17 DIAGNOSIS — Z20822 Contact with and (suspected) exposure to covid-19: Secondary | ICD-10-CM | POA: Diagnosis not present

## 2019-12-18 ENCOUNTER — Ambulatory Visit: Payer: PPO | Admitting: Family Medicine

## 2020-01-05 DIAGNOSIS — H40013 Open angle with borderline findings, low risk, bilateral: Secondary | ICD-10-CM | POA: Diagnosis not present

## 2020-01-05 DIAGNOSIS — H2513 Age-related nuclear cataract, bilateral: Secondary | ICD-10-CM | POA: Diagnosis not present

## 2020-01-05 DIAGNOSIS — E119 Type 2 diabetes mellitus without complications: Secondary | ICD-10-CM | POA: Diagnosis not present

## 2020-01-16 ENCOUNTER — Ambulatory Visit: Payer: Self-pay | Admitting: General Surgery

## 2020-01-16 DIAGNOSIS — K8020 Calculus of gallbladder without cholecystitis without obstruction: Secondary | ICD-10-CM | POA: Diagnosis not present

## 2020-01-17 ENCOUNTER — Ambulatory Visit: Payer: Self-pay | Admitting: Clinical

## 2020-01-31 ENCOUNTER — Institutional Professional Consult (permissible substitution): Payer: PPO | Admitting: Pulmonary Disease

## 2020-02-05 DIAGNOSIS — Z1231 Encounter for screening mammogram for malignant neoplasm of breast: Secondary | ICD-10-CM | POA: Diagnosis not present

## 2020-02-20 DIAGNOSIS — Z23 Encounter for immunization: Secondary | ICD-10-CM | POA: Diagnosis not present

## 2020-02-23 DIAGNOSIS — F329 Major depressive disorder, single episode, unspecified: Secondary | ICD-10-CM | POA: Diagnosis not present

## 2020-02-23 DIAGNOSIS — Z0000 Encounter for general adult medical examination without abnormal findings: Secondary | ICD-10-CM | POA: Diagnosis not present

## 2020-02-23 DIAGNOSIS — E119 Type 2 diabetes mellitus without complications: Secondary | ICD-10-CM | POA: Diagnosis not present

## 2020-02-23 DIAGNOSIS — Z87891 Personal history of nicotine dependence: Secondary | ICD-10-CM | POA: Diagnosis not present

## 2020-02-23 DIAGNOSIS — E669 Obesity, unspecified: Secondary | ICD-10-CM | POA: Diagnosis not present

## 2020-02-23 DIAGNOSIS — Z23 Encounter for immunization: Secondary | ICD-10-CM | POA: Diagnosis not present

## 2020-02-23 DIAGNOSIS — M85852 Other specified disorders of bone density and structure, left thigh: Secondary | ICD-10-CM | POA: Diagnosis not present

## 2020-02-23 DIAGNOSIS — K8021 Calculus of gallbladder without cholecystitis with obstruction: Secondary | ICD-10-CM | POA: Diagnosis not present

## 2020-02-23 DIAGNOSIS — E559 Vitamin D deficiency, unspecified: Secondary | ICD-10-CM | POA: Diagnosis not present

## 2020-02-23 DIAGNOSIS — I10 Essential (primary) hypertension: Secondary | ICD-10-CM | POA: Diagnosis not present

## 2020-02-23 DIAGNOSIS — Z6830 Body mass index (BMI) 30.0-30.9, adult: Secondary | ICD-10-CM | POA: Diagnosis not present

## 2020-04-06 ENCOUNTER — Other Ambulatory Visit (HOSPITAL_COMMUNITY): Payer: PPO

## 2020-04-08 ENCOUNTER — Telehealth: Payer: Self-pay | Admitting: Family Medicine

## 2020-04-08 NOTE — Telephone Encounter (Signed)
Spoke with patient she stated she sees a provider at [facility]

## 2020-04-10 ENCOUNTER — Encounter (HOSPITAL_COMMUNITY): Admission: RE | Payer: Self-pay | Source: Home / Self Care

## 2020-04-10 ENCOUNTER — Ambulatory Visit (HOSPITAL_COMMUNITY): Admission: RE | Admit: 2020-04-10 | Payer: PPO | Source: Home / Self Care | Admitting: General Surgery

## 2020-04-10 SURGERY — LAPAROSCOPIC CHOLECYSTECTOMY WITH INTRAOPERATIVE CHOLANGIOGRAM
Anesthesia: General

## 2020-04-15 DIAGNOSIS — R0981 Nasal congestion: Secondary | ICD-10-CM | POA: Diagnosis not present

## 2020-04-15 DIAGNOSIS — Z20822 Contact with and (suspected) exposure to covid-19: Secondary | ICD-10-CM | POA: Diagnosis not present

## 2020-04-15 DIAGNOSIS — Z87891 Personal history of nicotine dependence: Secondary | ICD-10-CM | POA: Diagnosis not present

## 2020-04-15 DIAGNOSIS — R748 Abnormal levels of other serum enzymes: Secondary | ICD-10-CM | POA: Insufficient documentation

## 2020-04-15 DIAGNOSIS — J0100 Acute maxillary sinusitis, unspecified: Secondary | ICD-10-CM | POA: Diagnosis not present

## 2020-04-15 DIAGNOSIS — Z20828 Contact with and (suspected) exposure to other viral communicable diseases: Secondary | ICD-10-CM | POA: Diagnosis not present

## 2020-04-15 DIAGNOSIS — R059 Cough, unspecified: Secondary | ICD-10-CM | POA: Diagnosis not present

## 2020-05-24 DIAGNOSIS — I10 Essential (primary) hypertension: Secondary | ICD-10-CM | POA: Diagnosis not present

## 2020-05-24 DIAGNOSIS — Z8261 Family history of arthritis: Secondary | ICD-10-CM | POA: Diagnosis not present

## 2020-05-24 DIAGNOSIS — M85852 Other specified disorders of bone density and structure, left thigh: Secondary | ICD-10-CM | POA: Diagnosis not present

## 2020-05-24 DIAGNOSIS — E7800 Pure hypercholesterolemia, unspecified: Secondary | ICD-10-CM | POA: Diagnosis not present

## 2020-05-24 DIAGNOSIS — M2550 Pain in unspecified joint: Secondary | ICD-10-CM | POA: Diagnosis not present

## 2020-05-24 DIAGNOSIS — Z6831 Body mass index (BMI) 31.0-31.9, adult: Secondary | ICD-10-CM | POA: Diagnosis not present

## 2020-05-24 DIAGNOSIS — Z5181 Encounter for therapeutic drug level monitoring: Secondary | ICD-10-CM | POA: Diagnosis not present

## 2020-05-24 DIAGNOSIS — E119 Type 2 diabetes mellitus without complications: Secondary | ICD-10-CM | POA: Diagnosis not present

## 2020-07-23 ENCOUNTER — Other Ambulatory Visit: Payer: Self-pay | Admitting: Family Medicine

## 2020-07-23 DIAGNOSIS — E669 Obesity, unspecified: Secondary | ICD-10-CM | POA: Insufficient documentation

## 2020-07-23 DIAGNOSIS — Z1211 Encounter for screening for malignant neoplasm of colon: Secondary | ICD-10-CM | POA: Insufficient documentation

## 2020-07-23 DIAGNOSIS — I10 Essential (primary) hypertension: Secondary | ICD-10-CM

## 2020-07-23 DIAGNOSIS — R748 Abnormal levels of other serum enzymes: Secondary | ICD-10-CM | POA: Insufficient documentation

## 2020-07-25 ENCOUNTER — Ambulatory Visit (INDEPENDENT_AMBULATORY_CARE_PROVIDER_SITE_OTHER): Payer: PPO | Admitting: Family Medicine

## 2020-07-25 ENCOUNTER — Other Ambulatory Visit: Payer: Self-pay

## 2020-07-25 ENCOUNTER — Encounter: Payer: Self-pay | Admitting: Family Medicine

## 2020-07-25 VITALS — BP 142/81 | HR 66 | Temp 98.4°F | Ht 61.75 in | Wt 179.0 lb

## 2020-07-25 DIAGNOSIS — E538 Deficiency of other specified B group vitamins: Secondary | ICD-10-CM | POA: Diagnosis not present

## 2020-07-25 DIAGNOSIS — I10 Essential (primary) hypertension: Secondary | ICD-10-CM | POA: Diagnosis not present

## 2020-07-25 DIAGNOSIS — E669 Obesity, unspecified: Secondary | ICD-10-CM

## 2020-07-25 DIAGNOSIS — E559 Vitamin D deficiency, unspecified: Secondary | ICD-10-CM

## 2020-07-25 DIAGNOSIS — E119 Type 2 diabetes mellitus without complications: Secondary | ICD-10-CM

## 2020-07-25 DIAGNOSIS — E7800 Pure hypercholesterolemia, unspecified: Secondary | ICD-10-CM | POA: Diagnosis not present

## 2020-07-25 DIAGNOSIS — R011 Cardiac murmur, unspecified: Secondary | ICD-10-CM

## 2020-07-25 DIAGNOSIS — R002 Palpitations: Secondary | ICD-10-CM

## 2020-07-25 LAB — HEMOGLOBIN A1C: Hgb A1c MFr Bld: 7.7 % — ABNORMAL HIGH (ref 4.6–6.5)

## 2020-07-25 LAB — VITAMIN B12: Vitamin B-12: 301 pg/mL (ref 211–911)

## 2020-07-25 LAB — VITAMIN D 25 HYDROXY (VIT D DEFICIENCY, FRACTURES): VITD: 27.17 ng/mL — ABNORMAL LOW (ref 30.00–100.00)

## 2020-07-25 LAB — TSH: TSH: 0.85 u[IU]/mL (ref 0.35–4.50)

## 2020-07-25 MED ORDER — LISINOPRIL 40 MG PO TABS: 40.0000 mg | ORAL_TABLET | Freq: Every day | ORAL | 0 refills | Status: DC

## 2020-07-25 MED ORDER — METFORMIN HCL 500 MG PO TABS: 500.0000 mg | ORAL_TABLET | Freq: Every day | ORAL | 1 refills | Status: DC

## 2020-07-25 NOTE — Progress Notes (Signed)
This visit occurred during the SARS-CoV-2 public health emergency.  Safety protocols were in place, including screening questions prior to the visit, additional usage of staff PPE, and extensive cleaning of exam room while observing appropriate contact time as indicated for disinfecting solutions.    Patient ID: Maria Watkins, female  DOB: 08-16-1955, 65 y.o.   MRN: [id number] Patient Care Team    Relationship Specialty Notifications Start End  Ma Hillock, DO PCP - General Family Medicine  10/27/18   Juanita Craver, MD Consulting Physician Gastroenterology  01/18/18   Jerelyn Charles, MD Consulting Physician Obstetrics  01/18/18   Marica Otter, [place]  Optometry  11/10/18     Chief Complaint  Patient presents with  . Annual Exam    Pt is fasting;   . Establish Care    Pt est with a new PCP 10/2019 and wants to re-est with this provider    Subjective:  Maria Watkins is a 65 y.o.  Female  present for CMC/re-est. All past medical history, surgical history, allergies, family history, immunizations, medications and social history were updated in the electronic medical record today. All recent labs, ED visits and hospitalizations within the last year were reviewed.  Diet controlled diabetes: Patient had been on low-dose Metformin 500 mg daily and was tolerating.  However she has not been on this medication the last 6 months.  Her last A1c January 2022 was 7.3.  No medication was started at that time. Patient denies dizziness, hyperglycemic or hypoglycemic events. Patient denies numbness, tingling in the extremities or nonhealing wounds of feet.   SHe does not check her blood sugars at home.   PNA series: Pneumonia series completed Flu shot: 9/2021UTD(recommneded yearly) Foot exam: Completed today Eye exam: 01/2020 A1c: 6.7>> 6.7>> 6.3>> 6.5 > 6.6> 7.3> collected today  Essential hypertension/obesity/palpitations Pt reports compliance with lisinopril 30 mg daily.  Patient denies chest  pain, shortness of breath, dizziness or lower extremity edema.  She reports another provider started her on amlodipine 5 mg daily in addition to the lisinopril 30 mg daily, however she was fearful of starting the medication after reading the possible side effects. She has endorsed palpitations in the past, they did improve when she slowed down on the caffeine consumption.  She admits that they are still occurring at times. She was told she had a murmur on her last exam.  She states they had ordered an echocardiogram and a sleep study.  She has not had either of these completed as of yet.  She would like to discuss this today. Diet: Regular Exercise: Routine exercise RF: Hypertension, diabetes, obesity, former smoker, family history of heart disease  Seasonal allergies/seasonal allergies: Patient reports her allergies have been doing well on new regimen.  Regimen was changed to Zyrtec, Flonase, Astelin nasal spray and she reports much improved symptoms.  Did not discuss this appointment. Prior note: Pt presents for an OV with complaints ofnasal congestionof2 daysduration. Associated symptoms include waking up with congestion that gets better later in the day. Congestion when working in the yard.She states she did cough up a small amount of yellow phlegm today. Otherwise denies cough. She was treated for a sinus infection 09/17/2018 with amox x 10 days. She reports she has been taking claritin and Astelin nasal sprayas well. She endorses relief with her symptoms after completion of amoxicillin. At that time she was having some sinus pain and pressure which has resolved with antibiotics. He denies any fever, chills, nausea, vomit,  sinus pain or teeth pain.      Depression screen [facility] 2/9 07/25/2020 07/28/2018  Decreased Interest 0 0  Down, Depressed, Hopeless 0 0  PHQ - 2 Score 0 0   No flowsheet data found.   Immunization History  Administered Date(s) Administered  . Fluad Quad(high  Dose 65+) 01/16/2019  . Influenza Inj Mdck Quad With Preservative 12/27/2018  . Influenza, High Dose Seasonal PF 01/16/2019, 02/20/2020  . Influenza,inj,Quad PF,6+ Mos 01/18/2018  . Moderna Sars-Covid-2 Vaccination 06/08/2019, 07/07/2019  . Pneumococcal Conjugate-13 02/23/2020  . Pneumococcal Polysaccharide-23 04/14/2018  . Tdap 04/27/2013   Past Medical History:  Diagnosis Date  . Abnormal Pap smear of cervix 1980's   resolved post cryo with normal follow up  . Depression   . Elevated alkaline phosphatase level 05/15/2019  . Estrogen deficiency 05/15/2019  . Gallstones 08/04/2012  . GERD (gastroesophageal reflux disease) 11/09/2019  . Hypertension   . Lymphadenopathy 08/02/2019  . Type 2 diabetes mellitus without complication, without long-term current use of insulin ([place]) 01/18/2018   Allergies  Allergen Reactions  . Terconazole Nausea Only   Past Surgical History:  Procedure Laterality Date  . CERVIX LESION DESTRUCTION  1980s  . COLPOSCOPY  1980's   Family History  Problem Relation Age of Onset  . Diabetes Mother   . Arthritis Mother   . COPD Father   . Asthma Father   . Heart disease Father   . Diabetes Sister   . Asthma Brother   . COPD Sister   . Rheum arthritis Sister   . Pulmonary fibrosis Sister   . Breast cancer Maternal Aunt   . Alcohol abuse Son    Social History   Social History Narrative  . Not on file    Allergies as of 07/25/2020      Reactions   Terconazole Nausea Only      Medication List       Accurate as of July 25, 2020  1:39 PM. If you have any questions, ask your nurse or doctor.        STOP taking these medications   amLODipine 5 MG tablet Commonly known as: NORVASC Stopped by: Howard Pouch, DO   Vitamin D (Ergocalciferol) 1.25 MG (50000 UNIT) Caps capsule Commonly known as: DRISDOL Stopped by: Howard Pouch, DO     TAKE these medications   azelastine 0.1 % nasal spray Commonly known as: ASTELIN Place 2 sprays into both  nostrils 2 (two) times daily. Use in each nostril as directed   cholecalciferol 25 MCG (1000 UNIT) tablet Commonly known as: VITAMIN D3 Take 1,000 Units by mouth daily.   fluticasone 50 MCG/ACT nasal spray Commonly known as: FLONASE Place 2 sprays into both nostrils daily.   lisinopril 40 MG tablet Commonly known as: ZESTRIL Take 1 tablet (40 mg total) by mouth daily. What changed:   medication strength  how much to take Changed by: Howard Pouch, DO   loratadine 10 MG tablet Commonly known as: CLARITIN Take 10 mg by mouth daily.   metFORMIN 500 MG tablet Commonly known as: GLUCOPHAGE Take 1 tablet (500 mg total) by mouth daily with breakfast.   vitamin B-12 1000 MCG tablet Commonly known as: CYANOCOBALAMIN Take 1,000 mcg by mouth daily.       All past medical history, surgical history, allergies, family history, immunizations andmedications were updated in the EMR today and reviewed under the history and medication portions of their EMR.     No results found for this or  any previous visit (from the past 2160 hour(s)).   ROS: 14 pt review of systems performed and negative (unless mentioned in an HPI)  Objective: BP (!) 142/81   Pulse 66   Temp 98.4 F (36.9 C) (Oral)   Ht 5' 1.75" (1.568 m)   Wt 179 lb (81.2 kg)   LMP 04/27/2005 (Approximate)   SpO2 98%   BMI 33.01 kg/m  Gen: Afebrile. No acute distress. Nontoxic in appearance, well-developed, well-nourished, very Maria, overweight female HENT: AT. [place].  No cough on exam, no hoarseness on exam. Eyes:Pupils Equal Round Reactive to light, Extraocular movements intact,  Conjunctiva without redness, discharge or icterus. Neck/lymp/endocrine: Supple, no lymphadenopathy, no thyromegaly CV: RRR 1/6 systolic murmur best heard at right sternal border, no edema, +2/4 P posterior tibialis pulses.  Chest: CTAB, no wheeze, rhonchi or crackles.  Normal respiratory effort.  Good air movement. Abd: Soft.  Obese. NTND. BS  present.  hepatosplenomegaly. No rebound tenderness or guarding. Skin: . Warm and well-perfused. Skin intact. Neuro/Msk:  Normal gait. PERLA. EOMi. Alert. Oriented x3. Psych: Normal affect, dress and demeanor. Normal speech. Normal thought content and judgment.   No exam data present  Assessment/plan: RABECCA BIRGE is a 65 y.o. female present for CPE - re-establishment.  Type 2 diabetes mellitus -diet controlled -Patient had been tolerating Metformin low-dose, but it has been discontinued over the last 6 months. She had an A1c of 7.3 in January, however no medications were restarted for her. Restart Metformin 500 mg daily -She was encouraged to focus on her diet and exercise and have a close  PNA series: Pneumonia series completed Flu shot: Up-to-date (recommneded yearly) Foot exam: Completed today Eye exam: Today 01/2020 A1c: 6.7>> 6.7>> 6.3>> 6.5> 6.6> 7.3> A1c today Follow-up 3 months   Essential hypertension/obesity/palpitations/murmur -Blood pressure is still above goal. -Increase lisinopril to 40 mg daily.  . - TSH, T3, T4 collected today Follow-up 3 months  B12 deficiency: Reviewed outside labs with a B12 of 138.  Patient has started to 1000 mcg of sublingual B12 greater than 3 months ago. B12 levels collected today.  If B12 levels are adequately supplemented with sublingual version, will offer B12 injections.  Seasonal allergic rhinitis due to pollen Allergy symptoms are much better controlled now that she is on Zyrtec, Flonase and Astelin.  Continue current regimen through the summer recommended.  Elevated alk phos/abnormal vitamin D: -GGT, PTH and calcium were normal. -Bone density completed with osteopenia result -Vitamin D levels collected today.  Patient is on supplementation. -Noted on outside labs that her alk phos is now in normal range.  Health maintenance: Patient was encouraged to consider Covid booster Patient declined Shingrix today He has had a  bone density and mammogram completed in October 2021 through [facility], records will be requested  Return in about 3 months (around 10/24/2020) for [place] (30 min).   Orders Placed This Encounter  Procedures  . Hemoglobin A1c  . TSH  . VITAMIN D 25 Hydroxy (Vit-D Deficiency, Fractures)  . B12  . ECHOCARDIOGRAM COMPLETE   Meds ordered this encounter  Medications  . metFORMIN (GLUCOPHAGE) 500 MG tablet    Sig: Take 1 tablet (500 mg total) by mouth daily with breakfast.    Dispense:  90 tablet    Refill:  1  . lisinopril (ZESTRIL) 40 MG tablet    Sig: Take 1 tablet (40 mg total) by mouth daily.    Dispense:  90 tablet    Refill:  0  DC prior scripts. Increase in dose.   Referral Orders  No referral(s) requested today    > 50 minutes was dedicated to this patient's encounter to include pre-visit review of chart, face-to-face time with patient and post-visit work- which include documentation and prescribing medications and/or ordering test when necessary-as well as reestablishing patient and reviewing EMR records from other providers visits.    Electronically signed by: Howard Pouch, DO [place]

## 2020-07-25 NOTE — Patient Instructions (Signed)
Very nice to see you today.   We will call you with lab results.  Next appt in 3 months.   Restart metformin once daily  Increased lisinopril to 40 mg a day.    Try to increase your exercise to help with her diabetes.  Avoid sugary meals.     Diabetes Mellitus and Nutrition, Adult When you have diabetes, or diabetes mellitus, it is very important to have healthy eating habits because your blood sugar (glucose) levels are greatly affected by what you eat and drink. Eating healthy foods in the right amounts, at about the same times every day, can help you:  Control your blood glucose.  Lower your risk of heart disease.  Improve your blood pressure.  Reach or maintain a healthy weight. What can affect my meal plan? Every person with diabetes is different, and each person has different needs for a meal plan. Your health care provider may recommend that you work with a dietitian to make a meal plan that is best for you. Your meal plan may vary depending on factors such as:  The calories you need.  The medicines you take.  Your weight.  Your blood glucose, blood pressure, and cholesterol levels.  Your activity level.  Other health conditions you have, such as heart or kidney disease. How do carbohydrates affect me? Carbohydrates, also called carbs, affect your blood glucose level more than any other type of food. Eating carbs naturally raises the amount of glucose in your blood. Carb counting is a method for keeping track of how many carbs you eat. Counting carbs is important to keep your blood glucose at a healthy level, especially if you use insulin or take certain oral diabetes medicines. It is important to know how many carbs you can safely have in each meal. This is different for every person. Your dietitian can help you calculate how many carbs you should have at each meal and for each snack. How does alcohol affect me? Alcohol can cause a sudden decrease in blood glucose  (hypoglycemia), especially if you use insulin or take certain oral diabetes medicines. Hypoglycemia can be a life-threatening condition. Symptoms of hypoglycemia, such as sleepiness, dizziness, and confusion, are similar to symptoms of having too much alcohol.  Do not drink alcohol if: ? Your health care provider tells you not to drink. ? You are pregnant, may be pregnant, or are planning to become pregnant.  If you drink alcohol: ? Do not drink on an empty stomach. ? Limit how much you use to:  0-1 drink a day for women.  0-2 drinks a day for men. ? Be aware of how much alcohol is in your drink. In the [place] one drink equals one 12 oz bottle of beer (355 mL), one 5 oz glass of wine (148 mL), or one 1 oz glass of hard liquor (44 mL). ? Keep yourself hydrated with water, diet soda, or unsweetened iced tea.  Keep in mind that regular soda, juice, and other mixers may contain a lot of sugar and must be counted as carbs. What are tips for following this plan? Reading food labels  Start by checking the serving size on the "Nutrition Facts" label of packaged foods and drinks. The amount of calories, carbs, fats, and other nutrients listed on the label is based on one serving of the item. Many items contain more than one serving per package.  Check the total grams (g) of carbs in one serving. You can calculate the number  of servings of carbs in one serving by dividing the total carbs by 15. For example, if a food has 30 g of total carbs per serving, it would be equal to 2 servings of carbs.  Check the number of grams (g) of saturated fats and trans fats in one serving. Choose foods that have a low amount or none of these fats.  Check the number of milligrams (mg) of salt (sodium) in one serving. Most people should limit total sodium intake to less than 2,300 mg per day.  Always check the nutrition information of foods labeled as "low-fat" or "nonfat." These foods may be higher in added sugar or  refined carbs and should be avoided.  Talk to your dietitian to identify your daily goals for nutrients listed on the label. Shopping  Avoid buying canned, pre-made, or processed foods. These foods tend to be high in fat, sodium, and added sugar.  Shop around the outside edge of the grocery store. This is where you will most often find fresh fruits and vegetables, bulk grains, fresh meats, and fresh dairy. Cooking  Use low-heat cooking methods, such as baking, instead of high-heat cooking methods like deep frying.  Cook using healthy oils, such as olive, canola, or sunflower oil.  Avoid cooking with butter, cream, or high-fat meats. Meal planning  Eat meals and snacks regularly, preferably at the same times every day. Avoid going long periods of time without eating.  Eat foods that are high in fiber, such as fresh fruits, vegetables, beans, and whole grains. Talk with your dietitian about how many servings of carbs you can eat at each meal.  Eat 4-6 oz (112-168 g) of lean protein each day, such as lean meat, chicken, fish, eggs, or tofu. One ounce (oz) of lean protein is equal to: ? 1 oz (28 g) of meat, chicken, or fish. ? 1 egg. ?  cup (62 g) of tofu.  Eat some foods each day that contain healthy fats, such as avocado, nuts, seeds, and fish.   What foods should I eat? Fruits Berries. Apples. Oranges. Peaches. Apricots. Plums. Grapes. Mango. Papaya. Pomegranate. Kiwi. Cherries. Vegetables Lettuce. Spinach. Leafy greens, including kale, chard, collard greens, and mustard greens. Beets. Cauliflower. Cabbage. Broccoli. Carrots. Green beans. Tomatoes. Peppers. Onions. Cucumbers. Brussels sprouts. Grains Whole grains, such as whole-wheat or whole-grain bread, crackers, tortillas, cereal, and pasta. Unsweetened oatmeal. Quinoa. Brown or wild rice. Meats and other proteins Seafood. Poultry without skin. Lean cuts of poultry and beef. Tofu. Nuts. Seeds. Dairy Low-fat or fat-free dairy  products such as milk, yogurt, and cheese. The items listed above may not be a complete list of foods and beverages you can eat. Contact a dietitian for more information. What foods should I avoid? Fruits Fruits canned with syrup. Vegetables Canned vegetables. Frozen vegetables with butter or cream sauce. Grains Refined white flour and flour products such as bread, pasta, snack foods, and cereals. Avoid all processed foods. Meats and other proteins Fatty cuts of meat. Poultry with skin. Breaded or fried meats. Processed meat. Avoid saturated fats. Dairy Full-fat yogurt, cheese, or milk. Beverages Sweetened drinks, such as soda or iced tea. The items listed above may not be a complete list of foods and beverages you should avoid. Contact a dietitian for more information. Questions to ask a health care provider  Do I need to meet with a diabetes educator?  Do I need to meet with a dietitian?  What number can I call if I have questions?  When are the best times to check my blood glucose? Where to find more information:  American Diabetes Association: diabetes.org  Academy of Nutrition and Dietetics: www.eatright.[employer] of Diabetes and Digestive and Kidney Diseases: CarFlippers.tn  Association of Diabetes Care and Education Specialists: www.diabeteseducator.org Summary  It is important to have healthy eating habits because your blood sugar (glucose) levels are greatly affected by what you eat and drink.  A healthy meal plan will help you control your blood glucose and maintain a healthy lifestyle.  Your health care provider may recommend that you work with a dietitian to make a meal plan that is best for you.  Keep in mind that carbohydrates (carbs) and alcohol have immediate effects on your blood glucose levels. It is important to count carbs and to use alcohol carefully. This information is not intended to replace advice given to you by your health care  provider. Make sure you discuss any questions you have with your health care provider. Document Revised: 03/21/2019 Document Reviewed: 03/21/2019 Elsevier Patient Education  2021 [employer].

## 2020-07-26 ENCOUNTER — Telehealth: Payer: Self-pay | Admitting: Family Medicine

## 2020-07-26 DIAGNOSIS — E119 Type 2 diabetes mellitus without complications: Secondary | ICD-10-CM

## 2020-07-26 MED ORDER — METFORMIN HCL 500 MG PO TABS: 500.0000 mg | ORAL_TABLET | Freq: Two times a day (BID) | ORAL | 1 refills | Status: DC

## 2020-07-26 NOTE — Telephone Encounter (Signed)
LVM for pt to CB regarding results.  

## 2020-07-26 NOTE — Telephone Encounter (Signed)
Spoke with patient regarding results/recommendations.  

## 2020-07-26 NOTE — Telephone Encounter (Signed)
Please call patient: -Her thyroid function is normal. - Her vitamin D is 27, continue vitamin D supplementation daily.  Please verify her vitamin D dose with her.  If she is taking 4000 units daily or less, please advise her to add 1000 units to her current dose of vitamin D supplementation.  Ideally would like to see her vitamin D levels above 30. - Her B12 has improved and up to 301.  Although this is technically in the normal range for B12, we typically look for levels above 500 to be more therapeutic.  Her options are to increase sublingual dose of B12 daily or consider adding B12 injections once monthly to her regimen (meaning continue sublingual as well).  If she would like to start injections once monthly please set her up for B12 injections every 4 weeks x12 - appointment.  Lastly, her A1c has increased to 7.7.  We discussed restarting Metformin yesterday at 1 tab in the morning with breakfast.  With an A1c of 7.7 she is going to need more coverage than that, therefore I called in a new prescription for Metformin 1 tab twice daily with meals.  If she is already picked up the medication from yesterday she can use that bottle by just taking 1 tab twice daily she will just run out sooner and when she picks up her new bottle will have more pills in the bottle.   -Avoid high sugar content meals or high carbohydrate loaded meals.  We can counsel her on nutrition/diabetic diet at her follow-up appointment in 3 months.

## 2020-08-01 ENCOUNTER — Telehealth: Payer: Self-pay | Admitting: Family Medicine

## 2020-08-01 NOTE — Telephone Encounter (Signed)
Received message from scheduling facility: Please enter new order for echocardiogram with CPT 93306, [id number], [phone]. The order will expire before they can get the patient scheduled.

## 2020-08-02 NOTE — Telephone Encounter (Signed)
Dr. Claiborne Billings ordered this on 07/25/20.

## 2020-08-02 NOTE — Telephone Encounter (Signed)
Please help assist with CPT

## 2020-08-02 NOTE — Telephone Encounter (Signed)
According to Maria Watkins we are not the ordering provider. I can't see who called so I can't inform them to contact ordering provider.

## 2020-08-14 ENCOUNTER — Telehealth: Payer: Self-pay | Admitting: Family Medicine

## 2020-08-14 DIAGNOSIS — R011 Cardiac murmur, unspecified: Secondary | ICD-10-CM

## 2020-08-14 DIAGNOSIS — R002 Palpitations: Secondary | ICD-10-CM

## 2020-08-14 NOTE — Telephone Encounter (Signed)
Per Thea Alken: Good Morning. I have scheduled the patients echo but the order will not let me link it to the appt due to something with the order. Will you please have the RN or MD to order new orer for echocardiogram and I will link .  :(  Please enter new order and advise when completed. Thank you.

## 2020-08-15 NOTE — Telephone Encounter (Signed)
New order entered, thank you!

## 2020-09-04 ENCOUNTER — Encounter: Payer: Self-pay | Admitting: Family Medicine

## 2020-09-04 ENCOUNTER — Ambulatory Visit (HOSPITAL_COMMUNITY): Payer: PPO | Attending: Cardiology

## 2020-09-04 ENCOUNTER — Other Ambulatory Visit: Payer: Self-pay

## 2020-09-04 DIAGNOSIS — R002 Palpitations: Secondary | ICD-10-CM | POA: Diagnosis not present

## 2020-09-04 DIAGNOSIS — R011 Cardiac murmur, unspecified: Secondary | ICD-10-CM | POA: Diagnosis not present

## 2020-09-04 LAB — ECHOCARDIOGRAM COMPLETE
Area-P 1/2: 3.2 cm2
S' Lateral: 2.2 cm

## 2020-09-05 ENCOUNTER — Telehealth: Payer: Self-pay | Admitting: Family Medicine

## 2020-09-05 DIAGNOSIS — R011 Cardiac murmur, unspecified: Secondary | ICD-10-CM | POA: Insufficient documentation

## 2020-09-05 DIAGNOSIS — I071 Rheumatic tricuspid insufficiency: Secondary | ICD-10-CM | POA: Insufficient documentation

## 2020-09-05 DIAGNOSIS — I5189 Other ill-defined heart diseases: Secondary | ICD-10-CM

## 2020-09-05 DIAGNOSIS — I340 Nonrheumatic mitral (valve) insufficiency: Secondary | ICD-10-CM | POA: Insufficient documentation

## 2020-09-05 DIAGNOSIS — I517 Cardiomegaly: Secondary | ICD-10-CM | POA: Insufficient documentation

## 2020-09-05 DIAGNOSIS — I10 Essential (primary) hypertension: Secondary | ICD-10-CM

## 2020-09-05 DIAGNOSIS — I7789 Other specified disorders of arteries and arterioles: Secondary | ICD-10-CM | POA: Insufficient documentation

## 2020-09-05 NOTE — Telephone Encounter (Signed)
Patient was called and results were discussed with patient and her husband today. Have received the results of her echocardiogram. - The pumping action of her heart is normal.  And looks really good. -The relaxation of her heart is slightly impaired, but overall good.  This can cause fluid/edema in the lower extremities at times. -She has mild mitral valve regurgitation and moderate tricuspid valve regurgitation.  This means the valves between the chambers of the heart can be a little leaky, meaning they do not close fully between relaxation and pumping action of the heart.  This is what can cause a murmur sound to be appreciated.   - there is mild atrial enlargement left> BP control and fluid control.  -There is also a mild enlargement of the aorta at 39 mm  Adequate BP management. Cardio referral.

## 2020-10-24 ENCOUNTER — Telehealth: Payer: Self-pay | Admitting: Family Medicine

## 2020-10-24 ENCOUNTER — Ambulatory Visit (INDEPENDENT_AMBULATORY_CARE_PROVIDER_SITE_OTHER): Payer: PPO | Admitting: Family Medicine

## 2020-10-24 ENCOUNTER — Other Ambulatory Visit: Payer: Self-pay

## 2020-10-24 ENCOUNTER — Encounter: Payer: Self-pay | Admitting: Family Medicine

## 2020-10-24 VITALS — BP 149/83 | HR 63 | Temp 98.1°F | Resp 16 | Ht 61.75 in | Wt 178.8 lb

## 2020-10-24 DIAGNOSIS — I10 Essential (primary) hypertension: Secondary | ICD-10-CM | POA: Diagnosis not present

## 2020-10-24 DIAGNOSIS — I071 Rheumatic tricuspid insufficiency: Secondary | ICD-10-CM

## 2020-10-24 DIAGNOSIS — J301 Allergic rhinitis due to pollen: Secondary | ICD-10-CM | POA: Diagnosis not present

## 2020-10-24 DIAGNOSIS — I517 Cardiomegaly: Secondary | ICD-10-CM

## 2020-10-24 DIAGNOSIS — E118 Type 2 diabetes mellitus with unspecified complications: Secondary | ICD-10-CM

## 2020-10-24 DIAGNOSIS — I340 Nonrheumatic mitral (valve) insufficiency: Secondary | ICD-10-CM

## 2020-10-24 DIAGNOSIS — E119 Type 2 diabetes mellitus without complications: Secondary | ICD-10-CM | POA: Diagnosis not present

## 2020-10-24 DIAGNOSIS — I7789 Other specified disorders of arteries and arterioles: Secondary | ICD-10-CM

## 2020-10-24 DIAGNOSIS — E7800 Pure hypercholesterolemia, unspecified: Secondary | ICD-10-CM | POA: Diagnosis not present

## 2020-10-24 DIAGNOSIS — I5189 Other ill-defined heart diseases: Secondary | ICD-10-CM | POA: Diagnosis not present

## 2020-10-24 DIAGNOSIS — R002 Palpitations: Secondary | ICD-10-CM

## 2020-10-24 LAB — POCT GLYCOSYLATED HEMOGLOBIN (HGB A1C)
HbA1c POC (<> result, manual entry): 6.9 % (ref 4.0–5.6)
HbA1c, POC (controlled diabetic range): 6.9 % (ref 0.0–7.0)
HbA1c, POC (prediabetic range): 6.9 % — AB (ref 5.7–6.4)
Hemoglobin A1C: 6.9 % — AB (ref 4.0–5.6)

## 2020-10-24 MED ORDER — METFORMIN HCL 1000 MG PO TABS
1000.0000 mg | ORAL_TABLET | Freq: Every day | ORAL | 1 refills | Status: DC
Start: 2020-10-24 — End: 2021-04-22

## 2020-10-24 MED ORDER — HYDROCHLOROTHIAZIDE 25 MG PO TABS: 25.0000 mg | ORAL_TABLET | Freq: Every day | ORAL | 1 refills | Status: DC

## 2020-10-24 MED ORDER — LISINOPRIL 40 MG PO TABS: 40.0000 mg | ORAL_TABLET | Freq: Every day | ORAL | 1 refills | Status: DC

## 2020-10-24 NOTE — Patient Instructions (Addendum)
Your a1c is better at 6.9.> metformin 1000 mg  BP goal <130/80. Continue lisinopril and start hctz daily.    Hypertension, Adult Hypertension is another name for high blood pressure. High blood pressure forces your heart to work harder to pump blood. This can cause problems overtime. There are two numbers in a blood pressure reading. There is a top number (systolic) over a bottom number (diastolic). It is best to have a blood pressure that is below 120/80. Healthy choicescan help lower your blood pressure, or you may need medicine to help lower it. What are the causes? The cause of this condition is not known. Some conditions may be related tohigh blood pressure. What increases the risk? Smoking. Having type 2 diabetes mellitus, high cholesterol, or both. Not getting enough exercise or physical activity. Being overweight. Having too much fat, sugar, calories, or salt (sodium) in your diet. Drinking too much alcohol. Having long-term (chronic) kidney disease. Having a family history of high blood pressure. Age. Risk increases with age. Race. You may be at higher risk if you are African American. Gender. Men are at higher risk than women before age 64. After age 31, women are at higher risk than men. Having obstructive sleep apnea. Stress. What are the signs or symptoms? High blood pressure may not cause symptoms. Very high blood pressure (hypertensive crisis) may cause: Headache. Feelings of worry or nervousness (anxiety). Shortness of breath. Nosebleed. A feeling of being sick to your stomach (nausea). Throwing up (vomiting). Changes in how you see. Very bad chest pain. Seizures. How is this treated? This condition is treated by making healthy lifestyle changes, such as: Eating healthy foods. Exercising more. Drinking less alcohol. Your health care provider may prescribe medicine if lifestyle changes are not enough to get your blood pressure under control, and if: Your top  number is above 130. Your bottom number is above 80. Your personal target blood pressure may vary. Follow these instructions at home: Eating and drinking  If told, follow the DASH eating plan. To follow this plan: Fill one half of your plate at each meal with fruits and vegetables. Fill one fourth of your plate at each meal with whole grains. Whole grains include whole-wheat pasta, brown rice, and whole-grain bread. Eat or drink low-fat dairy products, such as skim milk or low-fat yogurt. Fill one fourth of your plate at each meal with low-fat (lean) proteins. Low-fat proteins include fish, chicken without skin, eggs, beans, and tofu. Avoid fatty meat, cured and processed meat, or chicken with skin. Avoid pre-made or processed food. Eat less than 1,500 mg of salt each day. Do not drink alcohol if: Your doctor tells you not to drink. You are pregnant, may be pregnant, or are planning to become pregnant. If you drink alcohol: Limit how much you use to: 0-1 drink a day for women. 0-2 drinks a day for men. Be aware of how much alcohol is in your drink. In the [place] one drink equals one 12 oz bottle of beer (355 mL), one 5 oz glass of wine (148 mL), or one 1 oz glass of hard liquor (44 mL).  Lifestyle  Work with your doctor to stay at a healthy weight or to lose weight. Ask your doctor what the best weight is for you. Get at least 30 minutes of exercise most days of the week. This may include walking, swimming, or biking. Get at least 30 minutes of exercise that strengthens your muscles (resistance exercise) at least 3 days a  week. This may include lifting weights or doing Pilates. Do not use any products that contain nicotine or tobacco, such as cigarettes, e-cigarettes, and chewing tobacco. If you need help quitting, ask your doctor. Check your blood pressure at home as told by your doctor. Keep all follow-up visits as told by your doctor. This is important.  Medicines Take  over-the-counter and prescription medicines only as told by your doctor. Follow directions carefully. Do not skip doses of blood pressure medicine. The medicine does not work as well if you skip doses. Skipping doses also puts you at risk for problems. Ask your doctor about side effects or reactions to medicines that you should watch for. Contact a doctor if you: Think you are having a reaction to the medicine you are taking. Have headaches that keep coming back (recurring). Feel dizzy. Have swelling in your ankles. Have trouble with your vision. Get help right away if you: Get a very bad headache. Start to feel mixed up (confused). Feel weak or numb. Feel faint. Have very bad pain in your: Chest. Belly (abdomen). Throw up more than once. Have trouble breathing. Summary Hypertension is another name for high blood pressure. High blood pressure forces your heart to work harder to pump blood. For most people, a normal blood pressure is less than 120/80. Making healthy choices can help lower blood pressure. If your blood pressure does not get lower with healthy choices, you may need to take medicine. This information is not intended to replace advice given to you by your health care provider. Make sure you discuss any questions you have with your healthcare provider. Document Revised: 12/22/2017 Document Reviewed: 12/22/2017 Elsevier Patient Education  2022 [employer].

## 2020-10-24 NOTE — Telephone Encounter (Signed)
Pt was seen today(10/24/20) and has several questions about her prescriptions. She wants to make sure, she is on the right page. Please advise.

## 2020-10-24 NOTE — Progress Notes (Signed)
This visit occurred during the SARS-CoV-2 public health emergency.  Safety protocols were in place, including screening questions prior to the visit, additional usage of staff PPE, and extensive cleaning of exam room while observing appropriate contact time as indicated for disinfecting solutions.    Patient ID: Maria Watkins, female  DOB: 08/23/1955, 65 y.o.   MRN: [id number] Patient Care Team    Relationship Specialty Notifications Start End  Ma Hillock, DO PCP - General Family Medicine  10/27/18   Juanita Craver, MD Consulting Physician Gastroenterology  01/18/18   Jerelyn Charles, MD Consulting Physician Obstetrics  01/18/18   Marica Otter, [place]  Optometry  11/10/18     Chief Complaint  Patient presents with   [facility]    Pt is fasting   Diabetes    Subjective: SADIRA STANDARD is a 65 y.o.  Female  present for [facility] diabetes: Patient reports compliance with metformin 500 mg qd. Patient denies dizziness, hyperglycemic or hypoglycemic events. Patient denies numbness, tingling in the extremities or nonhealing wounds of feet.  SHe does not check her blood sugars at home.   PNA series: Pneumonia series completed Flu shot: 9/2021UTD(recommneded yearly) Foot exam: Completed  Eye exam: 01/2020 A1c: 6.7>> 6.7>> 6.3>> 6.5 > 6.6> 7.3>7.7> 6.9 collected today   Essential hypertension/morbid obesity/palpitations/HLD/valve regurg/enlarged aorta Pt reports compliance with lisinopril 40 mg daily.  Patient denies chest pain, shortness of breath, dizziness or lower extremity edema.  She has endorsed palpitations in the past, they did improve when she slowed down on the caffeine consumption.  She admits that they are still occurring at times. She was told she had a murmur on her last exam.  She states they had ordered an echocardiogram and a sleep study.  She has not had either of these completed as of yet.  She would like to discuss this today. Diet: Regular Exercise: Routine exercise RF:  Hypertension, diabetes, obesity, former smoker, family history of heart disease   Seasonal allergies/seasonal allergies: Patient reports her allergies have been doing well on new regimen.  Regimen was changed to Zyrtec, Flonase, Astelin nasal spray and she reports much improved symptoms.  Did not discuss this appointment. Prior note: Pt presents for an OV with complaints of nasal congestion of 2 days duration.  Associated symptoms include waking up with congestion that gets better later in the day. Congestion when working in the yard.  She states she did cough up a small amount of yellow phlegm today.  Otherwise denies cough. She was treated for a sinus infection 09/17/2018 with amox x 10 days. She reports she has been taking claritin and Astelin nasal spray as well.  She endorses relief with her symptoms after completion of amoxicillin.  At that time she was having some sinus pain and pressure which has resolved with antibiotics.  He denies any fever, chills, nausea, vomit, sinus pain or teeth pain.     Depression screen [facility] 2/9 07/25/2020 07/28/2018  Decreased Interest 0 0  Down, Depressed, Hopeless 0 0  PHQ - 2 Score 0 0   No flowsheet data found.   Immunization History  Administered Date(s) Administered   Fluad Quad(high Dose 65+) 01/16/2019   Influenza Inj Mdck Quad With Preservative 12/27/2018   Influenza, High Dose Seasonal PF 01/16/2019, 02/20/2020   Influenza,inj,Quad PF,6+ Mos 01/18/2018   Moderna Sars-Covid-2 Vaccination 06/08/2019, 07/07/2019   Pneumococcal Conjugate-13 02/23/2020   Pneumococcal Polysaccharide-23 04/14/2018   Tdap 04/27/2013   Past Medical History:  Diagnosis Date  Abnormal Pap smear of cervix 1980's   resolved post cryo with normal follow up   Depression    Elevated alkaline phosphatase level 05/15/2019   Estrogen deficiency 05/15/2019   Gallstones 08/04/2012   GERD (gastroesophageal reflux disease) 11/09/2019   Hypertension    Lymphadenopathy 08/02/2019    Type 2 diabetes mellitus without complication, without long-term current use of insulin ([place]) 01/18/2018   Allergies  Allergen Reactions   Terconazole Nausea Only   Past Surgical History:  Procedure Laterality Date   CERVIX LESION DESTRUCTION  1980s   COLPOSCOPY  75's   Family History  Problem Relation Age of Onset   Diabetes Mother    Arthritis Mother    COPD Father    Asthma Father    Heart disease Father    Diabetes Sister    Asthma Brother    COPD Sister    Rheum arthritis Sister    Pulmonary fibrosis Sister        Passed away 21-Apr-2020   Breast cancer Maternal Aunt    Alcohol abuse Son    Social History   Social History Narrative   Not on file    Allergies as of 10/24/2020       Reactions   Terconazole Nausea Only        Medication List        Accurate as of October 24, 2020  9:40 AM. If you have any questions, ask your nurse or doctor.          azelastine 0.1 % nasal spray Commonly known as: ASTELIN Place 2 sprays into both nostrils 2 (two) times daily. Use in each nostril as directed   cholecalciferol 25 MCG (1000 UNIT) tablet Commonly known as: VITAMIN D3 Take 1,000 Units by mouth daily.   fluticasone 50 MCG/ACT nasal spray Commonly known as: FLONASE Place 2 sprays into both nostrils daily.   hydrochlorothiazide 25 MG tablet Commonly known as: HYDRODIURIL Take 1 tablet (25 mg total) by mouth daily. Started by: Howard Pouch, DO   lisinopril 40 MG tablet Commonly known as: ZESTRIL Take 1 tablet (40 mg total) by mouth daily.   loratadine 10 MG tablet Commonly known as: CLARITIN Take 10 mg by mouth daily.   metFORMIN 1000 MG tablet Commonly known as: GLUCOPHAGE Take 1 tablet (1,000 mg total) by mouth daily with breakfast. What changed:  medication strength how much to take when to take this Changed by: Howard Pouch, DO   vitamin B-12 1000 MCG tablet Commonly known as: CYANOCOBALAMIN Take 1,000 mcg by mouth daily.         All past medical history, surgical history, allergies, family history, immunizations andmedications were updated in the EMR today and reviewed under the history and medication portions of their EMR.     Recent Results (from the past 2160 hour(s))  ECHOCARDIOGRAM COMPLETE     Status: None   Collection Time: 09/04/20  4:52 PM  Result Value Ref Range   Area-P 1/2 3.20 cm2   S' Lateral 2.20 cm  POCT HgB A1C     Status: Abnormal   Collection Time: 10/24/20  8:09 AM  Result Value Ref Range   Hemoglobin A1C 6.9 (A) 4.0 - 5.6 %   HbA1c POC (<> result, manual entry) 6.9 4.0 - 5.6 %   HbA1c, POC (prediabetic range) 6.9 (A) 5.7 - 6.4 %   HbA1c, POC (controlled diabetic range) 6.9 0.0 - 7.0 %     ROS: 14 pt review of systems  performed and negative (unless mentioned in an HPI)  Objective: BP (!) 149/83   Pulse 63   Temp 98.1 F (36.7 C) (Oral)   Resp 16   Ht 5' 1.75" (1.568 m)   Wt 178 lb 12.8 oz (81.1 kg)   LMP 04/27/2005 (Approximate)   SpO2 98%   BMI 32.97 kg/m  Gen: Afebrile. No acute distress. Nontoxic. Pleasant obese female.  HENT: AT. [place]. MMM. No cough, no hoarseness. Eyes:Pupils Equal Round Reactive to light, Extraocular movements intact,  Conjunctiva without redness, discharge or icterus. Neck/lymp/endocrine: Supple,no lymphadenopathy, no thyromegaly CV: RRR 1/6 SM, no edema, +2/4 P posterior tibialis pulses Chest: CTAB, no wheeze or crackles Skin: no rashes, purpura or petechiae.  Neuro:  Normal gait. PERLA. EOMi. Alert. Oriented. x3 Psych: Normal affect, dress and demeanor. Normal speech. Normal thought content and judgment.    No results found.  Assessment/plan: DEAISA MERIDA is a 65 y.o. female present for CPE - re-establishment.  Type 2 diabetes mellitus -diet controlled -improving.  Increase metformin to 1000 mg qd -She was encouraged to focus on her diet and exercise  PNA series: Pneumonia series completed Flu shot: Up-to-date (recommneded yearly) Foot  exam: Completed Eye exam: Today 01/2020 A1c: 6.7>> 6.7>> 6.3>> 6.5> 6.6> 7.3> 6.9 A1c today Follow-up 5.5 months    Essential hypertension/obesity/palpitations/murmur/WCS/Diastolic dysfx- mild/regurg/enlarged aorta -Blood pressure is still above goal. HOme pressures typically 761-607 systolic.  - continue  lisinopril to 40 mg daily (was increased last visit) - start HCTZ 25 mg qd - she has cardiology est appt scheduled ina few weeks- BP will be rechecked there after changes.   Follow-up 5.5 months  B12 deficiency: Reviewed outside labs with a B12 of 138.  Patient has started to 1000 mcg of sublingual B12 greater than 3 months ago. B12 levels collected today.  If B12 levels are adequately supplemented with sublingual version, will offer B12 injections.   Seasonal allergic rhinitis due to pollen Allergy symptoms are much better controlled now that she is on Zyrtec, Flonase and Astelin.  Continue current regimen through the summer recommended.   Elevated alk phos/abnormal vitamin D: -GGT, PTH and calcium were normal. -Bone density completed with osteopenia result -Vitamin D levels collected today.  Patient is on supplementation. -Noted on outside labs that her alk phos is now in normal range.   Return in about 5 months (around 04/09/2021) for [place] (30 min).   Orders Placed This Encounter  Procedures   POCT HgB A1C   Meds ordered this encounter  Medications   lisinopril (ZESTRIL) 40 MG tablet    Sig: Take 1 tablet (40 mg total) by mouth daily.    Dispense:  90 tablet    Refill:  1   metFORMIN (GLUCOPHAGE) 1000 MG tablet    Sig: Take 1 tablet (1,000 mg total) by mouth daily with breakfast.    Dispense:  90 tablet    Refill:  1   hydrochlorothiazide (HYDRODIURIL) 25 MG tablet    Sig: Take 1 tablet (25 mg total) by mouth daily.    Dispense:  90 tablet    Refill:  1    Referral Orders  No referral(s) requested today     Electronically signed by: Howard Pouch,  [place]

## 2020-10-24 NOTE — Telephone Encounter (Signed)
Spoke with pt regarding 3 prescriptions;  hctz, lisinopril and metformin. She called the pharmacy after calling our office to confirm if medications were sent in since she does not recall Dr.Kuneff saying meds were sent. Nothing further needed

## 2020-11-01 ENCOUNTER — Encounter: Payer: Self-pay | Admitting: Cardiovascular Disease

## 2020-11-01 ENCOUNTER — Other Ambulatory Visit: Payer: Self-pay

## 2020-11-01 ENCOUNTER — Ambulatory Visit: Payer: PPO | Admitting: Cardiovascular Disease

## 2020-11-01 DIAGNOSIS — E7800 Pure hypercholesterolemia, unspecified: Secondary | ICD-10-CM | POA: Diagnosis not present

## 2020-11-01 DIAGNOSIS — I10 Essential (primary) hypertension: Secondary | ICD-10-CM | POA: Diagnosis not present

## 2020-11-01 DIAGNOSIS — R011 Cardiac murmur, unspecified: Secondary | ICD-10-CM

## 2020-11-01 NOTE — Assessment & Plan Note (Signed)
History of essential pretension blood pressure measured today 154/78.  She is on lisinopril and hydrochlorothiazide.  She says her blood pressure usually runs in the mid to high 130s over 70 at home.

## 2020-11-01 NOTE — Progress Notes (Signed)
11/01/2020 Maria Watkins   1955-12-26  [id number]  Primary Physician Natalia Leatherwood, DO Primary Cardiologist: Runell Gess MD Milagros Loll, [place], [place]  HPI:  Maria Watkins is a 65 y.o. moderately overweight married Caucasian female mother of 1 child with no grandchildren referred by Dr. Claiborne Billings, her PCP, because of cardiac murmur.  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  She smoked remotely.  She has a history of treated hypertension and diabetes.  Her father had MI in his 32s.  She is never had a heart attack or stroke.  She denies chest pain or shortness of breath.  Her PCP apparently heard a murmur and obtain an echo 09/04/2020 that showed normal LV systolic function with mild MR and moderate TR.   Current Meds  Medication Sig   azelastine (ASTELIN) 0.1 % nasal spray Place 2 sprays into both nostrils 2 (two) times daily. Use in each nostril as directed   cholecalciferol (VITAMIN D3) 25 MCG (1000 UT) tablet Take 1,000 Units by mouth daily.   fluticasone (FLONASE) 50 MCG/ACT nasal spray Place 2 sprays into both nostrils daily.   hydrochlorothiazide (HYDRODIURIL) 25 MG tablet Take 1 tablet (25 mg total) by mouth daily.   lisinopril (ZESTRIL) 40 MG tablet Take 1 tablet (40 mg total) by mouth daily.   loratadine (CLARITIN) 10 MG tablet Take 10 mg by mouth daily.   metFORMIN (GLUCOPHAGE) 1000 MG tablet Take 1 tablet (1,000 mg total) by mouth daily with breakfast.   vitamin B-12 (CYANOCOBALAMIN) 1000 MCG tablet Take 1,000 mcg by mouth daily.     Allergies  Allergen Reactions   Terconazole Nausea Only    Social History   Socioeconomic History   Marital status: Married    Spouse name: Not on file   Number of children: Not on file   Years of education: Not on file   Highest education level: Not on file  Occupational History   Occupation: retired  Tobacco Use   Smoking status: Former     Pack years: 0.00    Types: Cigarettes    Quit date: 04/28/1991    Years since quitting: 29.5   Smokeless tobacco: Never  Vaping Use   Vaping Use: Never used  Substance and Sexual Activity   Alcohol use: No   Drug use: No   Sexual activity: Never    Birth control/protection: Post-menopausal  Other Topics Concern   Not on file  Social History Narrative   Not on file   Social Determinants of Health   Financial Resource Strain: Not on file  Food Insecurity: Not on file  Transportation Needs: Not on file  Physical Activity: Not on file  Stress: Not on file  Social Connections: Not on file  Intimate Partner Violence: Not on file     Review of Systems: General: negative for chills, fever, night sweats or weight changes.  Cardiovascular: negative for chest pain, dyspnea on exertion, edema, orthopnea, palpitations, paroxysmal nocturnal dyspnea or shortness of breath Dermatological: negative for rash Respiratory: negative for cough or wheezing Urologic: negative for hematuria Abdominal: negative for nausea, vomiting, diarrhea, bright red blood per rectum, melena, or hematemesis Neurologic: negative for visual changes, syncope, or dizziness All other systems reviewed and are otherwise negative except as noted above.    Blood pressure (!) 154/78, pulse 61, height 5\' 2"  (1.575 m), weight 175 lb 3.2 oz (79.5 kg), last menstrual period  04/27/2005, SpO2 98 %.  General appearance: alert and no distress Neck: no adenopathy, no carotid bruit, no JVD, supple, symmetrical, trachea midline, and thyroid not enlarged, symmetric, no tenderness/mass/nodules Lungs: clear to auscultation bilaterally Heart: regular rate and rhythm, S1, S2 normal, no murmur, click, rub or gallop Extremities: extremities normal, atraumatic, no cyanosis or edema Pulses: 2+ and symmetric Skin: Skin color, texture, turgor normal. No rashes or lesions Neurologic: Grossly normal  EKG sinus rhythm at 61 without ST or T  wave changes.  Personally reviewed this EKG.  ASSESSMENT AND PLAN:   Essential hypertension History of essential pretension blood pressure measured today 154/78.  She is on lisinopril and hydrochlorothiazide.  She says her blood pressure usually runs in the mid to high 130s over 70 at home.  Hypercholesteremia History of hyperlipidemia not on statin therapy with lipid profile performed 11/10/2018 revealing total cholesterol of 165, LDL of 79 and HDL 46.  Murmur History of cardiac murmur with 2D echo performed 09/04/2020 revealing normal LV systolic function, grade 1 diastolic dysfunction, mild MR, moderate TR with no other valvular abnormalities.  The patient is completely asymptomatic.     Runell Gess MD FACP,FACC,FAHA, [facility] 11/01/2020 3:24 PM

## 2020-11-01 NOTE — Patient Instructions (Signed)
Medication Instructions:  No Changes In Medications at this time.  *If you need a refill on your cardiac medications before your next appointment, please call your pharmacy*  Follow-Up: At [facility], you and your health needs are our priority.  As part of our continuing mission to provide you with exceptional heart care, we have created designated Provider Care Teams.  These Care Teams include your primary Cardiologist (physician) and Advanced Practice Providers (APPs -  Physician Assistants and Nurse Practitioners) who all work together to provide you with the care you need, when you need it.  Your next appointment:   AS NEEDED   The format for your next appointment:   In Person  Provider:   Jonathan Berry, MD  

## 2020-11-01 NOTE — Assessment & Plan Note (Signed)
History of cardiac murmur with 2D echo performed 09/04/2020 revealing normal LV systolic function, grade 1 diastolic dysfunction, mild MR, moderate TR with no other valvular abnormalities.  The patient is completely asymptomatic.

## 2020-11-01 NOTE — Assessment & Plan Note (Signed)
History of hyperlipidemia not on statin therapy with lipid profile performed 11/10/2018 revealing total cholesterol of 165, LDL of 79 and HDL 46.

## 2020-11-11 ENCOUNTER — Telehealth: Payer: Self-pay

## 2020-11-11 NOTE — Telephone Encounter (Signed)
Scheduled pt for HTN f/u.  Pt would like to know instructions in mean time can she d/c medication

## 2020-11-11 NOTE — Telephone Encounter (Signed)
I would encourage her to try to take a half a tab daily.  Her blood pressures that she is reporting are really good.  We will what she may be experiencing is more dehydration symptoms, since this is a diuretic or decreased electrolytes.  I would encourage her to take half a tablet daily and hydrate with electrolyte replacement that is low in sugar such as Gatorade 0.

## 2020-11-11 NOTE — Telephone Encounter (Signed)
Patient calling in regards to blood pressure medication she just started around 10/24/20.  Since then, she has felt very "droopy, tired, almost dizzy feeling".  She does not want to take the new medication,  hydrochlorothiazide (HYDRODIURIL) 25 MG tablet [[id number]]   Her blood pressure reading from this morning are 128 / 70  Pulse 82 111 / 70  Pulse N/a (forgot to right down)  Please call patient at [phone]

## 2020-11-12 NOTE — Telephone Encounter (Signed)
Spoke with patient regarding results/recommendations.  

## 2020-11-14 ENCOUNTER — Telehealth: Payer: Self-pay | Admitting: Family Medicine

## 2020-11-14 NOTE — Telephone Encounter (Signed)
Pt is concerned about her blood pressure being too low since the change of medication. Her bp reading are 98/70,108/74, 106/63, 116/71 pulse 66. Pt states that she will not be taking HCTZ again until discussed with PCP as she is worried she may pass out. Pt wanted to know if she could be worked in before her Monday appt

## 2020-11-14 NOTE — Telephone Encounter (Signed)
Patient has a question about her new medication, HCTZ. She has appt scheduled Monday but would like to speak with someone today.

## 2020-11-15 NOTE — Telephone Encounter (Signed)
There are no openings prior to Monday. She was asked to take a half a tab of the HCTZ.  If she did not do that, I would request that she at least try.  If she did take a half a tab of the HCTZ and she still felt her blood pressures were low, she can discontinue until we see each other Monday.

## 2020-11-15 NOTE — Telephone Encounter (Signed)
Spoke with pt and informed her of provider's instructions. Pt states that she will d/c HCTZ until appt and verbalized understanding.

## 2020-11-18 ENCOUNTER — Encounter: Payer: Self-pay | Admitting: Family Medicine

## 2020-11-18 ENCOUNTER — Ambulatory Visit (INDEPENDENT_AMBULATORY_CARE_PROVIDER_SITE_OTHER): Payer: PPO | Admitting: Family Medicine

## 2020-11-18 ENCOUNTER — Other Ambulatory Visit: Payer: Self-pay

## 2020-11-18 VITALS — BP 129/76 | HR 63 | Temp 97.9°F | Ht 62.0 in | Wt 178.0 lb

## 2020-11-18 DIAGNOSIS — I10 Essential (primary) hypertension: Secondary | ICD-10-CM | POA: Diagnosis not present

## 2020-11-18 NOTE — Patient Instructions (Signed)
Blood pressure looks great today.  You can discontinue the HCTZ.  Normal BP 100-135/60-80.  If BP above - try not stress- it only matters if it consistently stays above goal for days.  Your blood pressure easily reacts to activity and mood/stress etc.    Try to make sure to maintain hydration 4 bottles of water a day (5 if working out in the heat).

## 2020-11-18 NOTE — Progress Notes (Signed)
This visit occurred during the SARS-CoV-2 public health emergency.  Safety protocols were in place, including screening questions prior to the visit, additional usage of staff PPE, and extensive cleaning of exam room while observing appropriate contact time as indicated for disinfecting solutions.    Patient ID: Maria Watkins, female  DOB: [birth year], 65 y.o.   MRN: [id number] Patient Care Team    Relationship Specialty Notifications Start End  Natalia Leatherwood, DO PCP - General Family Medicine  10/27/18   Charna Elizabeth, MD Consulting Physician Gastroenterology  01/18/18   Marlow Baars, MD Consulting Physician Obstetrics  01/18/18   Blima Ledger, OD  Optometry  11/10/18     Chief Complaint  Patient presents with   Hypertension    Home Bps are 142-106/61-81; pt BP machine reads 128/71 pulse 59    Subjective: Maria Watkins is a 65 y.o.  Female  present for  Essential hypertension/morbid obesity/palpitations/HLD/valve regurg/enlarged aorta Pt reports compliance with lisinopril 40 mg daily. She was started on hctz 25 mg qd last appt and reports it was making her feel more fatigued. Patient denies chest pain, shortness of breath, dizziness or lower extremity edema.  She is now established with cardio.  She reports her home BP 106-142/61-81 at home.  Diet: Regular Exercise: Routine exercise RF: Hypertension, diabetes, obesity, former smoker, family history of heart disease   Depression screen [facility] 2/9 07/25/2020 07/28/2018  Decreased Interest 0 0  Down, Depressed, Hopeless 0 0  PHQ - 2 Score 0 0   No flowsheet data found.   Immunization History  Administered Date(s) Administered   Fluad Quad(high Dose 65+) 01/16/2019   Influenza Inj Mdck Quad With Preservative 12/27/2018   Influenza, High Dose Seasonal PF 01/16/2019, 02/20/2020   Influenza,inj,Quad PF,6+ Mos 01/18/2018   Moderna Sars-Covid-2 Vaccination 06/08/2019, 07/07/2019   Pneumococcal Conjugate-13 02/23/2020   Pneumococcal  Polysaccharide-23 04/14/2018   Tdap 04/27/2013   Past Medical History:  Diagnosis Date   Abnormal Pap smear of cervix 1980's   resolved post cryo with normal follow up   Depression    Elevated alkaline phosphatase level 05/15/2019   Estrogen deficiency 05/15/2019   Gallstones 08/04/2012   GERD (gastroesophageal reflux disease) 11/09/2019   Hypertension    Lymphadenopathy 08/02/2019   Type 2 diabetes mellitus without complication, without long-term current use of insulin (HCC) 01/18/2018   Allergies  Allergen Reactions   Terconazole Nausea Only   Past Surgical History:  Procedure Laterality Date   CERVIX LESION DESTRUCTION  1980s   COLPOSCOPY  1980's   Family History  Problem Relation Age of Onset   Diabetes Mother    Arthritis Mother    COPD Father    Asthma Father    Heart disease Father    Diabetes Sister    Asthma Brother    COPD Sister    Rheum arthritis Sister    Pulmonary fibrosis Sister        Passed away 04-12-2020  Breast cancer Maternal Aunt    Alcohol abuse Son    Social History   Social History Narrative   Not on file    Allergies as of 11/18/2020       Reactions   Terconazole Nausea Only        Medication List        Accurate as of November 18, 2020  2:33 PM. If you have any questions, ask your nurse or doctor.          STOP  taking these medications    hydrochlorothiazide 25 MG tablet Commonly known as: HYDRODIURIL Stopped by: Felix Pacini, DO       TAKE these medications    azelastine 0.1 % nasal spray Commonly known as: ASTELIN Place 2 sprays into both nostrils 2 (two) times daily. Use in each nostril as directed   cholecalciferol 25 MCG (1000 UNIT) tablet Commonly known as: VITAMIN D3 Take 1,000 Units by mouth daily.   fluticasone 50 MCG/ACT nasal spray Commonly known as: FLONASE Place 2 sprays into both nostrils daily.   lisinopril 40 MG tablet Commonly known as: ZESTRIL Take 1 tablet (40 mg total) by mouth daily.    loratadine 10 MG tablet Commonly known as: CLARITIN Take 10 mg by mouth daily.   metFORMIN 1000 MG tablet Commonly known as: GLUCOPHAGE Take 1 tablet (1,000 mg total) by mouth daily with breakfast.   vitamin B-12 1000 MCG tablet Commonly known as: CYANOCOBALAMIN Take 1,000 mcg by mouth daily.        All past medical history, surgical history, allergies, family history, immunizations andmedications were updated in the EMR today and reviewed under the history and medication portions of their EMR.       ROS: 14 pt review of systems performed and negative (unless mentioned in an HPI)  Objective: BP 129/76   Pulse 63   Temp 97.9 F (36.6 C) (Oral)   Ht 5\' 2"  (1.575 m)   Wt 178 lb (80.7 kg)   LMP 04/27/2005 (Approximate)   SpO2 99%   BMI 32.56 kg/m  Gen: Afebrile. No acute distress. Nontoxic pleasant female.  HENT: AT. [place].  Eyes:Pupils Equal Round Reactive to light, Extraocular movements intact,  Conjunctiva without redness, discharge or icterus. Neck/lymp/endocrine: Supple,no lymphadenopathy, no thyromegaly CV: RRR 1/6 SM, no edema Chest: CTAB, no wheeze or crackles Neuro:  Normal gait. PERLA. EOMi. Alert. Oriented x3 Psych: Normal affect, dress and demeanor. Normal speech. Normal thought content and judgment.  No results found.  Assessment/plan: Maria Watkins is a 65 y.o. female present for  Essential/hypertension/obesity/palpitations/murmur/WCS/Diastolic dysfx- mild/regurg/enlarged aorta -stable - continue  lisinopril to 40 mg daily (was increased last visit) - DC HCTZ 25 mg qd- she will continue to monitor.  - her BP readings fluctuate frequently in response to stress/anxiety etc  - reassured her today, not to worry about BP unless < 100 systolic with symptoms or consistently > 140 systolic.  F/u per her routine [facility] by last visit.    No follow-ups on file.   No orders of the defined types were placed in this encounter.  No orders of the defined types  were placed in this encounter.   Referral Orders  No referral(s) requested today     Electronically signed by: 77, DO [facility] [place]

## 2020-12-04 ENCOUNTER — Other Ambulatory Visit: Payer: Self-pay

## 2020-12-04 DIAGNOSIS — I10 Essential (primary) hypertension: Secondary | ICD-10-CM

## 2020-12-05 ENCOUNTER — Other Ambulatory Visit: Payer: Self-pay

## 2020-12-05 DIAGNOSIS — I10 Essential (primary) hypertension: Secondary | ICD-10-CM

## 2020-12-20 ENCOUNTER — Ambulatory Visit: Payer: Self-pay | Admitting: General Surgery

## 2020-12-20 DIAGNOSIS — K8020 Calculus of gallbladder without cholecystitis without obstruction: Secondary | ICD-10-CM | POA: Diagnosis not present

## 2021-01-06 NOTE — Pre-Procedure Instructions (Signed)
Surgical Instructions    Your procedure is scheduled on Monday, September 19th.  Report to [facility] Main Entrance "A" at 11:15 A.M., then check in with the Admitting office.  Call this number if you have problems the morning of surgery:  [phone]   If you have any questions prior to your surgery date call [phone]: Open Monday-Friday 8am-4pm    Remember:  Do not eat after midnight the night before your surgery  You may drink clear liquids until 10:15 a.m. the morning of your surgery.   Clear liquids allowed are: Water, Non-Citrus Juices (without pulp), Carbonated Beverages, Clear Tea, Black Coffee Only, and Gatorade. (Please choose sugar-free or diet options)    Take these medicines the morning of surgery with A SIP OF WATER  Loratadine fluticasone (FLONASE)-as needed  As of today, STOP taking any Aspirin (unless otherwise instructed by your surgeon) Aleve, Naproxen, Ibuprofen, Motrin, Advil, Goody's, BC's, all herbal medications, fish oil, and all vitamins.          WHAT DO I DO ABOUT MY DIABETES MEDICATION?   Do not take metFORMIN (GLUCOPHAGE) the morning of surgery.   HOW TO MANAGE YOUR DIABETES BEFORE AND AFTER SURGERY  Why is it important to control my blood sugar before and after surgery? Improving blood sugar levels before and after surgery helps healing and can limit problems. A way of improving blood sugar control is eating a healthy diet by:  Eating less sugar and carbohydrates  Increasing activity/exercise  Talking with your doctor about reaching your blood sugar goals High blood sugars (greater than 180 mg/dL) can raise your risk of infections and slow your recovery, so you will need to focus on controlling your diabetes during the weeks before surgery. Make sure that the doctor who takes care of your diabetes knows about your planned surgery including the date and location.  How do I manage my blood sugar before surgery? Check your blood sugar at least  4 times a day, starting 2 days before surgery, to make sure that the level is not too high or low.  Check your blood sugar the morning of your surgery when you wake up and every 2 hours until you get to the Short Stay unit.  If your blood sugar is less than 70 mg/dL, you will need to treat for low blood sugar: Do not take insulin. Treat a low blood sugar (less than 70 mg/dL) with  cup of clear juice (cranberry or apple), 4 glucose tablets, OR glucose gel. Recheck blood sugar in 15 minutes after treatment (to make sure it is greater than 70 mg/dL). If your blood sugar is not greater than 70 mg/dL on recheck, call [phone] for further instructions. Report your blood sugar to the short stay nurse when you get to Short Stay.  If you are admitted to the hospital after surgery: Your blood sugar will be checked by the staff and you will probably be given insulin after surgery (instead of oral diabetes medicines) to make sure you have good blood sugar levels. The goal for blood sugar control after surgery is 80-180 mg/dL.             Do NOT Smoke (Tobacco/Vaping) or drink Alcohol 24 hours prior to your procedure.  If you use a CPAP at night, you may bring all equipment for your overnight stay.   Contacts, glasses, piercing's, hearing aid's, dentures or partials may not be worn into surgery, please bring cases for these belongings.    For  patients admitted to the hospital, discharge time will be determined by your treatment team.   Patients discharged the day of surgery will not be allowed to drive home, and someone needs to stay with them for 24 hours.  ONLY 1 SUPPORT PERSON MAY BE PRESENT WHILE YOU ARE IN SURGERY. IF YOU ARE TO BE ADMITTED ONCE YOU ARE IN YOUR ROOM YOU WILL BE ALLOWED TWO (2) VISITORS.  Minor children may have two parents present. Special consideration for safety and communication needs will be reviewed on a case by case basis.   Special instructions:   [place]-  Preparing For Surgery  Before surgery, you can play an important role. Because skin is not sterile, your skin needs to be as free of germs as possible. You can reduce the number of germs on your skin by washing with CHG (chlorahexidine gluconate) Soap before surgery.  CHG is an antiseptic cleaner which kills germs and bonds with the skin to continue killing germs even after washing.    Oral Hygiene is also important to reduce your risk of infection.  Remember - BRUSH YOUR TEETH THE MORNING OF SURGERY WITH YOUR REGULAR TOOTHPASTE  Please do not use if you have an allergy to CHG or antibacterial soaps. If your skin becomes reddened/irritated stop using the CHG.  Do not shave (including legs and underarms) for at least 48 hours prior to first CHG shower. It is OK to shave your face.  Please follow these instructions carefully.   Shower the NIGHT BEFORE SURGERY and the MORNING OF SURGERY  If you chose to wash your hair, wash your hair first as usual with your normal shampoo.  After you shampoo, rinse your hair and body thoroughly to remove the shampoo.  Use CHG Soap as you would any other liquid soap. You can apply CHG directly to the skin and wash gently with a scrungie or a clean washcloth.   Apply the CHG Soap to your body ONLY FROM THE NECK DOWN.  Do not use on open wounds or open sores. Avoid contact with your eyes, ears, mouth and genitals (private parts). Wash Face and genitals (private parts)  with your normal soap.   Wash thoroughly, paying special attention to the area where your surgery will be performed.  Thoroughly rinse your body with warm water from the neck down.  DO NOT shower/wash with your normal soap after using and rinsing off the CHG Soap.  Pat yourself dry with a CLEAN TOWEL.  Wear CLEAN PAJAMAS to bed the night before surgery  Place CLEAN SHEETS on your bed the night before your surgery  DO NOT SLEEP WITH PETS.   Day of Surgery: Shower with CHG soap. Do not  wear jewelry, make up, nail polish, gel polish, artificial nails, or any other type of covering on natural nails including finger and toenails. If patients have artificial nails, gel coating, etc. that need to be removed by a nail salon please have this removed prior to surgery. Surgery may need to be canceled/delayed if the surgeon/ anesthesia feels like the patient is unable to be adequately monitored. Do not wear lotions, powders, perfumes, or deodorant. Do not shave 48 hours prior to surgery.  Do not bring valuables to the hospital. [facility] is not responsible for any belongings or valuables. Wear Clean/Comfortable clothing the morning of surgery Remember to brush your teeth WITH YOUR REGULAR TOOTHPASTE.   Please read over the following fact sheets that you were given.

## 2021-01-07 ENCOUNTER — Encounter (HOSPITAL_COMMUNITY)
Admission: RE | Admit: 2021-01-07 | Discharge: 2021-01-07 | Disposition: A | Discharge status: Home / Self Care | Payer: PPO | Source: Ambulatory Visit | Attending: General Surgery | Admitting: General Surgery

## 2021-01-07 ENCOUNTER — Other Ambulatory Visit: Payer: Self-pay

## 2021-01-07 ENCOUNTER — Encounter (HOSPITAL_COMMUNITY): Payer: Self-pay

## 2021-01-07 DIAGNOSIS — Z01812 Encounter for preprocedural laboratory examination: Secondary | ICD-10-CM | POA: Diagnosis not present

## 2021-01-07 DIAGNOSIS — E119 Type 2 diabetes mellitus without complications: Secondary | ICD-10-CM | POA: Insufficient documentation

## 2021-01-07 LAB — CBC
HCT: 38.3 % (ref 36.0–46.0)
Hemoglobin: 12.8 g/dL (ref 12.0–15.0)
MCH: 29.1 pg (ref 26.0–34.0)
MCHC: 33.4 g/dL (ref 30.0–36.0)
MCV: 87 fL (ref 80.0–100.0)
Platelets: 148 10*3/uL — ABNORMAL LOW (ref 150–400)
RBC: 4.4 MIL/uL (ref 3.87–5.11)
RDW: 13 % (ref 11.5–15.5)
WBC: 5.5 10*3/uL (ref 4.0–10.5)
nRBC: 0 % (ref 0.0–0.2)

## 2021-01-07 LAB — HEMOGLOBIN A1C
Hgb A1c MFr Bld: 6.6 % — ABNORMAL HIGH (ref 4.8–5.6)
Mean Plasma Glucose: 142.72 mg/dL

## 2021-01-07 LAB — COMPREHENSIVE METABOLIC PANEL
ALT: 14 U/L (ref 0–44)
AST: 16 U/L (ref 15–41)
Albumin: 4 g/dL (ref 3.5–5.0)
Alkaline Phosphatase: 104 U/L (ref 38–126)
Anion gap: 7 (ref 5–15)
BUN: 8 mg/dL (ref 8–23)
CO2: 29 mmol/L (ref 22–32)
Calcium: 9.5 mg/dL (ref 8.9–10.3)
Chloride: 102 mmol/L (ref 98–111)
Creatinine, Ser: 0.78 mg/dL (ref 0.44–1.00)
GFR, Estimated: >60 mL/min (ref >60)
Glucose, Bld: 127 mg/dL — ABNORMAL HIGH (ref 70–99)
Potassium: 4 mmol/L (ref 3.5–5.1)
Sodium: 138 mmol/L (ref 135–145)
Total Bilirubin: 1.2 mg/dL (ref 0.3–1.2)
Total Protein: 7.5 g/dL (ref 6.5–8.1)

## 2021-01-07 LAB — GLUCOSE, CAPILLARY: Glucose-Capillary: 145 mg/dL — ABNORMAL HIGH (ref 70–99)

## 2021-01-07 NOTE — Progress Notes (Signed)
PCP - Felix Pacini, DO Cardiologist - Dr. Erlene Quan  Chest x-ray - n/a EKG - 11/01/20 Stress Test - denies ECHO - 09/04/20 Cardiac Cath - denies  Sleep Study - denies CPAP - denies  CBG at PAT: 145 Does not check CBG at home but does have the materials to do so. Last A1C on 10/24/20 was 6.9. Will collect A1C today.  Blood Thinner Instructions: n/a Aspirin Instructions:n/a  ERAS Protcol -clears until 1015 DOS PRE-SURGERY Ensure or G2- none  COVID TEST- not indicated, ambulatory surgery  Anesthesia review: No  Patient denies shortness of breath, fever, cough and chest pain at PAT appointment   All instructions explained to the patient, with a verbal understanding of the material. Patient agrees to go over the instructions while at home for a better understanding. Patient also instructed to self quarantine after being tested for COVID-19. The opportunity to ask questions was provided.

## 2021-01-13 ENCOUNTER — Other Ambulatory Visit: Payer: Self-pay

## 2021-01-13 ENCOUNTER — Ambulatory Visit (HOSPITAL_COMMUNITY)
Admission: RE | Admit: 2021-01-13 | Discharge: 2021-01-13 | Disposition: A | Discharge status: Home / Self Care | Payer: PPO | Source: Ambulatory Visit | Attending: General Surgery | Admitting: General Surgery

## 2021-01-13 ENCOUNTER — Encounter (HOSPITAL_COMMUNITY)
Admission: RE | Disposition: A | Discharge status: Home / Self Care | Payer: Self-pay | Source: Ambulatory Visit | Attending: General Surgery

## 2021-01-13 ENCOUNTER — Ambulatory Visit (HOSPITAL_COMMUNITY): Payer: PPO | Admitting: Anesthesiology

## 2021-01-13 ENCOUNTER — Ambulatory Visit (HOSPITAL_COMMUNITY): Payer: PPO

## 2021-01-13 ENCOUNTER — Encounter (HOSPITAL_COMMUNITY): Payer: Self-pay | Admitting: General Surgery

## 2021-01-13 DIAGNOSIS — Z9049 Acquired absence of other specified parts of digestive tract: Secondary | ICD-10-CM | POA: Diagnosis not present

## 2021-01-13 DIAGNOSIS — I10 Essential (primary) hypertension: Secondary | ICD-10-CM | POA: Diagnosis not present

## 2021-01-13 DIAGNOSIS — E7800 Pure hypercholesterolemia, unspecified: Secondary | ICD-10-CM | POA: Diagnosis not present

## 2021-01-13 DIAGNOSIS — Z79899 Other long term (current) drug therapy: Secondary | ICD-10-CM | POA: Insufficient documentation

## 2021-01-13 DIAGNOSIS — Z7984 Long term (current) use of oral hypoglycemic drugs: Secondary | ICD-10-CM | POA: Insufficient documentation

## 2021-01-13 DIAGNOSIS — Z87891 Personal history of nicotine dependence: Secondary | ICD-10-CM | POA: Diagnosis not present

## 2021-01-13 DIAGNOSIS — Z419 Encounter for procedure for purposes other than remedying health state, unspecified: Secondary | ICD-10-CM

## 2021-01-13 DIAGNOSIS — E559 Vitamin D deficiency, unspecified: Secondary | ICD-10-CM | POA: Diagnosis not present

## 2021-01-13 DIAGNOSIS — K8010 Calculus of gallbladder with chronic cholecystitis without obstruction: Secondary | ICD-10-CM | POA: Diagnosis not present

## 2021-01-13 DIAGNOSIS — K8020 Calculus of gallbladder without cholecystitis without obstruction: Secondary | ICD-10-CM | POA: Diagnosis not present

## 2021-01-13 DIAGNOSIS — K8080 Other cholelithiasis without obstruction: Secondary | ICD-10-CM | POA: Diagnosis present

## 2021-01-13 HISTORY — PX: CHOLECYSTECTOMY: SHX55

## 2021-01-13 LAB — GLUCOSE, CAPILLARY
Glucose-Capillary: 132 mg/dL — ABNORMAL HIGH (ref 70–99)
Glucose-Capillary: 171 mg/dL — ABNORMAL HIGH (ref 70–99)

## 2021-01-13 SURGERY — LAPAROSCOPIC CHOLECYSTECTOMY WITH INTRAOPERATIVE CHOLANGIOGRAM
Anesthesia: General

## 2021-01-13 MED ORDER — CHLORHEXIDINE GLUCONATE CLOTH 2 % EX PADS: 6.0000 | MEDICATED_PAD | Freq: Once | CUTANEOUS | Status: DC

## 2021-01-13 MED ORDER — PHENYLEPHRINE 40 MCG/ML (10ML) SYRINGE FOR IV PUSH (FOR BLOOD PRESSURE SUPPORT)
PREFILLED_SYRINGE | INTRAVENOUS | Status: DC | PRN
  Administered 2021-01-13 (×3): 40 ug via INTRAVENOUS
  Administered 2021-01-13: 80 ug via INTRAVENOUS

## 2021-01-13 MED ORDER — SODIUM CHLORIDE 0.9 % IR SOLN
Status: DC | PRN
  Administered 2021-01-13: 1000 mL

## 2021-01-13 MED ORDER — ONDANSETRON HCL 4 MG/2ML IJ SOLN
INTRAMUSCULAR | Status: AC
  Filled 2021-01-13: qty 2

## 2021-01-13 MED ORDER — FENTANYL CITRATE (PF) 250 MCG/5ML IJ SOLN
INTRAMUSCULAR | Status: DC | PRN
  Administered 2021-01-13: 100 ug via INTRAVENOUS
  Administered 2021-01-13 (×3): 50 ug via INTRAVENOUS

## 2021-01-13 MED ORDER — LIDOCAINE 2% (20 MG/ML) 5 ML SYRINGE
INTRAMUSCULAR | Status: DC | PRN
  Administered 2021-01-13: 100 mg via INTRAVENOUS

## 2021-01-13 MED ORDER — HYDRALAZINE HCL 20 MG/ML IJ SOLN
INTRAMUSCULAR | Status: AC
  Filled 2021-01-13: qty 1

## 2021-01-13 MED ORDER — PROPOFOL 10 MG/ML IV BOLUS
INTRAVENOUS | Status: AC
  Filled 2021-01-13: qty 20

## 2021-01-13 MED ORDER — HYDRALAZINE HCL 20 MG/ML IJ SOLN
10.0000 mg | Freq: Once | INTRAMUSCULAR | Status: AC
  Administered 2021-01-13: 10 mg via INTRAVENOUS

## 2021-01-13 MED ORDER — CELECOXIB 200 MG PO CAPS
200.0000 mg | ORAL_CAPSULE | ORAL | Status: AC
  Administered 2021-01-13: 200 mg via ORAL
  Filled 2021-01-13: qty 1

## 2021-01-13 MED ORDER — ONDANSETRON HCL 4 MG/2ML IJ SOLN
4.0000 mg | Freq: Once | INTRAMUSCULAR | Status: AC | PRN
  Administered 2021-01-13: 4 mg via INTRAVENOUS

## 2021-01-13 MED ORDER — GABAPENTIN 300 MG PO CAPS
300.0000 mg | ORAL_CAPSULE | ORAL | Status: AC
  Administered 2021-01-13: 300 mg via ORAL
  Filled 2021-01-13: qty 1

## 2021-01-13 MED ORDER — OXYCODONE HCL 5 MG/5ML PO SOLN: 5.0000 mg | Freq: Once | ORAL | Status: DC | PRN

## 2021-01-13 MED ORDER — ACETAMINOPHEN 500 MG PO TABS
1000.0000 mg | ORAL_TABLET | ORAL | Status: AC
  Administered 2021-01-13: 1000 mg via ORAL
  Filled 2021-01-13: qty 2

## 2021-01-13 MED ORDER — CEFAZOLIN SODIUM-DEXTROSE 2-4 GM/100ML-% IV SOLN
2.0000 g | INTRAVENOUS | Status: AC
  Administered 2021-01-13: 2 g via INTRAVENOUS
  Filled 2021-01-13: qty 100

## 2021-01-13 MED ORDER — PROPOFOL 10 MG/ML IV BOLUS
INTRAVENOUS | Status: DC | PRN
  Administered 2021-01-13: 150 mg via INTRAVENOUS

## 2021-01-13 MED ORDER — FENTANYL CITRATE (PF) 250 MCG/5ML IJ SOLN
INTRAMUSCULAR | Status: AC
  Filled 2021-01-13: qty 5

## 2021-01-13 MED ORDER — HYDRALAZINE HCL 20 MG/ML IJ SOLN
5.0000 mg | Freq: Once | INTRAMUSCULAR | Status: AC
  Administered 2021-01-13: 5 mg via INTRAVENOUS

## 2021-01-13 MED ORDER — ACETAMINOPHEN 160 MG/5ML PO SOLN: 325.0000 mg | ORAL | Status: DC | PRN

## 2021-01-13 MED ORDER — SUGAMMADEX SODIUM 200 MG/2ML IV SOLN
INTRAVENOUS | Status: DC | PRN
  Administered 2021-01-13: 300 mg via INTRAVENOUS

## 2021-01-13 MED ORDER — MIDAZOLAM HCL 2 MG/2ML IJ SOLN
INTRAMUSCULAR | Status: DC | PRN
  Administered 2021-01-13: 2 mg via INTRAVENOUS

## 2021-01-13 MED ORDER — BUPIVACAINE-EPINEPHRINE 0.25% -1:200000 IJ SOLN
INTRAMUSCULAR | Status: DC | PRN
  Administered 2021-01-13: 18 mL

## 2021-01-13 MED ORDER — ROCURONIUM BROMIDE 10 MG/ML (PF) SYRINGE
PREFILLED_SYRINGE | INTRAVENOUS | Status: DC | PRN
  Administered 2021-01-13: 60 mg via INTRAVENOUS

## 2021-01-13 MED ORDER — BUPIVACAINE-EPINEPHRINE (PF) 0.25% -1:200000 IJ SOLN
INTRAMUSCULAR | Status: AC
  Filled 2021-01-13: qty 30

## 2021-01-13 MED ORDER — DEXAMETHASONE SODIUM PHOSPHATE 10 MG/ML IJ SOLN
INTRAMUSCULAR | Status: DC | PRN
  Administered 2021-01-13: 5 mg via INTRAVENOUS

## 2021-01-13 MED ORDER — ORAL CARE MOUTH RINSE: 15.0000 mL | Freq: Once | OROMUCOSAL | Status: AC

## 2021-01-13 MED ORDER — CHLORHEXIDINE GLUCONATE 0.12 % MT SOLN
15.0000 mL | Freq: Once | OROMUCOSAL | Status: AC
  Administered 2021-01-13: 15 mL via OROMUCOSAL
  Filled 2021-01-13: qty 15

## 2021-01-13 MED ORDER — LACTATED RINGERS IV SOLN: INTRAVENOUS | Status: DC

## 2021-01-13 MED ORDER — ONDANSETRON HCL 4 MG/2ML IJ SOLN
INTRAMUSCULAR | Status: DC | PRN
  Administered 2021-01-13: 4 mg via INTRAVENOUS

## 2021-01-13 MED ORDER — 0.9 % SODIUM CHLORIDE (POUR BTL) OPTIME
TOPICAL | Status: DC | PRN
  Administered 2021-01-13: 1000 mL

## 2021-01-13 MED ORDER — MEPERIDINE HCL 25 MG/ML IJ SOLN: 6.2500 mg | INTRAMUSCULAR | Status: DC | PRN

## 2021-01-13 MED ORDER — ACETAMINOPHEN 325 MG PO TABS: 325.0000 mg | ORAL_TABLET | ORAL | Status: DC | PRN

## 2021-01-13 MED ORDER — OXYCODONE HCL 5 MG PO TABS: 5.0000 mg | ORAL_TABLET | Freq: Once | ORAL | Status: DC | PRN

## 2021-01-13 MED ORDER — SCOPOLAMINE 1 MG/3DAYS TD PT72
1.0000 | MEDICATED_PATCH | TRANSDERMAL | Status: DC
Start: 2021-01-13 — End: 2021-01-13
  Administered 2021-01-13: 1.5 mg via TRANSDERMAL
  Filled 2021-01-13: qty 1

## 2021-01-13 MED ORDER — MIDAZOLAM HCL 2 MG/2ML IJ SOLN
INTRAMUSCULAR | Status: AC
  Filled 2021-01-13: qty 2

## 2021-01-13 MED ORDER — AMISULPRIDE (ANTIEMETIC) 5 MG/2ML IV SOLN
INTRAVENOUS | Status: AC
  Filled 2021-01-13: qty 4

## 2021-01-13 MED ORDER — SODIUM CHLORIDE 0.9 % IV SOLN: INTRAVENOUS | Status: DC | PRN

## 2021-01-13 MED ORDER — HYDROCODONE-ACETAMINOPHEN 5-325 MG PO TABS: 1.0000 | ORAL_TABLET | Freq: Four times a day (QID) | ORAL | 0 refills | Status: DC | PRN

## 2021-01-13 MED ORDER — FENTANYL CITRATE (PF) 100 MCG/2ML IJ SOLN: 25.0000 ug | INTRAMUSCULAR | Status: DC | PRN

## 2021-01-13 MED ORDER — AMISULPRIDE (ANTIEMETIC) 5 MG/2ML IV SOLN
10.0000 mg | Freq: Once | INTRAVENOUS | Status: AC
  Administered 2021-01-13: 10 mg via INTRAVENOUS

## 2021-01-13 SURGICAL SUPPLY — 43 items
ADH SKN CLS APL DERMABOND .7 (GAUZE/BANDAGES/DRESSINGS) ×1
ADH SKN CLS LQ APL DERMABOND (GAUZE/BANDAGES/DRESSINGS) ×1
APL PRP STRL LF DISP 70% ISPRP (MISCELLANEOUS) ×1
APPLIER CLIP 5 13 M/L LIGAMAX5 (MISCELLANEOUS) ×2
APR CLP MED LRG 5 ANG JAW (MISCELLANEOUS) ×1
BAG COUNTER SPONGE SURGICOUNT (BAG) ×2 IMPLANT
BAG SPEC RTRVL LRG 6X4 10 (ENDOMECHANICALS) ×1
BAG SPNG CNTER NS LX DISP (BAG) ×1
BLADE CLIPPER SURG (BLADE) IMPLANT
CANISTER SUCT 3000ML PPV (MISCELLANEOUS) ×2 IMPLANT
CATH REDDICK CHOLANGI 4FR 50CM (CATHETERS) ×2 IMPLANT
CHLORAPREP W/TINT 26 (MISCELLANEOUS) ×2 IMPLANT
CLIP APPLIE 5 13 M/L LIGAMAX5 (MISCELLANEOUS) ×1 IMPLANT
COVER MAYO STAND STRL (DRAPES) ×2 IMPLANT
COVER SURGICAL LIGHT HANDLE (MISCELLANEOUS) ×2 IMPLANT
DERMABOND ADHESIVE PROPEN (GAUZE/BANDAGES/DRESSINGS) ×1
DERMABOND ADVANCED (GAUZE/BANDAGES/DRESSINGS) ×1
DERMABOND ADVANCED .7 DNX12 (GAUZE/BANDAGES/DRESSINGS) ×1 IMPLANT
DERMABOND ADVANCED .7 DNX6 (GAUZE/BANDAGES/DRESSINGS) IMPLANT
DRAPE C-ARM 42X120 X-RAY (DRAPES) ×2 IMPLANT
ELECT REM PT RETURN 9FT ADLT (ELECTROSURGICAL) ×2
ELECTRODE REM PT RTRN 9FT ADLT (ELECTROSURGICAL) ×1 IMPLANT
GLOVE SURG ENC MOIS LTX SZ7.5 (GLOVE) ×2 IMPLANT
GOWN STRL REUS W/ TWL LRG LVL3 (GOWN DISPOSABLE) ×3 IMPLANT
GOWN STRL REUS W/TWL LRG LVL3 (GOWN DISPOSABLE) ×6
IV CATH 14GX2 1/4 (CATHETERS) ×2 IMPLANT
KIT BASIN OR (CUSTOM PROCEDURE TRAY) ×2 IMPLANT
KIT TURNOVER KIT B (KITS) ×2 IMPLANT
NS IRRIG 1000ML POUR BTL (IV SOLUTION) ×2 IMPLANT
PAD ARMBOARD 7.5X6 YLW CONV (MISCELLANEOUS) ×2 IMPLANT
POUCH SPECIMEN RETRIEVAL 10MM (ENDOMECHANICALS) ×2 IMPLANT
SCISSORS LAP 5X35 DISP (ENDOMECHANICALS) ×2 IMPLANT
SET IRRIG TUBING LAPAROSCOPIC (IRRIGATION / IRRIGATOR) ×2 IMPLANT
SET TUBE SMOKE EVAC HIGH FLOW (TUBING) ×2 IMPLANT
SLEEVE ENDOPATH XCEL 5M (ENDOMECHANICALS) ×4 IMPLANT
SPECIMEN JAR SMALL (MISCELLANEOUS) ×2 IMPLANT
SUT MNCRL AB 4-0 PS2 18 (SUTURE) ×2 IMPLANT
TOWEL GREEN STERILE (TOWEL DISPOSABLE) ×2 IMPLANT
TOWEL GREEN STERILE FF (TOWEL DISPOSABLE) ×2 IMPLANT
TRAY LAPAROSCOPIC MC (CUSTOM PROCEDURE TRAY) ×2 IMPLANT
TROCAR XCEL BLUNT TIP 100MML (ENDOMECHANICALS) ×2 IMPLANT
TROCAR XCEL NON-BLD 5MMX100MML (ENDOMECHANICALS) ×2 IMPLANT
WATER STERILE IRR 1000ML POUR (IV SOLUTION) ×2 IMPLANT

## 2021-01-13 NOTE — Op Note (Signed)
01/13/2021  2:32 PM  PATIENT:  Maria Watkins  65 y.o. female  PRE-OPERATIVE DIAGNOSIS:  GALLSTONES  POST-OPERATIVE DIAGNOSIS:  GALLSTONES  PROCEDURE:  Procedure(s): LAPAROSCOPIC CHOLECYSTECTOMY WITH INTRAOPERATIVE CHOLANGIOGRAM (N/A)  SURGEON:  Surgeon(s) and Role:    * Griselda Miner, MD - Primary  PHYSICIAN ASSISTANT:   ASSISTANTS: Dr. Cherly Hensen   ANESTHESIA:   local and general  EBL:  20 mL   BLOOD ADMINISTERED:none  DRAINS: none   LOCAL MEDICATIONS USED:  MARCAINE     SPECIMEN:  Source of Specimen:  gallbladder  DISPOSITION OF SPECIMEN:  PATHOLOGY  COUNTS:  YES  TOURNIQUET:  * No tourniquets in log *  DICTATION: .Dragon Dictation    Procedure: After informed consent was obtained the patient was brought to the operating room and placed in the supine position on the operating room table. After adequate induction of general anesthesia the patient's abdomen was prepped with ChloraPrep allowed to dry and draped in usual sterile manner. An appropriate timeout was performed. The area below the umbilicus was infiltrated with quarter percent  Marcaine. A small incision was made with a 15 blade knife. The incision was carried down through the subcutaneous tissue bluntly with a hemostat and [employer] retractors. The linea alba was identified. The linea alba was incised with a 15 blade knife and each side was grasped with Coker clamps. The preperitoneal space was then probed with a hemostat until the peritoneum was opened and access was gained to the abdominal cavity. A 0 Vicryl pursestring stitch was placed in the fascia surrounding the opening. A Hassan cannula was then placed through the opening and anchored in place with the previously placed Vicryl purse string stitch. The abdomen was insufflated with carbon dioxide without difficulty. A laparoscope was inserted through the [facility] cannula in the right upper quadrant was inspected. Next the epigastric region was infiltrated with  % Marcaine. A small incision was made with a 15 blade knife. A 5 mm port was placed bluntly through this incision into the abdominal cavity under direct vision. Next 2 sites were chosen laterally on the right side of the abdomen for placement of 5 mm ports. Each of these areas was infiltrated with quarter percent Marcaine. Small stab incisions were made with a 15 blade knife. 5 mm ports were then placed bluntly through these incisions into the abdominal cavity under direct vision without difficulty. A blunt grasper was placed through the lateralmost 5 mm port and used to grasp the dome of the gallbladder and elevated anteriorly and superiorly. Another blunt grasper was placed through the other 5 mm port and used to retract the body and neck of the gallbladder. A dissector was placed through the epigastric port and using the electrocautery the peritoneal reflection at the gallbladder neck was opened. Blunt dissection was then carried out in this area until the gallbladder neck-cystic duct junction was readily identified and a good window was created. A single clip was placed on the gallbladder neck. A small  ductotomy was made just below the clip with laparoscopic scissors. A 14-gauge Angiocath was then placed through the anterior abdominal wall under direct vision. A Reddick cholangiogram catheter was then placed through the Angiocath and flushed. The catheter was then placed in the cystic duct and anchored in place with a clip. A cholangiogram was obtained that showed no filling defects good emptying into the duodenum an adequate length on the cystic duct. The anchoring clip and catheters were then removed from the patient. 3 clips  were placed proximally on the cystic duct and the duct was divided between the 2 sets of clips. Posterior to this the cystic artery was identified and again dissected bluntly in a circumferential manner until a good window  was created. 2 clips were placed proximally and one distally on  the artery and the artery was divided between the 2 sets of clips. Next a laparoscopic hook cautery device was used to separate the gallbladder from the liver bed. Prior to completely detaching the gallbladder from the liver bed the liver bed was inspected and several small bleeding points were coagulated with the electrocautery until the area was completely hemostatic. The gallbladder was then detached the rest of it from the liver bed without difficulty. A laparoscopic bag was inserted through the hassan port. The laparoscope was moved to the epigastric port. The gallbladder was placed within the bag and the bag was sealed.  The bag with the gallbladder was then removed with the [facility] cannula through the infraumbilical port without difficulty. The fascial defect was then closed with the previously placed Vicryl pursestring stitch as well as with another figure-of-eight 0 Vicryl stitch. The liver bed was inspected again and found to be hemostatic. The abdomen was irrigated with copious amounts of saline until the effluent was clear. The ports were then removed under direct vision without difficulty and were found to be hemostatic. The gas was allowed to escape. The skin incisions were all closed with interrupted 4-0 Monocryl subcuticular stitches. Dermabond dressings were applied. The patient tolerated the procedure well. At the end of the case all needle sponge and instrument counts were correct. The patient was then awakened and taken to recovery in stable condition   PLAN OF CARE: Discharge to home after PACU  PATIENT DISPOSITION:  PACU - hemodynamically stable.   Delay start of Pharmacological VTE agent (>24hrs) due to surgical blood loss or risk of bleeding: not applicable

## 2021-01-13 NOTE — Anesthesia Procedure Notes (Signed)
Procedure Name: Intubation Date/Time: 01/13/2021 1:16 PM Performed by: Shary Decamp, CRNA Pre-anesthesia Checklist: Patient identified, Patient being monitored, Timeout performed, Emergency Drugs available and Suction available Patient Re-evaluated:Patient Re-evaluated prior to induction Oxygen Delivery Method: Circle System Utilized Preoxygenation: Pre-oxygenation with 100% oxygen Induction Type: IV induction Ventilation: Mask ventilation without difficulty Laryngoscope Size: Miller and 2 Grade View: Grade I Tube type: Oral Tube size: 7.0 mm Number of attempts: 1 Airway Equipment and Method: Stylet Placement Confirmation: ETT inserted through vocal cords under direct vision, positive ETCO2 and breath sounds checked- equal and bilateral Secured at: 21 cm Tube secured with: Tape Dental Injury: Teeth and Oropharynx as per pre-operative assessment

## 2021-01-13 NOTE — Anesthesia Postprocedure Evaluation (Signed)
Anesthesia Post Note  Patient: Maria Watkins  Procedure(s) Performed: LAPAROSCOPIC CHOLECYSTECTOMY WITH INTRAOPERATIVE CHOLANGIOGRAM     Patient location during evaluation: PACU Anesthesia Type: General Level of consciousness: sedated Pain management: pain level controlled Vital Signs Assessment: post-procedure vital signs reviewed and stable Respiratory status: spontaneous breathing and respiratory function stable Cardiovascular status: stable Postop Assessment: no apparent nausea or vomiting Anesthetic complications: no   No notable events documented.  Last Vitals:  Vitals:   01/13/21 1545 01/13/21 1600  BP: (!) 186/81 114/69  Pulse: 89 88  Resp: 20 18  Temp:  36.6 C  SpO2: 97% 96%    Last Pain:  Vitals:   01/13/21 1600  TempSrc:   PainSc: 0-No pain                 Adren Dollins DANIEL

## 2021-01-13 NOTE — H&P (Signed)
PROVIDER: Lindell Noe, MD  MRN: [id number] DOB: 04-Jan-1956 Subjective   Chief Complaint: No chief complaint on file.   History of Present Illness: Maria Watkins is a 65 y.o. female who is seen today for gallstones. The patient is a 64 year old white female whose had a known 3 cm gallstone for the last 5 to 6 years. She has occasional discomfort in the right upper quadrant but has not had an acute painful episode since her last visit. She is now ready to schedule her definitive surgery.  Review of Systems: A complete review of systems was obtained from the patient. I have reviewed this information and discussed as appropriate with the patient. See HPI as well for other ROS.  ROS   Medical History: History reviewed. No pertinent past medical history.  There is no problem list on file for this patient.  History reviewed. No pertinent surgical history.   No Known Allergies  Current Outpatient Medications on File Prior to Visit  Medication Sig Dispense Refill   cyanocobalamin (VITAMIN B12) 1000 MCG tablet Take by mouth   ergocalciferol, vitamin D2, 1,250 mcg (50,000 unit) capsule ergocalciferol (vitamin D2) 1,250 mcg (50,000 unit) capsule TAKE 1 CAPSULE BY MOUTH 2 TIMES A WEEK WITH FOOD   lisinopriL (ZESTRIL) 10 MG tablet lisinopril 10 mg tablet TAKE 1 TABLET BY MOUTH DAILY   metFORMIN (GLUCOPHAGE) 1000 MG tablet Take by mouth   No current facility-administered medications on file prior to visit.   History reviewed. No pertinent family history.   Social History   Tobacco Use  Smoking Status Not on file  Smokeless Tobacco Not on file    Social History   Socioeconomic History   Marital status: Married   Objective:   There were no vitals filed for this visit.  There is no height or weight on file to calculate BMI.  Physical Exam Vitals reviewed.  Constitutional:  General: She is not in acute distress. Appearance: Normal appearance.  HENT:  Head:  Normocephalic and atraumatic.  Right Ear: External ear normal.  Left Ear: External ear normal.  Nose: Nose normal.  Mouth/Throat:  Mouth: Mucous membranes are moist.  Pharynx: Oropharynx is clear.  Eyes:  General: No scleral icterus. Extraocular Movements: Extraocular movements intact.  Conjunctiva/sclera: Conjunctivae normal.  Pupils: Pupils are equal, round, and reactive to light.  Cardiovascular:  Rate and Rhythm: Normal rate and regular rhythm.  Pulses: Normal pulses.  Heart sounds: Normal heart sounds.  Pulmonary:  Effort: Pulmonary effort is normal. No respiratory distress.  Breath sounds: Normal breath sounds.  Abdominal:  General: Bowel sounds are normal.  Palpations: Abdomen is soft.  Tenderness: There is no abdominal tenderness.  Musculoskeletal:  General: No swelling, tenderness or deformity. Normal range of motion.  Cervical back: Normal range of motion and neck supple.  Skin: General: Skin is warm and dry.  Coloration: Skin is not jaundiced.  Neurological:  General: No focal deficit present.  Mental Status: She is alert and oriented to person, place, and time.  Psychiatric:  Mood and Affect: Mood normal.  Behavior: Behavior normal.     Labs, Imaging and Diagnostic Testing:  Assessment and Plan:  Diagnoses and all orders for this visit:  Calculus of gallbladder without cholecystitis without obstruction    The patient has a symptomatic large stone in the gallbladder. Because of the risk of further painful episodes and possible pancreatitis I feel she would benefit from having her gallbladder removed. She would also like to have this done. I  have discussed with her in detail the risks and benefits of the operation to remove the gallbladder as well as some of the technical aspects including the risk of common duct injury and the possibility of fistula given the large size of the stone and she understands and wishes to proceed

## 2021-01-13 NOTE — Anesthesia Preprocedure Evaluation (Signed)
Anesthesia Evaluation  Patient identified by MRN, date of birth, ID band Patient awake    Reviewed: Allergy & Precautions, H&P , NPO status , Patient's Chart, lab work & pertinent test results, reviewed documented beta blocker date and time   Airway Mallampati: II  TM Distance: >3 FB Neck ROM: full    Dental no notable dental hx.    Pulmonary neg pulmonary ROS, former smoker,    Pulmonary exam normal breath sounds clear to auscultation       Cardiovascular Exercise Tolerance: Good hypertension, Pt. on medications  Rhythm:regular Rate:Normal  ECHO 5/22 1. Left ventricular ejection fraction, by estimation, is 60 to 65%. The  left ventricle has normal function. The left ventricle has no regional  wall motion abnormalities. Left ventricular diastolic parameters are  consistent with Grade I diastolic  dysfunction (impaired relaxation). Elevated left atrial pressure.  2. Right ventricular systolic function is normal. The right ventricular  size is mildly enlarged. There is normal pulmonary artery systolic  pressure. The estimated right ventricular systolic pressure is 33.0 mmHg.  3. The mitral valve is normal in structure. Mild mitral valve  regurgitation. No evidence of mitral stenosis.  4. Tricuspid valve regurgitation is moderate.  5. The aortic valve is normal in structure. Aortic valve regurgitation is  not visualized. No aortic stenosis is present.  6. There is borderline dilatation of the ascending aorta, measuring 39  mm.  7. The inferior vena cava is normal in size with greater than 50%  respiratory variability, suggesting right atrial pressure of 3 mmHg.   Neuro/Psych negative neurological ROS  negative psych ROS   GI/Hepatic negative GI ROS, Neg liver ROS,   Endo/Other  negative endocrine ROSdiabetes, Type 2, Oral Hypoglycemic Agents  Renal/GU negative Renal ROS  negative genitourinary   Musculoskeletal    Abdominal   Peds  Hematology negative hematology ROS (+)   Anesthesia Other Findings   Reproductive/Obstetrics negative OB ROS                             Anesthesia Physical Anesthesia Plan  ASA: 3  Anesthesia Plan: General   Post-op Pain Management:    Induction:   PONV Risk Score and Plan: 3 and Ondansetron and Treatment may vary due to age or medical condition  Airway Management Planned: Oral ETT  Additional Equipment:   Intra-op Plan:   Post-operative Plan:   Informed Consent: I have reviewed the patients History and Physical, chart, labs and discussed the procedure including the risks, benefits and alternatives for the proposed anesthesia with the patient or authorized representative who has indicated his/her understanding and acceptance.     Dental Advisory Given  Plan Discussed with: CRNA  Anesthesia Plan Comments:         Anesthesia Quick Evaluation

## 2021-01-13 NOTE — Transfer of Care (Signed)
Immediate Anesthesia Transfer of Care Note  Patient: Maria Watkins  Procedure(s) Performed: LAPAROSCOPIC CHOLECYSTECTOMY WITH INTRAOPERATIVE CHOLANGIOGRAM  Patient Location: PACU  Anesthesia Type:General  Level of Consciousness: awake, alert  and oriented  Airway & Oxygen Therapy: Patient connected to face mask oxygen  Post-op Assessment: Report given to RN, Post -op Vital signs reviewed and stable and Patient moving all extremities X 4  Post vital signs: Reviewed  Last Vitals:  Vitals Value Taken Time  BP 210/104 01/13/21 1444  Temp 98.6   Pulse 67 01/13/21 1444  Resp 15 01/13/21 1444  SpO2 100 % 01/13/21 1444  Vitals shown include unvalidated device data.  Last Pain:  Vitals:   01/13/21 1155  TempSrc:   PainSc: 0-No pain      Patients Stated Pain Goal: 3 (01/13/21 1155)  Complications: No notable events documented.

## 2021-01-14 ENCOUNTER — Encounter (HOSPITAL_COMMUNITY): Payer: Self-pay | Admitting: General Surgery

## 2021-01-14 LAB — SURGICAL PATHOLOGY

## 2021-01-19 ENCOUNTER — Ambulatory Visit: Payer: PPO

## 2021-01-19 ENCOUNTER — Telehealth: Payer: Self-pay

## 2021-01-19 NOTE — Telephone Encounter (Signed)
01/19/2021 Name: Maria Watkins MRN: [id number] DOB: [birth year]  Unsuccessful outbound call made today to assist with:   AWV via audio visit  Outreach Attempt:  1st Attempt  A HIPAA compliant voice message was left requesting a return call.  Instructed patient to call back at [phone].  Valarie Cones

## 2021-01-20 ENCOUNTER — Ambulatory Visit (INDEPENDENT_AMBULATORY_CARE_PROVIDER_SITE_OTHER): Payer: PPO

## 2021-01-20 DIAGNOSIS — Z0000 Encounter for general adult medical examination without abnormal findings: Secondary | ICD-10-CM | POA: Diagnosis not present

## 2021-01-20 NOTE — Progress Notes (Signed)
Subjective:   Maria Watkins is a 65 y.o. female who presents for an Initial Medicare Annual Wellness Visit.  I connected with  [name] on 01/19/21 by a audio enabled telemedicine application and verified that I am speaking with the correct person using two identifiers.   I discussed the limitations of evaluation and management by telemedicine. The patient expressed understanding and agreed to proceed.  Location of patient: Home   Location of provider: Office       Persons participating in visit: [name] (patient) and Valarie Cones, LPN    Review of Systems    Defer to PCP       Objective:    There were no vitals filed for this visit. There is no height or weight on file to calculate BMI.  Advanced Directives 01/20/2021 01/19/2021 01/13/2021 01/07/2021  Does Patient Have a Medical Advance Directive? No No No No  Would patient like information on creating a medical advance directive? No - Patient declined No - Patient declined No - Patient declined No - Patient declined    Current Medications (verified) Outpatient Encounter Medications as of 01/20/2021  Medication Sig   azelastine (ASTELIN) 0.1 % nasal spray Place 2 sprays into both nostrils 2 (two) times daily. Use in each nostril as directed   cholecalciferol (VITAMIN D3) 25 MCG (1000 UT) tablet Take 1,000 Units by mouth daily.   fluticasone (FLONASE) 50 MCG/ACT nasal spray Place 2 sprays into both nostrils daily. (Patient taking differently: Place 2 sprays into both nostrils daily as needed for allergies.)   HYDROcodone-acetaminophen (NORCO/VICODIN) 5-325 MG tablet Take 1 tablet by mouth every 6 (six) hours as needed for moderate pain or severe pain.   lisinopril (ZESTRIL) 40 MG tablet Take 1 tablet (40 mg total) by mouth daily.   Loratadine 10 MG CHEW Chew 10 mg by mouth daily as needed for allergies.   metFORMIN (GLUCOPHAGE) 1000 MG tablet Take 1 tablet (1,000 mg total) by mouth daily with breakfast.    vitamin B-12 (CYANOCOBALAMIN) 1000 MCG tablet Take 1,000 mcg by mouth daily.   No facility-administered encounter medications on file as of 01/20/2021.    Allergies (verified) Terconazole   History: Past Medical History:  Diagnosis Date   Abnormal Pap smear of cervix 1980's   resolved post cryo with normal follow up   Depression    Elevated alkaline phosphatase level 05/15/2019   Estrogen deficiency 05/15/2019   Gallstones 08/04/2012   GERD (gastroesophageal reflux disease) 11/09/2019   Hypertension    Lymphadenopathy 08/02/2019   Type 2 diabetes mellitus without complication, without long-term current use of insulin (HCC) 01/18/2018   Past Surgical History:  Procedure Laterality Date   CERVIX LESION DESTRUCTION  1980s   CHOLECYSTECTOMY N/A 01/13/2021   Procedure: LAPAROSCOPIC CHOLECYSTECTOMY WITH INTRAOPERATIVE CHOLANGIOGRAM;  Surgeon: Griselda Miner, MD;  Location: [facility] OR;  Service: General;  Laterality: N/A;   COLPOSCOPY  1980's   Family History  Problem Relation Age of Onset   Diabetes Mother    Arthritis Mother    COPD Father    Asthma Father    Heart disease Father    Diabetes Sister    Asthma Brother    COPD Sister    Rheum arthritis Sister    Pulmonary fibrosis Sister        Passed away 2020-04-25  Breast cancer Maternal Aunt    Alcohol abuse Son    Social History   Socioeconomic History   Marital status:  Married    Spouse name: Not on file   Number of children: Not on file   Years of education: Not on file   Highest education level: Not on file  Occupational History   Occupation: retired  Tobacco Use   Smoking status: Former    Types: Cigarettes    Quit date: 04/28/1991    Years since quitting: 29.7   Smokeless tobacco: Never  Vaping Use   Vaping Use: Never used  Substance and Sexual Activity   Alcohol use: No   Drug use: No   Sexual activity: Never    Birth control/protection: Post-menopausal  Other Topics Concern   Not on file  Social  History Narrative   Not on file   Social Determinants of Health   Financial Resource Strain: Low Risk    Difficulty of Paying Living Expenses: Not hard at all  Food Insecurity: No Food Insecurity   Worried About Programme researcher, broadcasting/film/video in the Last Year: Never true   Ran Out of Food in the Last Year: Never true  Transportation Needs: No Transportation Needs   Lack of Transportation (Medical): No   Lack of Transportation (Non-Medical): No  Physical Activity: Insufficiently Active   Days of Exercise per Week: 7 days   Minutes of Exercise per Session: 10 min  Stress: No Stress Concern Present   Feeling of Stress : Not at all  Social Connections: Moderately Integrated   Frequency of Communication with Friends and Family: More than three times a week   Frequency of Social Gatherings with Friends and Family: Once a week   Attends Religious Services: More than 4 times per year   Active Member of [employer] or Organizations: No   Attends Engineer, structural: Never   Marital Status: Married    Tobacco Counseling Counseling given: Not Answered   Clinical Intake:  Pre-visit preparation completed: Yes  Pain : No/denies pain     Diabetes: Yes CBG done?: No Did pt. bring in CBG monitor from home?: No  How often do you need to have someone help you when you read instructions, pamphlets, or other written materials from your doctor or pharmacy?: 1 - Never What is the last grade level you completed in school?: 12th  grade and some college  Diabetic?YES  Interpreter Needed?: No      Activities of Daily Living In your present state of health, do you have any difficulty performing the following activities: 01/20/2021 01/13/2021  Hearing? N N  Vision? N N  Comment Scheduled for annual eye exam: Dr. Sherrine Maples -  Difficulty concentrating or making decisions? N N  Walking or climbing stairs? N N  Dressing or bathing? N N  Doing errands, shopping? N -  Preparing Food and eating ? N -   Using the Toilet? N -  In the past six months, have you accidently leaked urine? N -  Do you have problems with loss of bowel control? N -  Managing your Medications? N -  Managing your Finances? N -  Housekeeping or managing your Housekeeping? N -  Some recent data might be hidden    Patient Care Team: Natalia Leatherwood, DO as PCP - General (Family Medicine) Charna Elizabeth, MD as Consulting Physician (Gastroenterology) Marlow Baars, MD as Consulting Physician (Obstetrics) Blima Ledger, OD (Optometry)  Indicate any recent Medical Services you may have received from other than [facility] providers in the past year (date may be approximate).     Assessment:   This  is a routine wellness examination for [place].  Hearing/Vision screen No results found.  Dietary issues and exercise activities discussed:     Goals Addressed   None   Depression Screen PHQ 2/9 Scores 01/20/2021 01/19/2021 07/25/2020 07/28/2018  PHQ - 2 Score 0 0 0 0    Fall Risk Fall Risk  01/20/2021 01/19/2021 07/25/2020 07/28/2018  Falls in the past year? 0 0 0 0  Number falls in past yr: 0 0 0 0  Injury with Fall? 0 0 0 0  Risk for fall due to : - No Fall Risks - -  Follow up - - - Falls evaluation completed    FALL RISK PREVENTION PERTAINING TO THE HOME:  Any stairs in or around the home? Yes  If so, are there any without handrails? Yes  Home free of loose throw rugs in walkways, pet beds, electrical cords, etc? Yes  Adequate lighting in your home to reduce risk of falls? Yes   ASSISTIVE DEVICES UTILIZED TO PREVENT FALLS:  Life alert? No  Use of a cane, walker or w/c? No  Grab bars in the bathroom? No  Shower chair or bench in shower? Yes  Elevated toilet seat or a handicapped toilet? No   TIMED UP AND GO:  Was the test performed? No .  Length of time to ambulate 10 feet: n/a sec.     Cognitive Function:     6CIT Screen 01/20/2021  What Year? 0 points  What month? 0 points  What time? 0 points  Count  back from 20 0 points  Months in reverse 0 points  Repeat phrase 0 points  Total Score 0    Immunizations Immunization History  Administered Date(s) Administered   Fluad Quad(high Dose 65+) 01/16/2019   Influenza Inj Mdck Quad With Preservative 12/27/2018   Influenza, High Dose Seasonal PF 01/16/2019, 02/20/2020   Influenza,inj,Quad PF,6+ Mos 01/18/2018   Moderna Sars-Covid-2 Vaccination 06/08/2019, 07/07/2019   Pneumococcal Conjugate-13 02/23/2020   Pneumococcal Polysaccharide-23 04/14/2018   Tdap 04/27/2013    TDAP status: Up to date  Flu Vaccine status: Up to date  Pneumococcal vaccine status: Up to date  Covid-19 vaccine status: Completed vaccines  Qualifies for Shingles Vaccine? No   Zostavax completed No   Shingrix Completed?: No.    Education has been provided regarding the importance of this vaccine. Patient has been advised to call insurance company to determine out of pocket expense if they have not yet received this vaccine. Advised may also receive vaccine at local pharmacy or Health Dept. Verbalized acceptance and understanding.  Screening Tests Health Maintenance  Topic Date Due   COVID-19 Vaccine (3 - Booster for Moderna series) 12/07/2019   INFLUENZA VACCINE  11/25/2020   Zoster Vaccines- Shingrix (1 of 2) 02/18/2021 (Originally 11/05/2003)   OPHTHALMOLOGY EXAM  02/15/2021   DEXA SCAN  05/30/2021   HEMOGLOBIN A1C  07/07/2021   FOOT EXAM  07/25/2021   MAMMOGRAM  01/28/2022   TETANUS/TDAP  04/28/2023   COLONOSCOPY (Pts 45-57yrs Insurance coverage will need to be confirmed)  08/25/2024   Hepatitis C Screening  Completed   HPV VACCINES  Aged Out    Health Maintenance  Health Maintenance Due  Topic Date Due   COVID-19 Vaccine (3 - Booster for Moderna series) 12/07/2019   INFLUENZA VACCINE  11/25/2020    Colorectal cancer screening: Type of screening: Colonoscopy. Completed yes. Repeat every 10 years  Mammogram status: Completed yes. Repeat every  year: scheduled to return Oct 2022  Bone Density status: Completed yes. Results reflect: Bone density results: OSTEOPOROSIS. Repeat every 2 years.  Lung Cancer Screening: (Low Dose CT Chest recommended if Age 58-80 years, 30 pack-year currently smoking OR have quit w/in 15years.) does not qualify.   Lung Cancer Screening Referral: n/a  Additional Screening:  Hepatitis C Screening: does not qualify; Completed yes  Vision Screening: Recommended annual ophthalmology exams for early detection of glaucoma and other disorders of the eye. Is the patient up to date with their annual eye exam?  Yes  Who is the provider or what is the name of the office in which the patient attends annual eye exams? [facility] If pt is not established with a provider, would they like to be referred to a provider to establish care? Yes .   Dental Screening: Recommended annual dental exams for proper oral hygiene  Community Resource Referral / Chronic Care Management: CRR required this visit?  No   CCM required this visit?  No      Plan:     I have personally reviewed and noted the following in the patient's chart:   Medical and social history Use of alcohol, tobacco or illicit drugs  Current medications and supplements including opioid prescriptions. Patient is currently taking opioid prescriptions. Information provided to patient regarding non-opioid alternatives. Patient advised to discuss non-opioid treatment plan with their provider. Functional ability and status Nutritional status Physical activity Advanced directives List of other physicians Hospitalizations, surgeries, and ER visits in previous 12 months Vitals Screenings to include cognitive, depression, and falls Referrals and appointments  In addition, I have reviewed and discussed with patient certain preventive protocols, quality metrics, and best practice recommendations. A written personalized care plan for preventive services  as well as general preventive health recommendations were provided to patient.     Valarie Cones, LPN   06/22/3333   Nurse Notes: Non-Face to Face 45 minute visit Encounter.    Ms. Makarewicz , Thank you for taking time to come for your Medicare Wellness Visit. I appreciate your ongoing commitment to your health goals. Please review the following plan we discussed and let me know if I can assist you in the future.   These are the goals we discussed:  Goals   None     This is a list of the screening recommended for you and due dates:  Health Maintenance  Topic Date Due   COVID-19 Vaccine (3 - Booster for Moderna series) 12/07/2019   Flu Shot  11/25/2020   Zoster (Shingles) Vaccine (1 of 2) 02/18/2021*   Eye exam for diabetics  02/15/2021   DEXA scan (bone density measurement)  05/30/2021   Hemoglobin A1C  07/07/2021   Complete foot exam   07/25/2021   Mammogram  01/28/2022   Tetanus Vaccine  04/28/2023   Colon Cancer Screening  08/25/2024   Hepatitis C Screening: USPSTF Recommendation to screen - Ages 18-79 yo.  Completed   HPV Vaccine  Aged Out  *Topic was postponed. The date shown is not the original due date.

## 2021-02-20 DIAGNOSIS — Z01419 Encounter for gynecological examination (general) (routine) without abnormal findings: Secondary | ICD-10-CM | POA: Diagnosis not present

## 2021-02-20 DIAGNOSIS — Z7689 Persons encountering health services in other specified circumstances: Secondary | ICD-10-CM | POA: Diagnosis not present

## 2021-02-20 DIAGNOSIS — Z1231 Encounter for screening mammogram for malignant neoplasm of breast: Secondary | ICD-10-CM | POA: Diagnosis not present

## 2021-02-20 LAB — HM MAMMOGRAPHY

## 2021-03-12 DIAGNOSIS — S93601A Unspecified sprain of right foot, initial encounter: Secondary | ICD-10-CM | POA: Diagnosis not present

## 2021-03-19 ENCOUNTER — Other Ambulatory Visit: Payer: Self-pay

## 2021-03-19 ENCOUNTER — Ambulatory Visit (INDEPENDENT_AMBULATORY_CARE_PROVIDER_SITE_OTHER): Payer: PPO

## 2021-03-19 DIAGNOSIS — Z23 Encounter for immunization: Secondary | ICD-10-CM | POA: Diagnosis not present

## 2021-04-08 ENCOUNTER — Telehealth: Payer: Self-pay | Admitting: Family Medicine

## 2021-04-08 NOTE — Telephone Encounter (Signed)
Pt called and said she was having symptoms but didn't go into much detail and asked if she could be put on the schedule for tomorrow for Dr Claiborne Billings, I let her know that we dont have anything at the moment but she can call us back in the morning after Dr Claiborne Billings has reviewed her schedule to see if she can be worked in. She asked for the nurse to call her back today please. Callback [phone]

## 2021-04-09 ENCOUNTER — Other Ambulatory Visit: Payer: Self-pay

## 2021-04-09 ENCOUNTER — Ambulatory Visit (INDEPENDENT_AMBULATORY_CARE_PROVIDER_SITE_OTHER): Payer: PPO | Admitting: Family Medicine

## 2021-04-09 ENCOUNTER — Encounter: Payer: Self-pay | Admitting: Family Medicine

## 2021-04-09 VITALS — BP 153/84 | HR 67 | Temp 98.0°F | Ht 62.0 in | Wt 177.0 lb

## 2021-04-09 DIAGNOSIS — B9689 Other specified bacterial agents as the cause of diseases classified elsewhere: Secondary | ICD-10-CM

## 2021-04-09 DIAGNOSIS — J988 Other specified respiratory disorders: Secondary | ICD-10-CM

## 2021-04-09 DIAGNOSIS — R051 Acute cough: Secondary | ICD-10-CM | POA: Diagnosis not present

## 2021-04-09 MED ORDER — AMOXICILLIN 400 MG/5ML PO SUSR: 800.0000 mg | Freq: Two times a day (BID) | ORAL | 0 refills | Status: AC

## 2021-04-09 MED ORDER — FLUTICASONE PROPIONATE 50 MCG/ACT NA SUSP: 2.0000 | Freq: Two times a day (BID) | NASAL | 6 refills | Status: DC

## 2021-04-09 MED ORDER — BENZONATATE 100 MG PO CAPS: 100.0000 mg | ORAL_CAPSULE | Freq: Two times a day (BID) | ORAL | 0 refills | Status: DC | PRN

## 2021-04-09 NOTE — Progress Notes (Signed)
This visit occurred during the SARS-CoV-2 public health emergency.  Safety protocols were in place, including screening questions prior to the visit, additional usage of staff PPE, and extensive cleaning of exam room while observing appropriate contact time as indicated for disinfecting solutions.    Maria Watkins , 06-Sep-1955, 65 y.o., female MRN: [id number] Patient Care Team    Relationship Specialty Notifications Start End  Natalia Leatherwood, DO PCP - General Family Medicine  10/27/18   Charna Elizabeth, MD Consulting Physician Gastroenterology  01/18/18   Marlow Baars, MD Consulting Physician Obstetrics  01/18/18   Jill Side, OD Referring Physician   04/09/21     Chief Complaint  Patient presents with   Cough    Pt c/o productive cough with greenish tint, sore throat, ear fullness, nasal drainage, HA x 1.5 weeks;      Subjective: Pt presents for an OV with complaints of cough, runny nose, ear fullness, nasal drainage, sinus headache of 10 days duration. She has tried OTC products and claritin. She reports she has rubbed her nose raw with all the drainage.She denies fever, chills, n/v/d, or shortness of breath. Covid test was negative  at home.   Depression screen [facility] 2/9 04/09/2021 01/20/2021 01/19/2021 07/25/2020 07/28/2018  Decreased Interest 0 0 0 0 0  Down, Depressed, Hopeless 0 0 0 0 0  PHQ - 2 Score 0 0 0 0 0    Allergies  Allergen Reactions   Terconazole Nausea Only   Social History   Social History Narrative   Not on file   Past Medical History:  Diagnosis Date   Abnormal Pap smear of cervix 1980's   resolved post cryo with normal follow up   Depression    Elevated alkaline phosphatase level 05/15/2019   Estrogen deficiency 05/15/2019   Gallstones 08/04/2012   GERD (gastroesophageal reflux disease) 11/09/2019   Hypertension    Lymphadenopathy 08/02/2019   Type 2 diabetes mellitus without complication, without long-term current use of insulin (HCC)  01/18/2018   Past Surgical History:  Procedure Laterality Date   CERVIX LESION DESTRUCTION  1980s   CHOLECYSTECTOMY N/A 01/13/2021   Procedure: LAPAROSCOPIC CHOLECYSTECTOMY WITH INTRAOPERATIVE CHOLANGIOGRAM;  Surgeon: Griselda Miner, MD;  Location: [facility] OR;  Service: General;  Laterality: N/A;   COLPOSCOPY  1980's   Family History  Problem Relation Age of Onset   Diabetes Mother    Arthritis Mother    COPD Father    Asthma Father    Heart disease Father    Diabetes Sister    Asthma Brother    COPD Sister    Rheum arthritis Sister    Pulmonary fibrosis Sister        Passed away 04-19-20  Breast cancer Maternal Aunt    Alcohol abuse Son    Allergies as of 04/09/2021       Reactions   Terconazole Nausea Only        Medication List        Accurate as of April 09, 2021  1:17 PM. If you have any questions, ask your nurse or doctor.          azelastine 0.1 % nasal spray Commonly known as: ASTELIN Place 2 sprays into both nostrils 2 (two) times daily. Use in each nostril as directed   cholecalciferol 25 MCG (1000 UNIT) tablet Commonly known as: VITAMIN D3 Take 1,000 Units by mouth daily.   fluticasone 50 MCG/ACT nasal spray Commonly known as:  FLONASE Place 2 sprays into both nostrils daily. What changed:  when to take this reasons to take this   HYDROcodone-acetaminophen 5-325 MG tablet Commonly known as: NORCO/VICODIN Take 1 tablet by mouth every 6 (six) hours as needed for moderate pain or severe pain.   lisinopril 40 MG tablet Commonly known as: ZESTRIL Take 1 tablet (40 mg total) by mouth daily.   Loratadine 10 MG Chew Chew 10 mg by mouth daily as needed for allergies.   metFORMIN 1000 MG tablet Commonly known as: GLUCOPHAGE Take 1 tablet (1,000 mg total) by mouth daily with breakfast.   vitamin B-12 1000 MCG tablet Commonly known as: CYANOCOBALAMIN Take 1,000 mcg by mouth daily.        All past medical history, surgical history,  allergies, family history, immunizations andmedications were updated in the EMR today and reviewed under the history and medication portions of their EMR.     ROS Negative, with the exception of above mentioned in HPI  Objective:  BP (!) 153/84    Pulse 67    Temp 98 F (36.7 C) (Oral)    Ht 5\' 2"  (1.575 m)    Wt 177 lb (80.3 kg)    LMP 04/27/2005 (Approximate)    SpO2 99%    BMI 32.37 kg/m  Body mass index is 32.37 kg/m.  Physical Exam Constitutional:      General: She is not in acute distress.    Appearance: She is ill-appearing. She is not toxic-appearing.  HENT:     Head: Normocephalic and atraumatic.     Right Ear: Tympanic membrane and ear canal normal.     Left Ear: Tympanic membrane and ear canal normal.     Nose: Congestion and rhinorrhea present.     Mouth/Throat:     Mouth: Mucous membranes are dry.     Pharynx: Oropharynx is clear. No oropharyngeal exudate or posterior oropharyngeal erythema.  Eyes:     General:        Right eye: No discharge.        Left eye: No discharge.     Extraocular Movements: Extraocular movements intact.     Conjunctiva/sclera: Conjunctivae normal.     Pupils: Pupils are equal, round, and reactive to light.  Cardiovascular:     Rate and Rhythm: Normal rate and regular rhythm.  Pulmonary:     Effort: Pulmonary effort is normal. No respiratory distress.     Breath sounds: Normal breath sounds. No stridor. No wheezing, rhonchi or rales.  Abdominal:     Palpations: Mass: couhg.  Musculoskeletal:     Cervical back: Neck supple.  Lymphadenopathy:     Cervical: Cervical adenopathy present.  Skin:    General: Skin is warm and dry.     Findings: Rash (opening of bilateral nare are red and dry) present.  Neurological:     General: No focal deficit present.     Mental Status: She is alert and oriented to person, place, and time.  Psychiatric:        Mood and Affect: Mood normal.        Behavior: Behavior normal.        Thought Content:  Thought content normal.        Judgment: Judgment normal.    No results found. No results found. No results found for this or any previous visit (from the past 24 hour(s)).  Assessment/Plan: EPIPHANY SELTZER is a 65 y.o. female present for OV for  Bacterial respiratory infection/Acute  cough Rest, hydrate.  Start  flonase, mucinex (DM if cough), nettie pot or nasal saline.  Amox liquid prescribed, take until completed.  Apply BB lightly to nares twice daily before flonase use.  Tessalon perles for cough.  If cough present it can last up to 6-8 weeks.  F/U 2 weeks if not improved.    Reviewed expectations re: course of current medical issues. Discussed self-management of symptoms. Outlined signs and symptoms indicating need for more acute intervention. Patient verbalized understanding and all questions were answered. Patient received an After-Visit Summary.    No orders of the defined types were placed in this encounter.  No orders of the defined types were placed in this encounter.  Referral Orders  No referral(s) requested today     Note is dictated utilizing voice recognition software. Although note has been proof read prior to signing, occasional typographical errors still can be missed. If any questions arise, please do not hesitate to call for verification.   electronically signed by:  Felix Pacini, DO  [facility] - [place]

## 2021-04-09 NOTE — Telephone Encounter (Signed)
Pt scheduled for 1pm today.

## 2021-04-09 NOTE — Patient Instructions (Addendum)
°  Start Mucinex DM Start tessalon perles for cough Amoxicillin liquid called in for you.  Flonase nasal spray twice a day.   Bag balm application to your nose a few times a day.    Cough can last a few weeks, even if not infectious.

## 2021-04-25 ENCOUNTER — Other Ambulatory Visit: Payer: Self-pay

## 2021-04-25 ENCOUNTER — Encounter: Payer: Self-pay | Admitting: Family Medicine

## 2021-04-25 ENCOUNTER — Ambulatory Visit (INDEPENDENT_AMBULATORY_CARE_PROVIDER_SITE_OTHER): Payer: PPO | Admitting: Family Medicine

## 2021-04-25 VITALS — BP 139/72 | HR 54 | Temp 97.9°F | Ht 62.0 in | Wt 177.0 lb

## 2021-04-25 DIAGNOSIS — R011 Cardiac murmur, unspecified: Secondary | ICD-10-CM

## 2021-04-25 DIAGNOSIS — R002 Palpitations: Secondary | ICD-10-CM

## 2021-04-25 DIAGNOSIS — E119 Type 2 diabetes mellitus without complications: Secondary | ICD-10-CM

## 2021-04-25 DIAGNOSIS — I10 Essential (primary) hypertension: Secondary | ICD-10-CM | POA: Diagnosis not present

## 2021-04-25 DIAGNOSIS — M85852 Other specified disorders of bone density and structure, left thigh: Secondary | ICD-10-CM

## 2021-04-25 DIAGNOSIS — E118 Type 2 diabetes mellitus with unspecified complications: Secondary | ICD-10-CM | POA: Diagnosis not present

## 2021-04-25 DIAGNOSIS — E559 Vitamin D deficiency, unspecified: Secondary | ICD-10-CM | POA: Diagnosis not present

## 2021-04-25 DIAGNOSIS — I5189 Other ill-defined heart diseases: Secondary | ICD-10-CM

## 2021-04-25 DIAGNOSIS — E7800 Pure hypercholesterolemia, unspecified: Secondary | ICD-10-CM

## 2021-04-25 LAB — POCT GLYCOSYLATED HEMOGLOBIN (HGB A1C)
HbA1c POC (<> result, manual entry): 6.9 % (ref 4.0–5.6)
HbA1c, POC (controlled diabetic range): 6.9 % (ref 0.0–7.0)
HbA1c, POC (prediabetic range): 6.9 % — AB (ref 5.7–6.4)
Hemoglobin A1C: 6.9 % — AB (ref 4.0–5.6)

## 2021-04-25 MED ORDER — METFORMIN HCL 1000 MG PO TABS: 1000.0000 mg | ORAL_TABLET | Freq: Every day | ORAL | 1 refills | Status: DC

## 2021-04-25 MED ORDER — LISINOPRIL 40 MG PO TABS: 40.0000 mg | ORAL_TABLET | Freq: Every day | ORAL | 1 refills | Status: DC

## 2021-04-25 NOTE — Patient Instructions (Signed)
° ° °  Next appt 4 months for you rphyscial with fasting labs.

## 2021-04-25 NOTE — Progress Notes (Signed)
This visit occurred during the SARS-CoV-2 public health emergency.  Safety protocols were in place, including screening questions prior to the visit, additional usage of staff PPE, and extensive cleaning of exam room while observing appropriate contact time as indicated for disinfecting solutions.    Patient ID: Maria Watkins, female  DOB: 07-17-1955, 65 y.o.   MRN: [id number] Patient Care Team    Relationship Specialty Notifications Start End  Natalia Leatherwood, DO PCP - General Family Medicine  10/27/18   Charna Elizabeth, MD Consulting Physician Gastroenterology  01/18/18   Marlow Baars, MD Consulting Physician Obstetrics  01/18/18   Jill Side, OD Referring Physician   04/09/21     Chief Complaint  Patient presents with   Diabetes    [facility]; pt is fasting    Subjective: Maria Watkins is a 65 y.o.  Female  present for  Essential hypertension/morbid obesity/palpitations/HLD/valve regurg/enlarged aorta Pt reports compliance with lisinopril 40 mg daily. She was started on hctz 25 mg qd last appt and reports it was making her feel more fatigued.Patient denies chest pain, shortness of breath, dizziness or lower extremity edema.  She is now established with cardio.  Diet: Regular Exercise: Routine exercise RF: Hypertension, diabetes, obesity, former smoker, family history of heart disease  diabetes:compliance with metformin 1000 mg qd. SHe does not check her blood sugars at home.  Patient denies dizziness, hyperglycemic or hypoglycemic events. Patient denies numbness, tingling in the extremities or nonhealing wounds of feet.   Seasonal allergies/seasonal allergies: Patient reports her allergies have been doing well on new regimen.  Regimen was changed to Zyrtec, Flonase, Astelin nasal spray and she reports much improved symptoms.  Did not discuss this appointment. Prior note: Pt presents for an OV with complaints of nasal congestion of 2 days duration.  Associated symptoms include waking  up with congestion that gets better later in the day. Congestion when working in the yard.  She states she did cough up a small amount of yellow phlegm today.  Otherwise denies cough. She was treated for a sinus infection 09/17/2018 with amox x 10 days. She reports she has been taking claritin and Astelin nasal spray as well.  She endorses relief with her symptoms after completion of amoxicillin.  At that time she was having some sinus pain and pressure which has resolved with antibiotics.  He denies any fever, chills, nausea, vomit, sinus pain or teeth pain.    Depression screen [facility] 2/9 04/09/2021 01/20/2021 01/19/2021 07/25/2020 07/28/2018  Decreased Interest 0 0 0 0 0  Down, Depressed, Hopeless 0 0 0 0 0  PHQ - 2 Score 0 0 0 0 0   No flowsheet data found.   Immunization History  Administered Date(s) Administered   Fluad Quad(high Dose 65+) 01/16/2019, 03/19/2021   Influenza Inj Mdck Quad With Preservative 12/27/2018   Influenza, High Dose Seasonal PF 01/16/2019, 02/20/2020   Influenza,inj,Quad PF,6+ Mos 01/18/2018   Moderna Sars-Covid-2 Vaccination 06/08/2019, 07/07/2019   Pneumococcal Conjugate-13 02/23/2020   Pneumococcal Polysaccharide-23 04/14/2018   Tdap 04/27/2013   Past Medical History:  Diagnosis Date   Abnormal Pap smear of cervix 1980's   resolved post cryo with normal follow up   Depression    Elevated alkaline phosphatase level 05/15/2019   Estrogen deficiency 05/15/2019   Gallstones 08/04/2012   GERD (gastroesophageal reflux disease) 11/09/2019   Hypertension    Lymphadenopathy 08/02/2019   Type 2 diabetes mellitus without complication, without long-term current use of insulin (HCC) 01/18/2018  Allergies  Allergen Reactions   Terconazole Nausea Only   Past Surgical History:  Procedure Laterality Date   CERVIX LESION DESTRUCTION  1980s   CHOLECYSTECTOMY N/A 01/13/2021   Procedure: LAPAROSCOPIC CHOLECYSTECTOMY WITH INTRAOPERATIVE CHOLANGIOGRAM;  Surgeon: Griselda Miner, MD;  Location: [facility] OR;  Service: General;  Laterality: N/A;   COLPOSCOPY  1980's   Family History  Problem Relation Age of Onset   Diabetes Mother    Arthritis Mother    COPD Father    Asthma Father    Heart disease Father    Diabetes Sister    Asthma Brother    COPD Sister    Rheum arthritis Sister    Pulmonary fibrosis Sister        Passed away 10-May-2020  Breast cancer Maternal Aunt    Alcohol abuse Son    Social History   Social History Narrative   Not on file    Allergies as of 04/25/2021       Reactions   Terconazole Nausea Only        Medication List        Accurate as of April 25, 2021  8:33 AM. If you have any questions, ask your nurse or doctor.          STOP taking these medications    benzonatate 100 MG capsule Commonly known as: TESSALON Stopped by: Felix Pacini, DO       TAKE these medications    cholecalciferol 25 MCG (1000 UNIT) tablet Commonly known as: VITAMIN D3 Take 1,000 Units by mouth daily.   fluticasone 50 MCG/ACT nasal spray Commonly known as: FLONASE Place 2 sprays into both nostrils in the morning and at bedtime.   lisinopril 40 MG tablet Commonly known as: ZESTRIL Take 1 tablet (40 mg total) by mouth daily.   Loratadine 10 MG Chew Chew 10 mg by mouth daily as needed for allergies.   metFORMIN 1000 MG tablet Commonly known as: GLUCOPHAGE Take 1 tablet (1,000 mg total) by mouth daily with breakfast.   vitamin B-12 1000 MCG tablet Commonly known as: CYANOCOBALAMIN Take 1,000 mcg by mouth daily.        All past medical history, surgical history, allergies, family history, immunizations andmedications were updated in the EMR today and reviewed under the history and medication portions of their EMR.       ROS: 14 pt review of systems performed and negative (unless mentioned in an HPI)  Objective: BP 139/72    Pulse (!) 54    Temp 97.9 F (36.6 C) (Oral)    Ht 5\' 2"  (1.575 m)    Wt 177 lb (80.3  kg)    LMP 04/27/2005 (Approximate)    SpO2 97%    BMI 32.37 kg/m  Physical Exam Vitals and nursing note reviewed.  Constitutional:      General: She is not in acute distress.    Appearance: Normal appearance. She is not ill-appearing, toxic-appearing or diaphoretic.  HENT:     Head: Normocephalic and atraumatic.     Mouth/Throat:     Mouth: Mucous membranes are moist.  Eyes:     General: No scleral icterus.       Right eye: No discharge.        Left eye: No discharge.     Extraocular Movements: Extraocular movements intact.     Conjunctiva/sclera: Conjunctivae normal.     Pupils: Pupils are equal, round, and reactive to light.  Cardiovascular:  Rate and Rhythm: Normal rate and regular rhythm.     Heart sounds: Murmur heard.  Pulmonary:     Effort: Pulmonary effort is normal. No respiratory distress.     Breath sounds: Normal breath sounds. No wheezing, rhonchi or rales.  Musculoskeletal:     Cervical back: Neck supple. No tenderness.  Lymphadenopathy:     Cervical: No cervical adenopathy.  Skin:    General: Skin is warm and dry.     Coloration: Skin is not jaundiced or pale.     Findings: No erythema or rash.  Neurological:     Mental Status: She is alert and oriented to person, place, and time. Mental status is at baseline.     Motor: No weakness.     Gait: Gait normal.  Psychiatric:        Mood and Affect: Mood normal.        Behavior: Behavior normal.        Thought Content: Thought content normal.        Judgment: Judgment normal.     Results for orders placed or performed in visit on 04/25/21 (from the past 24 hour(s))  POCT HgB A1C     Status: Abnormal   Collection Time: 04/25/21  8:20 AM  Result Value Ref Range   Hemoglobin A1C 6.9 (A) 4.0 - 5.6 %   HbA1c POC (<> result, manual entry) 6.9 4.0 - 5.6 %   HbA1c, POC (prediabetic range) 6.9 (A) 5.7 - 6.4 %   HbA1c, POC (controlled diabetic range) 6.9 0.0 - 7.0 %     Assessment/plan: ZAHRAH SUTHERLIN is a  65 y.o. female present for  Essential/hypertension/obesity/palpitations/murmur/WCS/Diastolic dysfx- mild/regurg/enlarged aorta Stable.  - continue  lisinopril to 40 mg daily  - DC HCTZ 25 mg qd- she will continue to monitor.  - her BP readings fluctuate frequently in response to stress/anxiety etc  - reassured her today, not to worry about BP unless < 100 systolic with symptoms or consistently > 140 systolic.  F/u 5.5 mos  Type 2 diabetes mellitus w/ manifestations At goal < 7, but mildly increasing Continue metformin to 1000 mg qd -She was encouraged to focus on her diet and exercise  PNA series: Pneumonia series completed Flu shot: Up-to-date (recommneded yearly) Foot exam: Completed Eye exam: Today 01/2020 A1c: 6.7>> 6.7>> 6.3>> 6.5> 6.6> 7.3> 6.9>6.4>6.9 A1c today Follow-up 5.5 months      B12 deficiency/vit d def: Continue  1000 mcg of sublingual B12  Continue vit d 4000u recommended.   Seasonal allergic rhinitis due to pollen Allergy symptoms are much better controlled now that she is on Zyrtec, Flonase and Astelin.  Continue current regimen through the summer recommended.     Return in about 15 weeks (around 08/08/2021) for CPE (30 min), [facility] (30 min).   Orders Placed This Encounter  Procedures   POCT HgB A1C    Meds ordered this encounter  Medications   lisinopril (ZESTRIL) 40 MG tablet    Sig: Take 1 tablet (40 mg total) by mouth daily.    Dispense:  90 tablet    Refill:  1   metFORMIN (GLUCOPHAGE) 1000 MG tablet    Sig: Take 1 tablet (1,000 mg total) by mouth daily with breakfast.    Dispense:  90 tablet    Refill:  1     Referral Orders  No referral(s) requested today     Electronically signed by: Felix Pacini, DO [facility] [place]

## 2021-05-05 ENCOUNTER — Ambulatory Visit: Payer: PPO | Admitting: Family Medicine

## 2021-05-06 ENCOUNTER — Ambulatory Visit: Payer: PPO | Admitting: Family Medicine

## 2021-05-07 ENCOUNTER — Other Ambulatory Visit: Payer: Self-pay

## 2021-05-07 ENCOUNTER — Ambulatory Visit (INDEPENDENT_AMBULATORY_CARE_PROVIDER_SITE_OTHER): Payer: PPO | Admitting: Family Medicine

## 2021-05-07 ENCOUNTER — Encounter: Payer: Self-pay | Admitting: Family Medicine

## 2021-05-07 VITALS — BP 167/84 | HR 65 | Temp 98.5°F | Wt 177.6 lb

## 2021-05-07 DIAGNOSIS — M25471 Effusion, right ankle: Secondary | ICD-10-CM

## 2021-05-07 DIAGNOSIS — M79671 Pain in right foot: Secondary | ICD-10-CM | POA: Diagnosis not present

## 2021-05-07 NOTE — Progress Notes (Signed)
This visit occurred during the SARS-CoV-2 public health emergency.  Safety protocols were in place, including screening questions prior to the visit, additional usage of staff PPE, and extensive cleaning of exam room while observing appropriate contact time as indicated for disinfecting solutions.    Maria Watkins , March 16, 1956, 66 y.o., female MRN: [id number] Patient Care Team    Relationship Specialty Notifications Start End  Natalia LeatherwoodKuneff, Liviah Cake A, DO PCP - General Family Medicine  10/27/18   Charna ElizabethMann, Jyothi, MD Consulting Physician Gastroenterology  01/18/18   Marlow Baarslark, Dyanna, MD Consulting Physician Obstetrics  01/18/18   Jill SideBarts, Sarah H, OD Referring Physician   04/09/21     Chief Complaint  Patient presents with   Ankle Pain    Started Tuesday. Inside of ankle.   Joint Swelling     Subjective: Pt presents for an OV with complaints of right ankle pain of 2 days duration.  Patient reports she was working out in the yard a few days prior to onset.  She does not recall an injury or feeling discomfort during her yard work.  She reports that evening it did swell and become mildly painful in the medial aspect of her ankle.  The next day it was swollen from her medial ankle to her large toe.  She states initially it was red and hot, with light touch even being painful.  She has been taking 400 mg of Advil every 6-8 hours over the last 24 hours and has noticed a significant improvement in the swelling.  The redness has resolved and the pain has lessened but is still present. She endorses having discomfort in her right large toe approximately 4 weeks ago.  She was seen at the urgent care and it was x-rayed.  She reports the x-ray was normal.  Redness and pain resolved within a few days. Patient does endorse a higher purine diet the night prior to onset of current symptoms.  She is not on a diuretic.  She does not drink enough water, but she does try to take in more water daily  [facility]  04/09/2021 01/20/2021 01/19/2021 07/25/2020 07/28/2018  Decreased Interest 0 0 0 0 0  Down, Depressed, Hopeless 0 0 0 0 0  PHQ - 2 Score 0 0 0 0 0    Allergies  Allergen Reactions   Terconazole Nausea Only   Social History   Social History Narrative   Not on file   Past Medical History:  Diagnosis Date   Abnormal Pap smear of cervix 1980's   resolved post cryo with normal follow up   Depression    Elevated alkaline phosphatase level 05/15/2019   Estrogen deficiency 05/15/2019   Gallstones 08/04/2012   GERD (gastroesophageal reflux disease) 11/09/2019   Hypertension    Lymphadenopathy 08/02/2019   Type 2 diabetes mellitus without complication, without long-term current use of insulin (HCC) 01/18/2018   Past Surgical History:  Procedure Laterality Date   CERVIX LESION DESTRUCTION  1980s   CHOLECYSTECTOMY N/A 01/13/2021   Procedure: LAPAROSCOPIC CHOLECYSTECTOMY WITH INTRAOPERATIVE CHOLANGIOGRAM;  Surgeon: Griselda Mineroth, Paul III, MD;  Location: [facility] OR;  Service: General;  Laterality: N/A;   COLPOSCOPY  1980's   Family History  Problem Relation Age of Onset   Diabetes Mother    Arthritis Mother    COPD Father    Asthma Father    Heart disease Father    Diabetes Sister    Asthma Brother    COPD Sister  Rheum arthritis Sister    Pulmonary fibrosis Sister        Passed away 04/21/20  Breast cancer Maternal Aunt    Alcohol abuse Son    Allergies as of 05/07/2021       Reactions   Terconazole Nausea Only        Medication List        Accurate as of May 07, 2021  4:58 PM. If you have any questions, ask your nurse or doctor.          cholecalciferol 25 MCG (1000 UNIT) tablet Commonly known as: VITAMIN D3 Take 1,000 Units by mouth daily.   fluticasone 50 MCG/ACT nasal spray Commonly known as: FLONASE Place 2 sprays into both nostrils in the morning and at bedtime.   lisinopril 40 MG tablet Commonly known as: ZESTRIL Take 1 tablet (40 mg total) by mouth  daily.   Loratadine 10 MG Chew Chew 10 mg by mouth daily as needed for allergies.   metFORMIN 1000 MG tablet Commonly known as: GLUCOPHAGE Take 1 tablet (1,000 mg total) by mouth daily with breakfast.   vitamin B-12 1000 MCG tablet Commonly known as: CYANOCOBALAMIN Take 1,000 mcg by mouth daily.        All past medical history, surgical history, allergies, family history, immunizations andmedications were updated in the EMR today and reviewed under the history and medication portions of their EMR.     ROS Negative, with the exception of above mentioned in HPI   Objective:  BP (!) 167/84    Pulse 65    Temp 98.5 F (36.9 C)    Wt 177 lb 9.6 oz (80.6 kg)    LMP 04/27/2005 (Approximate)    SpO2 97%    BMI 32.48 kg/m  Body mass index is 32.48 kg/m.  Physical Exam Vitals and nursing note reviewed.  Constitutional:      General: She is not in acute distress.    Appearance: Normal appearance. She is not ill-appearing or toxic-appearing.  Musculoskeletal:     Right foot: Normal range of motion. Bunion present. No deformity.       Feet:  Feet:     Right foot:     Skin integrity: Dry skin present. No ulcer, blister, skin breakdown, erythema or warmth.     Comments: Pain and swelling present medial right ankle to PIP of large toe.  Mild swelling present lateral ankle as well-not tender.  Neurological:     Mental Status: She is alert.     No results found. No results found. No results found for this or any previous visit (from the past 24 hour(s)).  Assessment/Plan: DANIQUE HARTSOUGH is a 66 y.o. female present for OV for  Right foot pain/ Right ankle swelling Gust differential diagnosis of ligament strain, gout or stress fracture.  She had symptoms that were mild in the large toe only approximately 4 weeks ago, could have been a sign of her first gout attack versus a possible stress fracture that was not seen on original x-ray.  Symptoms seem to recur after her yard work  versus high purine meal that was also consumed that day.  Will obtain uric acid level today rule out gout.  Ordered x-ray, if original symptoms 4 weeks ago were stress fracture it should be apparent on x-ray today. Structured her to hydrate well and can continue taking the ibuprofen as she has on a full stomach, for pain and swelling. - Uric acid - DG  Foot Complete Right; Future - DG Ankle Complete Right; Future   Reviewed expectations re: course of current medical issues. Discussed self-management of symptoms. Outlined signs and symptoms indicating need for more acute intervention. Patient verbalized understanding and all questions were answered. Patient received an After-Visit Summary.    Orders Placed This Encounter  Procedures   DG Foot Complete Right   DG Ankle Complete Right   Uric acid   No orders of the defined types were placed in this encounter.  Referral Orders  No referral(s) requested today     Note is dictated utilizing voice recognition software. Although note has been proof read prior to signing, occasional typographical errors still can be missed. If any questions arise, please do not hesitate to call for verification.   electronically signed by:  Felix Pacini, DO  [facility] - OR

## 2021-05-07 NOTE — Patient Instructions (Addendum)
Stress Fracture A stress fracture is a small break or crack in a bone. A stress fracture can be fully broken (complete) or partially broken (incomplete). The most common sites for stress fractures are the bones in the front of your feet (metatarsals), your heel (calcaneus), and the long bone of your lower leg (tibia). What are the causes? This condition is caused by overuse or repetitive exercise, such as running. It happens when a bone cannot absorb any more shock because the muscles around it are weak. Stress fractures happen most commonly when: You rapidly increase or start a new physical activity. You use shoes that are worn out or do not fit properly. You exercise on a new surface. What increases the risk? You are more likely to develop this condition if: You have a condition that causes weak bones (osteoporosis). You are female. Stress fractures are more likely to occur in women. What are the signs or symptoms? The most common symptom of a stress fracture is feeling pain when you are using or putting weight on the affected part of your body. The pain usually improves when you are resting. Other symptoms may include: Swelling of the affected area. Pain in the area when it is touched. Stress fracture pain usually develops over time. How is this diagnosed? This condition may be diagnosed by: Your symptoms. Your medical history. A physical exam. Imaging tests, such as: X-rays. MRI. Bone scan. How is this treated? Treatment depends on the severity of your stress fracture. It is commonly treated with resting, icing, compression, and elevation (RICE therapy). Treatment may also include: Medicines to reduce inflammation. A cast or a walking shoe. Crutches. Surgery. This is usually only in severe cases. Follow these instructions at home: If you have a cast: Do not put pressure on any part of the cast until it is fully hardened. This may take several hours. Do not stick anything  inside the cast to scratch your skin. Doing that increases your risk of infection. Check the skin around the cast every day. Tell your health care provider about any concerns. You may put lotion on dry skin around the edges of the cast. Do not put lotion on the skin underneath the cast. Keep the cast clean. If the cast is not waterproof: Do not let it get wet. Cover it with a watertight covering when you take a bath or a shower. If you have a walking shoe: Wear the shoe as told by your health care provider. Remove it only as told by your health care provider. Loosen the shoe if your toes tingle, become numb, or turn cold and blue. Keep the shoe clean. If the shoe is not waterproof: Do not let it get wet. Managing pain, stiffness, and swelling  If directed, apply ice to the injured area: If you have a walking shoe, remove the shoe as told by your health care provider. Put ice in a plastic bag. Place a towel between your skin and the bag or between your cast and the bag. Leave the ice on for 20 minutes, 2-3 times per day. Move your toes often to avoid stiffness and to lessen swelling. Raise (elevate) the injured area above the level of your heart while you are sitting or lying down. Activity Rest as directed by your health care provider. Ask your health care provider if you may do alternative exercises, such as swimming or biking, while you are healing. Return to your normal activities as directed by your health care provider. Ask  your health care provider what activities are safe for you. Perform range-of-motion exercises only as directed by your health care provider. General instructions Do not use the injured limb to support your body weight until your health care provider says that you can. Use crutches if your health care provider tells you to do so. Do not use any products that contain nicotine or tobacco, such as cigarettes and e-cigarettes. These can delay bone healing. If you need  help quitting, ask your health care provider. Take over-the-counter and prescription medicines only as told by your health care provider. Keep all follow-up visits as told by your health care provider. This is important. How is this prevented? Only wear shoes that: Fit well. Are not worn out. Eat a healthy diet that contains vitamin D and calcium. This helps keep your bones strong. Good sources of calcium and vitamin D include: Low-fat dairy products such as milk, yogurt, and cheese. Certain fish, such as fresh or canned salmon, tuna, and sardines. Products that have calcium and vitamin D added to them (fortified products), such as fortified cereals or juice. Be careful when you start a new physical activity. Give your body time to adjust. Avoid doing only one kind of activity. Do different exercises, such as swimming and running, so that no single part of your body gets overused. Do strength-training exercises. Contact a health care provider if: Your pain gets worse. You have new symptoms. You have increased swelling. Get help right away if: You lose feeling in the injured area. Summary A stress fracture is a small break or crack in a bone. A stress fracture can be fully broken (complete) or partially broken (incomplete). This condition is caused by overuse or repetitive exercise, such as running. The most common symptom of a stress fracture is feeling pain when you are using or putting weight on the affected part of your body. Treatment depends on the severity of your stress fracture. This information is not intended to replace advice given to you by your health care provider. Make sure you discuss any questions you have with your health care provider. Document Revised: 08/01/2020 Document Reviewed: 08/01/2020 Elsevier Patient Education  [place].   Gout Gout is a condition that causes painful swelling of the joints. Gout is a type of inflammation of the joints (arthritis).  This condition is caused by having too much uric acid in the body. Uric acid is a chemical that forms when the body breaks down substances called purines. Purines are important for building body proteins. When the body has too much uric acid, sharp crystals can form and build up inside the joints. This causes pain and swelling. Gout attacks can happen quickly and may be very painful (acute gout). Over time, the attacks can affect more joints and become more frequent (chronic gout). Gout can also cause uric acid to build up under the skin and inside the kidneys. What are the causes? This condition is caused by too much uric acid in your blood. This can happen because: Your kidneys do not remove enough uric acid from your blood. This is the most common cause. Your body makes too much uric acid. This can happen with some cancers and cancer treatments. It can also occur if your body is breaking down too many red blood cells (hemolytic anemia). You eat too many foods that are high in purines. These foods include organ meats and some seafood. Alcohol, especially beer, is also high in purines. A gout attack may  be triggered by trauma or stress. What increases the risk? You are more likely to develop this condition if you: Have a family history of gout. Are female and middle-aged. Are female and have gone through menopause. Are obese. Frequently drink alcohol, especially beer. Are dehydrated. Lose weight too quickly. Have an organ transplant. Have lead poisoning. Take certain medicines, including aspirin, cyclosporine, diuretics, levodopa, and niacin. Have kidney disease. Have a skin condition called psoriasis. What are the signs or symptoms? An attack of acute gout happens quickly. It usually occurs in just one joint. The most common place is the big toe. Attacks often start at night. Other joints that may be affected include joints of the feet, ankle, knee, fingers, wrist, or elbow. Symptoms of this  condition may include: Severe pain. Warmth. Swelling. Stiffness. Tenderness. The affected joint may be very painful to touch. Shiny, red, or purple skin. Chills and fever. Chronic gout may cause symptoms more frequently. More joints may be involved. You may also have white or yellow lumps (tophi) on your hands or feet or in other areas near your joints. How is this diagnosed? This condition is diagnosed based on your symptoms, medical history, and physical exam. You may have tests, such as: Blood tests to measure uric acid levels. Removal of joint fluid with a thin needle (aspiration) to look for uric acid crystals. X-rays to look for joint damage. How is this treated? Treatment for this condition has two phases: treating an acute attack and preventing future attacks. Acute gout treatment may include medicines to reduce pain and swelling, including: NSAIDs. Steroids. These are strong anti-inflammatory medicines that can be taken by mouth (orally) or injected into a joint. Colchicine. This medicine relieves pain and swelling when it is taken soon after an attack. It can be given by mouth or through an IV. Preventive treatment may include: Daily use of smaller doses of NSAIDs or colchicine. Use of a medicine that reduces uric acid levels in your blood. Changes to your diet. You may need to see a dietitian about what to eat and drink to prevent gout. Follow these instructions at home: During a gout attack  If directed, put ice on the affected area: Put ice in a plastic bag. Place a towel between your skin and the bag. Leave the ice on for 20 minutes, 2-3 times a day. Raise (elevate) the affected joint above the level of your heart as often as possible. Rest the joint as much as possible. If the affected joint is in your leg, you may be given crutches to use. Follow instructions from your health care provider about eating or drinking restrictions. Avoiding future gout attacks Follow a  low-purine diet as told by your dietitian or health care provider. Avoid foods and drinks that are high in purines, including liver, kidney, anchovies, asparagus, herring, mushrooms, mussels, and beer. Maintain a healthy weight or lose weight if you are overweight. If you want to lose weight, talk with your health care provider. It is important that you do not lose weight too quickly. Start or maintain an exercise program as told by your health care provider. Eating and drinking Drink enough fluids to keep your urine pale yellow. If you drink alcohol: Limit how much you use to: 0-1 drink a day for women. 0-2 drinks a day for men. Be aware of how much alcohol is in your drink. In the [place] one drink equals one 12 oz bottle of beer (355 mL) one 5 oz glass of  wine (148 mL), or one 1 oz glass of hard liquor (44 mL). General instructions Take over-the-counter and prescription medicines only as told by your health care provider. Do not drive or use heavy machinery while taking prescription pain medicine. Return to your normal activities as told by your health care provider. Ask your health care provider what activities are safe for you. Keep all follow-up visits as told by your health care provider. This is important. Contact a health care provider if you have: Another gout attack. Continuing symptoms of a gout attack after 10 days of treatment. Side effects from your medicines. Chills or a fever. Burning pain when you urinate. Pain in your lower back or belly. Get help right away if you: Have severe or uncontrolled pain. Cannot urinate. Summary Gout is painful swelling of the joints caused by inflammation. The most common site of pain is the big toe, but it can affect other joints in the body. Medicines and dietary changes can help to prevent and treat gout attacks. This information is not intended to replace advice given to you by your health care provider. Make sure you discuss any  questions you have with your health care provider. Document Revised: 10/22/2017 Document Reviewed: 11/03/2017 Elsevier Patient Education  2022 [place] A low-purine eating plan involves making food choices to limit your intake of purine. Purine is a kind of uric acid. Too much uric acid in your blood can cause certain conditions, such as gout and kidney stones. Eating a low-purine diet can help control these conditions. What are tips for following this plan? Reading food labels Avoid foods with saturated or Trans fat. Check the ingredient list of grains-based foods, such as bread and cereal, to make sure that they contain whole grains. Check the ingredient list of sauces or soups to make sure they do not contain meat or fish. When choosing soft drinks, check the ingredient list to make sure they do not contain high-fructose corn syrup. Shopping  Buy plenty of fresh fruits and vegetables. Avoid buying canned or fresh fish. Buy dairy products labeled as low-fat or nonfat. Avoid buying premade or processed foods. These foods are often high in fat, salt (sodium), and added sugar. Cooking Use olive oil instead of butter when cooking. Oils like olive oil, canola oil, and sunflower oil contain healthy fats. Meal planning Learn which foods do or do not affect you. If you find out that a food tends to cause your gout symptoms to flare up, avoid eating that food. You can enjoy foods that do not cause problems. If you have any questions about a food item, talk with your dietitian or health care provider. Limit foods high in fat, especially saturated fat. Fat makes it harder for your body to get rid of uric acid. Choose foods that are lower in fat and are lean sources of protein. General guidelines Limit alcohol intake to no more than 1 drink a day for nonpregnant women and 2 drinks a day for men. One drink equals 12 oz of beer, 5 oz of wine, or 1 oz of hard liquor.  Alcohol can affect the way your body gets rid of uric acid. Drink plenty of water to keep your urine clear or pale yellow. Fluids can help remove uric acid from your body. If directed by your health care provider, take a vitamin C supplement. Work with your health care provider and dietitian to develop a plan to achieve or maintain a healthy weight.  Losing weight can help reduce uric acid in your blood. What foods are recommended? The items listed may not be a complete list. Talk with your dietitian about what dietary choices are best for you. Foods low in purines Foods low in purines do not need to be limited. These include: All fruits. All low-purine vegetables, pickles, and olives. Breads, pasta, rice, cornbread, and popcorn. Cake and other baked goods. All dairy foods. Eggs, nuts, and nut butters. Spices and condiments, such as salt, herbs, and vinegar. Plant oils, butter, and margarine. Water, sugar-free soft drinks, tea, coffee, and cocoa. Vegetable-based soups, broths, sauces, and gravies. Foods moderate in purines Foods moderate in purines should be limited to the amounts listed.  cup of asparagus, cauliflower, spinach, mushrooms, or green peas, each day. 2/3 cup uncooked oatmeal, each day.  cup dry wheat bran or wheat germ, each day. 2-3 ounces of meat or poultry, each day. 4-6 ounces of shellfish, such as crab, lobster, oysters, or shrimp, each day. 1 cup cooked beans, peas, or lentils, each day. Soup, broths, or bouillon made from meat or fish. Limit these foods as much as possible. What foods are not recommended? The items listed may not be a complete list. Talk with your dietitian about what dietary choices are best for you. Limit your intake of foods high in purines, including: Beer and other alcohol. Meat-based gravy or sauce. Canned or fresh fish, such as: Anchovies, sardines, herring, and tuna. Mussels and scallops. Codfish, trout, and haddock. Berniece Salines. Organ  meats, such as: Liver or kidney. Tripe. Sweetbreads (thymus gland or pancreas). Wild Clinical biochemist. Yeast or yeast extract supplements. Drinks sweetened with high-fructose corn syrup. Summary Eating a low-purine diet can help control conditions caused by too much uric acid in the body, such as gout or kidney stones. Choose low-purine foods, limit alcohol, and limit foods high in fat. You will learn over time which foods do or do not affect you. If you find out that a food tends to cause your gout symptoms to flare up, avoid eating that food. This information is not intended to replace advice given to you by your health care provider. Make sure you discuss any questions you have with your health care provider. Document Revised: 07/27/2019 Document Reviewed: 07/27/2019 Elsevier Patient Education  [place].

## 2021-05-07 NOTE — Progress Notes (Signed)
Also wants to know if advil is okay to use with health conditions. Eye exam scheduled in March

## 2021-05-08 ENCOUNTER — Ambulatory Visit (HOSPITAL_BASED_OUTPATIENT_CLINIC_OR_DEPARTMENT_OTHER)
Admission: RE | Admit: 2021-05-08 | Discharge: 2021-05-08 | Disposition: A | Discharge status: Home / Self Care | Payer: PPO | Source: Ambulatory Visit | Attending: Family Medicine | Admitting: Family Medicine

## 2021-05-08 DIAGNOSIS — M25471 Effusion, right ankle: Secondary | ICD-10-CM | POA: Diagnosis not present

## 2021-05-08 DIAGNOSIS — M79671 Pain in right foot: Secondary | ICD-10-CM | POA: Insufficient documentation

## 2021-05-08 DIAGNOSIS — M19071 Primary osteoarthritis, right ankle and foot: Secondary | ICD-10-CM | POA: Diagnosis not present

## 2021-05-08 LAB — URIC ACID: Uric Acid, Serum: 6.5 mg/dL (ref 2.5–7.0)

## 2021-05-09 ENCOUNTER — Ambulatory Visit (INDEPENDENT_AMBULATORY_CARE_PROVIDER_SITE_OTHER): Payer: PPO

## 2021-05-09 ENCOUNTER — Other Ambulatory Visit: Payer: Self-pay

## 2021-05-09 ENCOUNTER — Telehealth: Payer: Self-pay | Admitting: Family Medicine

## 2021-05-09 DIAGNOSIS — M25471 Effusion, right ankle: Secondary | ICD-10-CM | POA: Diagnosis not present

## 2021-05-09 NOTE — Telephone Encounter (Signed)
Spoke with pt regarding labs and instructions.   

## 2021-05-09 NOTE — Progress Notes (Addendum)
Pt here for ankle brace fitting. Pt received one ankle brace.

## 2021-05-09 NOTE — Telephone Encounter (Signed)
Please call patient: -Her x-ray is read as normal of her foot and ankle.  There are some mild arthritic changes, but that is to be expected. - Her uric acid levels are within normal range.  I cannot rule out it was not an episode of gout, since she was seeing improvement by the time she was seen at the uric acid level of 6.5, but it makes it more unlikely.  I suspect her symptoms are secondary to an issue with the ligaments/tendons around her ankle that extends to the large toe.  There are a few ligaments/tendons that come down the back of the heel, along the inside of the ankle and stretch to the front of the foot.    -Would recommend she continue the Advil for the weekend.  Then only as needed.    -Can she come in for a nurse appointment this afternoon around 345 to be fitted for an ankle brace. I would like her to wear for 3 weeks during all waking hours (except showering).  Please let her know this is not the same thing as what we discussed her purchasing off [employer], this is different, she does not need to purchase the other device.      -Follow-up in 3 weeks with this provider- sooner if worsening    Please charge for ankle brace during nurse visit.

## 2021-05-09 NOTE — Progress Notes (Signed)
Per orders of Dr. Claiborne Billings, pt was seen in clinic for ankle brace fitting. Pt was educated by Harlene Salts, CMA on how to properly care and use brace. Pt voiced understanding. Sw, cma

## 2021-05-29 ENCOUNTER — Other Ambulatory Visit: Payer: Self-pay

## 2021-05-30 ENCOUNTER — Ambulatory Visit: Payer: PPO | Admitting: Family Medicine

## 2021-06-02 ENCOUNTER — Telehealth: Payer: Self-pay | Admitting: Family Medicine

## 2021-06-09 ENCOUNTER — Ambulatory Visit: Payer: PPO | Admitting: Family Medicine

## 2021-06-24 DIAGNOSIS — H2513 Age-related nuclear cataract, bilateral: Secondary | ICD-10-CM | POA: Diagnosis not present

## 2021-06-24 DIAGNOSIS — Z7984 Long term (current) use of oral hypoglycemic drugs: Secondary | ICD-10-CM | POA: Diagnosis not present

## 2021-06-24 DIAGNOSIS — E119 Type 2 diabetes mellitus without complications: Secondary | ICD-10-CM | POA: Diagnosis not present

## 2021-06-24 LAB — HM DIABETES EYE EXAM

## 2021-08-04 NOTE — Telephone Encounter (Signed)
error 

## 2021-08-14 ENCOUNTER — Ambulatory Visit (INDEPENDENT_AMBULATORY_CARE_PROVIDER_SITE_OTHER): Payer: PPO | Admitting: Family Medicine

## 2021-08-14 ENCOUNTER — Encounter: Payer: Self-pay | Admitting: Family Medicine

## 2021-08-14 VITALS — BP 127/73 | HR 58 | Temp 97.6°F | Ht 62.0 in | Wt 178.0 lb

## 2021-08-14 DIAGNOSIS — M85852 Other specified disorders of bone density and structure, left thigh: Secondary | ICD-10-CM | POA: Diagnosis not present

## 2021-08-14 DIAGNOSIS — E119 Type 2 diabetes mellitus without complications: Secondary | ICD-10-CM

## 2021-08-14 DIAGNOSIS — Z1231 Encounter for screening mammogram for malignant neoplasm of breast: Secondary | ICD-10-CM | POA: Diagnosis not present

## 2021-08-14 DIAGNOSIS — Z0000 Encounter for general adult medical examination without abnormal findings: Secondary | ICD-10-CM | POA: Diagnosis not present

## 2021-08-14 DIAGNOSIS — J301 Allergic rhinitis due to pollen: Secondary | ICD-10-CM | POA: Diagnosis not present

## 2021-08-14 DIAGNOSIS — E538 Deficiency of other specified B group vitamins: Secondary | ICD-10-CM | POA: Diagnosis not present

## 2021-08-14 DIAGNOSIS — E559 Vitamin D deficiency, unspecified: Secondary | ICD-10-CM

## 2021-08-14 DIAGNOSIS — I10 Essential (primary) hypertension: Secondary | ICD-10-CM | POA: Diagnosis not present

## 2021-08-14 DIAGNOSIS — E118 Type 2 diabetes mellitus with unspecified complications: Secondary | ICD-10-CM | POA: Diagnosis not present

## 2021-08-14 LAB — LIPID PANEL
Cholesterol: 211 mg/dL — ABNORMAL HIGH (ref 0–200)
HDL: 51.3 mg/dL (ref >39.00)
LDL Cholesterol: 124 mg/dL — ABNORMAL HIGH (ref 0–99)
NonHDL: 159.35
Total CHOL/HDL Ratio: 4
Triglycerides: 177 mg/dL — ABNORMAL HIGH (ref 0.0–149.0)
VLDL: 35.4 mg/dL (ref 0.0–40.0)

## 2021-08-14 LAB — COMPREHENSIVE METABOLIC PANEL
ALT: 14 U/L (ref 0–35)
AST: 16 U/L (ref 0–37)
Albumin: 4.5 g/dL (ref 3.5–5.2)
Alkaline Phosphatase: 129 U/L — ABNORMAL HIGH (ref 39–117)
BUN: 13 mg/dL (ref 6–23)
CO2: 30 mEq/L (ref 19–32)
Calcium: 9.4 mg/dL (ref 8.4–10.5)
Chloride: 102 mEq/L (ref 96–112)
Creatinine, Ser: 0.8 mg/dL (ref 0.40–1.20)
GFR: 76.05 mL/min (ref >60.00)
Glucose, Bld: 161 mg/dL — ABNORMAL HIGH (ref 70–99)
Potassium: 4.1 mEq/L (ref 3.5–5.1)
Sodium: 138 mEq/L (ref 135–145)
Total Bilirubin: 1.5 mg/dL — ABNORMAL HIGH (ref 0.2–1.2)
Total Protein: 7.7 g/dL (ref 6.0–8.3)

## 2021-08-14 LAB — MICROALBUMIN / CREATININE URINE RATIO
Creatinine,U: 169.3 mg/dL
Microalb Creat Ratio: 3.2 mg/g (ref 0.0–30.0)
Microalb, Ur: 5.4 mg/dL — ABNORMAL HIGH (ref 0.0–1.9)

## 2021-08-14 LAB — CBC
HCT: 40.2 % (ref 36.0–46.0)
Hemoglobin: 13.7 g/dL (ref 12.0–15.0)
MCHC: 34.2 g/dL (ref 30.0–36.0)
MCV: 86.7 fl (ref 78.0–100.0)
Platelets: 136 10*3/uL — ABNORMAL LOW (ref 150.0–400.0)
RBC: 4.63 Mil/uL (ref 3.87–5.11)
RDW: 13.7 % (ref 11.5–15.5)
WBC: 5.9 10*3/uL (ref 4.0–10.5)

## 2021-08-14 LAB — TSH: TSH: 1.4 u[IU]/mL (ref 0.35–5.50)

## 2021-08-14 LAB — HEMOGLOBIN A1C: Hgb A1c MFr Bld: 7.3 % — ABNORMAL HIGH (ref 4.6–6.5)

## 2021-08-14 LAB — VITAMIN B12: Vitamin B-12: 175 pg/mL — ABNORMAL LOW (ref 211–911)

## 2021-08-14 LAB — VITAMIN D 25 HYDROXY (VIT D DEFICIENCY, FRACTURES): VITD: 30.25 ng/mL (ref 30.00–100.00)

## 2021-08-14 MED ORDER — LISINOPRIL 40 MG PO TABS: 40.0000 mg | ORAL_TABLET | Freq: Every day | ORAL | 1 refills | Status: DC

## 2021-08-14 MED ORDER — FLUTICASONE PROPIONATE 50 MCG/ACT NA SUSP: 2.0000 | Freq: Two times a day (BID) | NASAL | 6 refills | Status: DC

## 2021-08-14 MED ORDER — METFORMIN HCL 1000 MG PO TABS: 1000.0000 mg | ORAL_TABLET | Freq: Every day | ORAL | 1 refills | Status: DC

## 2021-08-14 NOTE — Progress Notes (Signed)
? ?This visit occurred during the SARS-CoV-2 public health emergency.  Safety protocols were in place, including screening questions prior to the visit, additional usage of staff PPE, and extensive cleaning of exam room while observing appropriate contact time as indicated for disinfecting solutions.  ? ? ?Patient ID: Maria Watkins, female  DOB: [birth year], 66 y.o.   MRN: [id number] ?Patient Care Team  ?  Relationship Specialty Notifications Start End  ?Ma Hillock, DO PCP - General Family Medicine  10/27/18   ?Juanita Craver, MD Consulting Physician Gastroenterology  01/18/18   ?Jerelyn Charles, MD Consulting Physician Obstetrics  01/18/18   ?Otelia Sergeant, OD Referring Physician   04/09/21   ? ? ?Chief Complaint  ?Patient presents with  ? Annual Exam  ?  Pt is fasting  ? ? ?Subjective: ?Maria Watkins is a 66 y.o.  Female  present for CPE/cmc   ?All past medical history, surgical history, allergies, family history, immunizations, medications and social history were updated in the electronic medical record today. ?All recent labs, ED visits and hospitalizations within the last year were reviewed. ? ?Health maintenance:  ?Colonoscopy: completed 08/27/2014, by Dr. Collene Mares. follow up 10 yr. ?Mammogram: completed:02/21/2021 - Dr. Carlis Abbott ?Cervical cancer screening: n/I > 65 ?Immunizations: tdap UTD 2015, Influenza 02/2021 (encouraged yearly), PNA series 19/21, shingrix still deciding. , covid completed ?Infectious disease screening: Hep C completed 2017 ?DEXA: last completed 05/31/2019, result osteopenia ?Assistive device: none ?Oxygen [id number] ?Patient has a Dental home. ?Hospitalizations/ED visits: reviewed ? ?Essential hypertension/morbid obesity/palpitations/HLD/valve regurg/enlarged aorta ?Pt reports compliance with lisinopril 40 mg daily .Patient denies chest pain, shortness of breath, dizziness or lower extremity edema.  ?She is now established with cardio.  ?Diet: Regular ?Exercise: Routine exercise ?RF: Hypertension,  diabetes, obesity, former smoker, family history of heart disease ?  ?diabetes:compliance  with metformin 1000 mg qd. Patient denies dizziness, hyperglycemic or hypoglycemic events. Patient denies numbness, tingling in the extremities or nonhealing wounds of feet.  ?Seasonal allergies/seasonal allergies: ?Patient reports her allergies have been doing well on new regimen.  Regimen was changed to Zyrtec, Flonase, Astelin nasal spray and she reports much improved symptoms.   ?Prior note: ?Pt presents for an OV with complaints of nasal congestion of 2 days duration.  Associated symptoms include waking up with congestion that gets better later in the day. Congestion when working in the yard.  She states she did cough up a small amount of yellow phlegm today.  Otherwise denies cough. ?She was treated for a sinus infection 09/17/2018 with amox x 10 days. She reports she has been taking claritin and Astelin nasal spray as well.  She endorses relief with her symptoms after completion of amoxicillin.  At that time she was having some sinus pain and pressure which has resolved with antibiotics.  He denies any fever, chills, nausea, vomit, sinus pain or teeth pain.  ? ? ?  04/09/2021  ?  1:05 PM 01/20/2021  ?  5:48 PM 01/19/2021  ? 10:13 AM 07/25/2020  ?  1:30 PM 07/28/2018  ?  8:09 AM  ?Depression screen PHQ 2/9  ?Decreased Interest 0 0 0 0 0  ?Down, Depressed, Hopeless 0 0 0 0 0  ?PHQ - 2 Score 0 0 0 0 0  ? ?   ? View : No data to display.  ?  ?  ?  ? ? ? ?Immunization History  ?Administered Date(s) Administered  ? Fluad Quad(high Dose 65+) 01/16/2019, 03/19/2021  ? Influenza Inj Mdck Quad  With Preservative 12/27/2018  ? Influenza, High Dose Seasonal PF 01/16/2019, 02/20/2020  ? Influenza,inj,Quad PF,6+ Mos 01/18/2018  ? Moderna Sars-Covid-2 Vaccination 06/08/2019, 07/07/2019  ? Pneumococcal Conjugate-13 02/23/2020  ? Pneumococcal Polysaccharide-23 04/14/2018  ? Tdap 04/27/2013  ? ? ? ?Past Medical History:  ?Diagnosis Date  ?  Abnormal Pap smear of cervix 1980's  ? resolved post cryo with normal follow up  ? Depression   ? Elevated alkaline phosphatase level 05/15/2019  ? Estrogen deficiency 05/15/2019  ? Gallstones 08/04/2012  ? GERD (gastroesophageal reflux disease) 11/09/2019  ? Hypertension   ? Lymphadenopathy 08/02/2019  ? Type 2 diabetes mellitus without complication, without long-term current use of insulin ([place]) 01/18/2018  ? ?Allergies  ?Allergen Reactions  ? Terconazole Nausea Only  ? ?Past Surgical History:  ?Procedure Laterality Date  ? CERVIX LESION DESTRUCTION  1980s  ? CHOLECYSTECTOMY N/A 01/13/2021  ? Procedure: LAPAROSCOPIC CHOLECYSTECTOMY WITH INTRAOPERATIVE CHOLANGIOGRAM;  Surgeon: Jovita Kussmaul, MD;  Location: [facility];  Service: General;  Laterality: N/A;  ? COLPOSCOPY  1980's  ? ?Family History  ?Problem Relation Age of Onset  ? Diabetes Mother   ? Arthritis Mother   ? COPD Father   ? Asthma Father   ? Heart disease Father   ? Diabetes Sister   ? Asthma Brother   ? COPD Sister   ? Rheum arthritis Sister   ? Pulmonary fibrosis Sister   ?     Passed away 2020-05-01  ? Breast cancer Maternal Aunt   ? Alcohol abuse Son   ? ?Social History  ? ?Social History Narrative  ? Not on file  ? ? ?Allergies as of 08/14/2021   ? ?   Reactions  ? Terconazole Nausea Only  ? ?  ? ?  ?Medication List  ?  ? ?  ? Accurate as of August 14, 2021  8:26 AM. If you have any questions, ask your nurse or doctor.  ?  ?  ? ?  ? ?cholecalciferol 25 MCG (1000 UNIT) tablet ?Commonly known as: VITAMIN D3 ?Take 1,000 Units by mouth daily. ?  ?fluticasone 50 MCG/ACT nasal spray ?Commonly known as: FLONASE ?Place 2 sprays into both nostrils in the morning and at bedtime. ?  ?lisinopril 40 MG tablet ?Commonly known as: ZESTRIL ?Take 1 tablet (40 mg total) by mouth daily. ?  ?Loratadine 10 MG Chew ?Chew 10 mg by mouth daily as needed for allergies. ?  ?metFORMIN 1000 MG tablet ?Commonly known as: GLUCOPHAGE ?Take 1 tablet (1,000 mg total) by mouth daily  with breakfast. ?  ?vitamin B-12 1000 MCG tablet ?Commonly known as: CYANOCOBALAMIN ?Take 1,000 mcg by mouth daily. ?  ? ?  ? ? ?All past medical history, surgical history, allergies, family history, immunizations andmedications were updated in the EMR today and reviewed under the history and medication portions of their EMR.    ? ?Recent Results (from the past 2160 hour(s))  ?[facility] DIABETES EYE EXAM     Status: None  ? Collection Time: 06/24/21 12:00 AM  ?Result Value Ref Range  ? [facility] Diabetic Eye Exam No Retinopathy No Retinopathy  ? ? ?DG Ankle Complete Right/DG Foot Complete Right ?Result Date: 05/08/2021 ?IMPRESSION: 1. No acute fracture or dislocation of the right foot/right ankle. 2. Mild degenerative changes at the first metatarsophalangeal joint. Electronically Signed   By: Ronney Asters M.D.   On: 05/08/2021 18:05  ? ? ? ?ROS ?14 pt review of systems performed and negative (unless mentioned in an  HPI) ? ?Objective: ?BP 127/73   Pulse (!) 58   Temp 97.6 ?F (36.4 ?C) (Oral)   Ht 5\' 2"  (1.575 m)   Wt 178 lb (80.7 kg)   LMP 04/27/2005 (Approximate)   SpO2 100%   BMI 32.56 kg/m?  ?Physical Exam ?Vitals and nursing note reviewed.  ?Constitutional:   ?   General: She is not in acute distress. ?   Appearance: Normal appearance. She is obese. She is not ill-appearing or toxic-appearing.  ?HENT:  ?   Head: Normocephalic and atraumatic.  ?   Right Ear: Tympanic membrane, ear canal and external ear normal. There is no impacted cerumen.  ?   Left Ear: Tympanic membrane, ear canal and external ear normal. There is no impacted cerumen.  ?   Nose: No congestion or rhinorrhea.  ?   Mouth/Throat:  ?   Mouth: Mucous membranes are moist.  ?   Pharynx: Oropharynx is clear. No oropharyngeal exudate or posterior oropharyngeal erythema.  ?Eyes:  ?   General: No scleral icterus.    ?   Right eye: No discharge.     ?   Left eye: No discharge.  ?   Extraocular Movements: Extraocular movements intact.  ?   Conjunctiva/sclera:  Conjunctivae normal.  ?   Pupils: Pupils are equal, round, and reactive to light.  ?Cardiovascular:  ?   Rate and Rhythm: Normal rate and regular rhythm.  ?   Pulses: Normal pulses.  ?   Heart sounds: Normal heart sound

## 2021-08-14 NOTE — Patient Instructions (Addendum)
Return in about 24 weeks (around 01/29/2022) for Routine chronic condition follow-up. ? ? ?Great to see you today.  ?I have refilled the medication(s) we provide.  ? ?If labs were collected, we will inform you of lab results once received either by echart message or telephone call.  ? - echart message- for normal results that have been seen by the patient already.  ? - telephone call: abnormal results or if patient has not viewed results in their echart. ? ? ?Health Maintenance, Female ?Adopting a healthy lifestyle and getting preventive care are important in promoting health and wellness. Ask your health care provider about: ?The right schedule for you to have regular tests and exams. ?Things you can do on your own to prevent diseases and keep yourself healthy. ?What should I know about diet, weight, and exercise? ?Eat a healthy diet ? ?Eat a diet that includes plenty of vegetables, fruits, low-fat dairy products, and lean protein. ?Do not eat a lot of foods that are high in solid fats, added sugars, or sodium. ?Maintain a healthy weight ?Body mass index (BMI) is used to identify weight problems. It estimates body fat based on height and weight. Your health care provider can help determine your BMI and help you achieve or maintain a healthy weight. ?Get regular exercise ?Get regular exercise. This is one of the most important things you can do for your health. Most adults should: ?Exercise for at least 150 minutes each week. The exercise should increase your heart rate and make you sweat (moderate-intensity exercise). ?Do strengthening exercises at least twice a week. This is in addition to the moderate-intensity exercise. ?Spend less time sitting. Even light physical activity can be beneficial. ?Watch cholesterol and blood lipids ?Have your blood tested for lipids and cholesterol at 68 years of age, then have this test every 5 years. ?Have your cholesterol levels checked more often if: ?Your lipid or cholesterol  levels are high. ?You are older than 68 years of age. ?You are at high risk for heart disease. ?What should I know about cancer screening? ?Depending on your health history and family history, you may need to have cancer screening at various ages. This may include screening for: ?Breast cancer. ?Cervical cancer. ?Colorectal cancer. ?Skin cancer. ?Lung cancer. ?What should I know about heart disease, diabetes, and high blood pressure? ?Blood pressure and heart disease ?High blood pressure causes heart disease and increases the risk of stroke. This is more likely to develop in people who have high blood pressure readings or are overweight. ?Have your blood pressure checked: ?Every 3-5 years if you are 73-58 years of age. ?Every year if you are 22 years old or older. ?Diabetes ?Have regular diabetes screenings. This checks your fasting blood sugar level. Have the screening done: ?Once every three years after age 1 if you are at a normal weight and have a low risk for diabetes. ?More often and at a younger age if you are overweight or have a high risk for diabetes. ?What should I know about preventing infection? ?Hepatitis B ?If you have a higher risk for hepatitis B, you should be screened for this virus. Talk with your health care provider to find out if you are at risk for hepatitis B infection. ?Hepatitis C ?Testing is recommended for: ?Everyone born from [birth year] through 1967. ?Anyone with known risk factors for hepatitis C. ?Sexually transmitted infections (STIs) ?Get screened for STIs, including gonorrhea and chlamydia, if: ?You are sexually active and are younger than 68 years  of age. ?You are older than 68 years of age and your health care provider tells you that you are at risk for this type of infection. ?Your sexual activity has changed since you were last screened, and you are at increased risk for chlamydia or gonorrhea. Ask your health care provider if you are at risk. ?Ask your health care provider about  whether you are at high risk for HIV. Your health care provider may recommend a prescription medicine to help prevent HIV infection. If you choose to take medicine to prevent HIV, you should first get tested for HIV. You should then be tested every 3 months for as long as you are taking the medicine. ?Pregnancy ?If you are about to stop having your period (premenopausal) and you may become pregnant, seek counseling before you get pregnant. ?Take 400 to 800 micrograms (mcg) of folic acid every day if you become pregnant. ?Ask for birth control (contraception) if you want to prevent pregnancy. ?Osteoporosis and menopause ?Osteoporosis is a disease in which the bones lose minerals and strength with aging. This can result in bone fractures. If you are 64 years old or older, or if you are at risk for osteoporosis and fractures, ask your health care provider if you should: ?Be screened for bone loss. ?Take a calcium or vitamin D supplement to lower your risk of fractures. ?Be given hormone replacement therapy (HRT) to treat symptoms of menopause. ?Follow these instructions at home: ?Alcohol use ?Do not drink alcohol if: ?Your health care provider tells you not to drink. ?You are pregnant, may be pregnant, or are planning to become pregnant. ?If you drink alcohol: ?Limit how much you have to: ?0-1 drink a day. ?Know how much alcohol is in your drink. In the [place] one drink equals one 12 oz bottle of beer (355 mL), one 5 oz glass of wine (148 mL), or one 1? oz glass of hard liquor (44 mL). ?Lifestyle ?Do not use any products that contain nicotine or tobacco. These products include cigarettes, chewing tobacco, and vaping devices, such as e-cigarettes. If you need help quitting, ask your health care provider. ?Do not use street drugs. ?Do not share needles. ?Ask your health care provider for help if you need support or information about quitting drugs. ?General instructions ?Schedule regular health, dental, and eye  exams. ?Stay current with your vaccines. ?Tell your health care provider if: ?You often feel depressed. ?You have ever been abused or do not feel safe at home. ?Summary ?Adopting a healthy lifestyle and getting preventive care are important in promoting health and wellness. ?Follow your health care provider's instructions about healthy diet, exercising, and getting tested or screened for diseases. ?Follow your health care provider's instructions on monitoring your cholesterol and blood pressure. ?This information is not intended to replace advice given to you by your health care provider. Make sure you discuss any questions you have with your health care provider. ?Document Revised: 09/02/2020 Document Reviewed: 09/02/2020 ?Elsevier Patient Education ? 2023 [employer] ? ?

## 2021-08-15 ENCOUNTER — Telehealth: Payer: Self-pay | Admitting: Family Medicine

## 2021-08-15 NOTE — Telephone Encounter (Signed)
Please inform patient the following information: ?Liver, kidney and thyroid function is normal. ?Her blood cell counts and electrolytes are normal/stable ?Her A1c increased from 6.9, now 7.3.  This is still pretty well controlled and we would not change management.  Continue medication at current dose.  Try to increase exercise this spring. ? ?Her bilirubin is a little elevated, and this is not a concern and is common in people when they are fasting.  Hers has been elevated in the past when fasting. ? ?Her vitamin D is in normal range.  ? ?Her B12 is extremely low at 175.  She reported use of B12 1000 mcg tablet daily.  Please make sure this is the sublingual version of B12 that comes in solution or dissolvable tablet that is placed under the tongue.  If she is already taking the sublingual version, then we need to start B12 injections.  If she is not using sublingual, please encourage her to start sublingual version since she does not absorb B12 from her stomach. ? ? ?

## 2021-08-15 NOTE — Telephone Encounter (Signed)
Spoke with pt regarding labs and instructions.   

## 2021-08-24 ENCOUNTER — Encounter: Payer: Self-pay | Admitting: Family Medicine

## 2021-09-01 ENCOUNTER — Other Ambulatory Visit: Payer: Self-pay | Admitting: Family Medicine

## 2021-09-01 ENCOUNTER — Ambulatory Visit (HOSPITAL_BASED_OUTPATIENT_CLINIC_OR_DEPARTMENT_OTHER)
Admission: RE | Admit: 2021-09-01 | Discharge: 2021-09-01 | Disposition: A | Discharge status: Home / Self Care | Payer: PPO | Source: Ambulatory Visit | Attending: Family Medicine | Admitting: Family Medicine

## 2021-09-01 DIAGNOSIS — M85852 Other specified disorders of bone density and structure, left thigh: Secondary | ICD-10-CM | POA: Diagnosis not present

## 2021-09-01 DIAGNOSIS — Z780 Asymptomatic menopausal state: Secondary | ICD-10-CM | POA: Diagnosis not present

## 2021-09-01 DIAGNOSIS — M8580 Other specified disorders of bone density and structure, unspecified site: Secondary | ICD-10-CM | POA: Insufficient documentation

## 2021-09-01 MED ORDER — ALENDRONATE SODIUM 35 MG PO TABS: 35.0000 mg | ORAL_TABLET | ORAL | 11 refills | Status: DC

## 2021-09-01 NOTE — Telephone Encounter (Signed)
Please inform patient her bone density resulted with mild decrease in bone density since last completed 2 years ago.  Her score was -1.3 and this year it is -1.6.  This is still in the osteopenic range.  She does have an increased risk of fracture, which meets criteria to start medication for her osteopenia. ?The medication used as first-line is called Fosamax.  It is a once weekly medication taken on an empty stomach, with a full glass of water.  This medications helps decrease the fracture risk and also helps slow down the decrease in bone density. ? ?If she would like to start this medication I will call this in for her.  Side effects appreciated, if any, are reflux-like symptoms.  However, this is usually in people who do not take medication as prescribed-must be on an empty stomach, with a full glass of water and then not lay down for 30 minutes after taking medication so that it can be digested. ? ? ?Continue vitamin D at 4000 units daily. ?Osteopenia basic recommendations: ?1.) Drink alcohol in moderation only ?2.) Decrease caffeine consumption to no  More than 2.5 cups a day ?3.) Exercise: weight bearing (walking counts), strength and balance training. ?4.) No smoking.  ?5.) Sunlight/Ultraviolet light exposure 30 minutes a day/5 days a week. ?6.) Vitamin D supplement ? ?

## 2021-09-01 NOTE — Addendum Note (Signed)
Addended by: Howard Pouch A on: 09/01/2021 05:30 PM ? ? Modules accepted: Orders ? ?

## 2021-09-01 NOTE — Addendum Note (Signed)
Addended by: Paschal Dopp on: 09/01/2021 05:04 PM ? ? Modules accepted: Orders ? ?

## 2021-09-01 NOTE — Telephone Encounter (Signed)
Spoke with patient regarding results/recommendations.  

## 2021-09-01 NOTE — Telephone Encounter (Signed)
Pt agrees to start medication. Medication pending. ?

## 2021-09-03 NOTE — Telephone Encounter (Signed)
Please advise patient her fracture risk was high enough to meet criteria to start a medication like Fosamax for her osteopenia.  Again, she does not have to take the medication.  This is truly her decision.   ?Whether she decides on the medication or not, she should still try to follow the recommendations for all people diagnosed with osteopenia ...she should increase her exercise, take the vitamin D 4000 units as discussed, back off caffeine etc.   ?We will retest her bone density in 2-3 years. ?The medication was called in for her.  It is once a week. ?

## 2021-09-03 NOTE — Telephone Encounter (Signed)
Please advise, pt is reconsidering new Rx ?

## 2021-09-05 ENCOUNTER — Encounter: Payer: Self-pay | Admitting: Family Medicine

## 2021-09-05 ENCOUNTER — Telehealth (INDEPENDENT_AMBULATORY_CARE_PROVIDER_SITE_OTHER): Payer: PPO | Admitting: Family Medicine

## 2021-09-05 DIAGNOSIS — M8580 Other specified disorders of bone density and structure, unspecified site: Secondary | ICD-10-CM | POA: Diagnosis not present

## 2021-09-05 NOTE — Progress Notes (Signed)
? ? ? ? ? ?VIRTUAL VISIT VIA VIDEO ? ?I connected with Maria Watkins on 09/05/21 at  2:40 PM EDT by a video enabled telemedicine application and verified that I am speaking with the correct person using two identifiers. ?Location patient: Home ?Location provider: [facility], Office ?Persons participating in the virtual visit: Patient, Dr. Claiborne Billings and Les Pou, CMA ? ?I discussed the limitations of evaluation and management by telemedicine and the availability of in person appointments. The patient expressed understanding and agreed to proceed. ? ? ? ? ?Maria Watkins , 05-15-1955, 66 y.o., female ?MRN: [id number] ?Patient Care Team  ?  Relationship Specialty Notifications Start End  ?Natalia Leatherwood, DO PCP - General Family Medicine  10/27/18   ?Charna Elizabeth, MD Consulting Physician Gastroenterology  01/18/18   ?Marlow Baars, MD Consulting Physician Obstetrics  01/18/18   ?Jill Side, OD Referring Physician   04/09/21   ? ? ?Chief Complaint  ?Patient presents with  ? bone density   ? ?  ?Subjective: Pt presents for an OV to discuss fosamax start. Dexa 09/01/2021: -1.6 ?Major Osteoporotic Fracture: 25.1 % ?Hip Fracture:                3.0 % ?She has reservations about starting fosamax and would like to discuss.  ? ? ?  04/09/2021  ?  1:05 PM 01/20/2021  ?  5:48 PM 01/19/2021  ? 10:13 AM 07/25/2020  ?  1:30 PM 07/28/2018  ?  8:09 AM  ?Depression screen PHQ 2/9  ?Decreased Interest 0 0 0 0 0  ?Down, Depressed, Hopeless 0 0 0 0 0  ?PHQ - 2 Score 0 0 0 0 0  ? ? ?Allergies  ?Allergen Reactions  ? Terconazole Nausea Only  ? ?Social History  ? ?Social History Narrative  ? Not on file  ? ?Past Medical History:  ?Diagnosis Date  ? Abnormal Pap smear of cervix 1980's  ? resolved post cryo with normal follow up  ? Depression   ? Elevated alkaline phosphatase level 05/15/2019  ? Estrogen deficiency 05/15/2019  ? Gallstones 08/04/2012  ? GERD (gastroesophageal reflux disease) 11/09/2019  ? Hypertension   ? Lymphadenopathy  08/02/2019  ? Type 2 diabetes mellitus without complication, without long-term current use of insulin (HCC) 01/18/2018  ? ?Past Surgical History:  ?Procedure Laterality Date  ? CERVIX LESION DESTRUCTION  1980s  ? CHOLECYSTECTOMY N/A 01/13/2021  ? Procedure: LAPAROSCOPIC CHOLECYSTECTOMY WITH INTRAOPERATIVE CHOLANGIOGRAM;  Surgeon: Griselda Miner, MD;  Location: [facility] OR;  Service: General;  Laterality: N/A;  ? COLPOSCOPY  1980's  ? ?Family History  ?Problem Relation Age of Onset  ? Diabetes Mother   ? Arthritis Mother   ? COPD Father   ? Asthma Father   ? Heart disease Father   ? Diabetes Sister   ? Asthma Brother   ? COPD Sister   ? Rheum arthritis Sister   ? Pulmonary fibrosis Sister   ?     Passed away 05-08-2020 ? Breast cancer Maternal Aunt   ? Alcohol abuse Son   ? ?Allergies as of 09/05/2021   ? ?   Reactions  ? Terconazole Nausea Only  ? ?  ? ?  ?Medication List  ?  ? ?  ? Accurate as of Sep 05, 2021  2:32 PM. If you have any questions, ask your nurse or doctor.  ?  ?  ? ?  ? ?alendronate 35 MG tablet ?Commonly known as: FOSAMAX ?  Take 1 tablet (35 mg total) by mouth every 7 (seven) days. Take with a full glass of water on an empty stomach. ?  ?cholecalciferol 25 MCG (1000 UNIT) tablet ?Commonly known as: VITAMIN D3 ?Take 1,000 Units by mouth daily. ?  ?fluticasone 50 MCG/ACT nasal spray ?Commonly known as: FLONASE ?Place 2 sprays into both nostrils in the morning and at bedtime. ?  ?lisinopril 40 MG tablet ?Commonly known as: ZESTRIL ?Take 1 tablet (40 mg total) by mouth daily. ?  ?Loratadine 10 MG Chew ?Chew 10 mg by mouth daily as needed for allergies. ?  ?metFORMIN 1000 MG tablet ?Commonly known as: GLUCOPHAGE ?Take 1 tablet (1,000 mg total) by mouth daily with breakfast. ?  ?vitamin B-12 1000 MCG tablet ?Commonly known as: CYANOCOBALAMIN ?Take 1,000 mcg by mouth daily. ?  ? ?  ? ? ?All past medical history, surgical history, allergies, family history, immunizations andmedications were updated in the EMR  today and reviewed under the history and medication portions of their EMR.    ? ?ROS ?Negative, with the exception of above mentioned in HPI ? ? ?Objective:  ?LMP 04/27/2005 (Approximate)  ?There is no height or weight on file to calculate BMI. ?Physical Exam ?Gen: Afebrile. No acute distress.  ?Psych: Normal affect, dress and demeanor. Normal speech. Normal thought content and judgment. ? ?No results found. ?No results found. ?No results found for this or any previous visit (from the past 24 hour(s)). ? ?Assessment/Plan: ?[name] is a 66 y.o. female present for OV for  ?Osteopenia, unspecified location ?- discussed benefits and risks with fosamax 35 mg once weekly.  ?- discussed her fracture risk meets criteria to start medications.  ?- we discussed basic recommendations for bone health again today. Low caffeine, vit d 4000u qd, routine exercise. Avoidance of alcohol use.  ?Rpt dexa in 2-3 yrs.  ?- she has decided she will start fosamax. Script had been sent to her pharmacy.  ? ?Reviewed expectations re: course of current medical issues. ?Discussed self-management of symptoms. ?Outlined signs and symptoms indicating need for more acute intervention. ?Patient verbalized understanding and all questions were answered. ?Patient received an After-Visit Summary. ? ? ? ?No orders of the defined types were placed in this encounter. ? ?No orders of the defined types were placed in this encounter. ? ?Referral Orders  ?No referral(s) requested today  ? ? ? ?Note is dictated utilizing voice recognition software. Although note has been proof read prior to signing, occasional typographical errors still can be missed. If any questions arise, please do not hesitate to call for verification.  ? ?electronically signed by: ? ?Felix Pacini, DO  ?[facility] - OR ? ? ? ?

## 2021-09-05 NOTE — Patient Instructions (Signed)
Osteopenia  Osteopenia is a loss of thickness (density) inside the bones. Another name for osteopenia is low bone mass. Mild osteopenia is a normal part of aging. It is not a disease, and it does not cause symptoms. However, if you have osteopenia and continue to lose bone mass, you could develop a condition that causes the bones to become thin and break more easily (osteoporosis). Osteoporosis can cause you to lose some height, have back pain, and have a stooped posture. Although osteopenia is not a disease, making changes to your lifestyle and diet can help to prevent osteopenia from developing into osteoporosis. What are the causes? Osteopenia is caused by loss of calcium in the bones. Bones are constantly changing. Old bone cells are continually being replaced with new bone cells. This process builds new bone. The mineral calcium is needed to build new bone and maintain bone density. Bone density is usually highest around age 35. After that, most people's bodies cannot replace all the bone they have lost with new bone. What increases the risk? You are more likely to develop this condition if: You are older than age 50. You are a woman who went through menopause early. You have a long illness that keeps you in bed. You do not get enough exercise. You lack certain nutrients (malnutrition). You have an overactive thyroid gland (hyperthyroidism). You use products that contain nicotine or tobacco, such as cigarettes, e-cigarettes and chewing tobacco, or you drink a lot of alcohol. You are taking medicines that weaken the bones, such as steroids. What are the signs or symptoms? This condition does not cause any symptoms. You may have a slightly higher risk for bone breaks (fractures), so getting fractures more easily than normal may be an indication of osteopenia. How is this diagnosed? This condition may be diagnosed based on an X-ray exam that measures bone density (dual-energy X-ray  absorptiometry, or DEXA). This test can measure bone density in your hips, spine, and wrists. Osteopenia has no symptoms, so this condition is usually diagnosed after a routine bone density screening test is done for osteoporosis. This routine screening is usually done for: Women who are age 65 or older. Men who are age 70 or older. If you have risk factors for osteopenia, you may have the screening test at an earlier age. How is this treated? Making dietary and lifestyle changes can lower your risk for osteoporosis. If you have severe osteopenia that is close to becoming osteoporosis, this condition can be treated with medicines and dietary supplements such as calcium and vitamin D. These supplements help to rebuild bone density. Follow these instructions at home: Eating and drinking Eat a diet that is high in calcium and vitamin D. Calcium is found in dairy products, beans, salmon, and leafy green vegetables like spinach and broccoli. Look for foods that have vitamin D and calcium added to them (fortified foods), such as orange juice, cereal, and bread.  Lifestyle Do 30 minutes or more of a weight-bearing exercise every day, such as walking, jogging, or playing a sport. These types of exercises strengthen the bones. Do not use any products that contain nicotine or tobacco, such as cigarettes, e-cigarettes, and chewing tobacco. If you need help quitting, ask your health care provider. Do not drink alcohol if: Your health care provider tells you not to drink. You are pregnant, may be pregnant, or are planning to become pregnant. If you drink alcohol: Limit how much you use to: 0-1 drink a day for women. 0-2   drinks a day for men. Be aware of how much alcohol is in your drink. In the [place] one drink equals one 12 oz bottle of beer (355 mL), one 5 oz glass of wine (148 mL), or one 1 oz glass of hard liquor (44 mL). General instructions Take over-the-counter and prescription medicines only as  told by your health care provider. These include vitamins and supplements. Take precautions at home to lower your risk of falling, such as: Keeping rooms well-lit and free of clutter, such as cords. Installing safety rails on stairs. Using rubber mats in the bathroom or other areas that are often wet or slippery. Keep all follow-up visits. This is important. Contact a health care provider if: You have not had a bone density screening for osteoporosis and you are: A woman who is age 65 or older. A man who is age 70 or older. You are a postmenopausal woman who has not had a bone density screening for osteoporosis. You are older than age 50 and you want to know if you should have bone density screening for osteoporosis. Summary Osteopenia is a loss of thickness (density) inside the bones. Another name for osteopenia is low bone mass. Osteopenia is not a disease, but it may increase your risk for a condition that causes the bones to become thin and break more easily (osteoporosis). You may be at risk for osteopenia if you are older than age 50 or if you are a woman who went through early menopause. Osteopenia does not cause any symptoms, but it can be diagnosed with a bone density screening test. Dietary and lifestyle changes are the first treatment for osteopenia. These may lower your risk for osteoporosis. This information is not intended to replace advice given to you by your health care provider. Make sure you discuss any questions you have with your health care provider. Document Revised: 09/28/2019 Document Reviewed: 09/28/2019 Elsevier Patient Education  2023 Elsevier Inc.  

## 2021-09-11 ENCOUNTER — Telehealth: Payer: Self-pay

## 2021-09-11 NOTE — Telephone Encounter (Signed)
Called pt back and transferred to triage

## 2021-09-11 NOTE — Telephone Encounter (Signed)
Patient thinks she may be having reaction to new meds for Fosamax. She said she hasnt felt good since she started. Neck hurting, lips tingling.  She is leery of taking next dose.   Please call patient [phone]

## 2021-09-12 ENCOUNTER — Ambulatory Visit: Payer: PPO | Admitting: Family Medicine

## 2021-09-15 ENCOUNTER — Ambulatory Visit (INDEPENDENT_AMBULATORY_CARE_PROVIDER_SITE_OTHER): Payer: PPO | Admitting: Family Medicine

## 2021-09-15 ENCOUNTER — Encounter: Payer: Self-pay | Admitting: Family Medicine

## 2021-09-15 VITALS — BP 153/76 | HR 62 | Temp 97.7°F | Ht 62.0 in | Wt 179.0 lb

## 2021-09-15 DIAGNOSIS — T148XXA Other injury of unspecified body region, initial encounter: Secondary | ICD-10-CM | POA: Diagnosis not present

## 2021-09-15 DIAGNOSIS — T50905A Adverse effect of unspecified drugs, medicaments and biological substances, initial encounter: Secondary | ICD-10-CM

## 2021-09-15 NOTE — Telephone Encounter (Signed)
Pt seen by PCP  [facility] Day - Client TELEPHONE ADVICE RECORD AccessNurse Patient Name: Maria Watkins Gender: Female DOB: 1955/07/05 Age: 66 Y 10 M 7 D Return Phone Number: [phone] (Primary) Address: City/ State/ Zip: [place]  [address] Client [facility] Day - Client Client Site [facility] [place] - Day Provider Claiborne Billings, [place] Contact Type Call Who Is Calling Patient / Member / Family / Caregiver Call Type Triage / Clinical Relationship To Patient Self Return Phone Number [phone] (Primary) Chief Complaint Neck Stiffness Reason for Call Symptomatic / Request for Health Information Initial Comment Caller states she has a pt on the phone needing triage. Caller states pt is having a possible medication reaction she is having neck pain and tightness , lips tingling and soreness on the left of her jaw and tiredness. Translation No Nurse Assessment Nurse: Suezanne Jacquet, RN, Riley Lam Date/Time (Eastern Time): 09/11/2021 12:18:50 PM Confirm and document reason for call. If symptomatic, describe symptoms. ---Caller thinks she may possible having a medication reaction /fosemax she took Sunday. Symptoms of having neck pain and tightness Feels like a crick in her neck going on for a few days. Today developed lips tingling when she was cooking breakfast. Yesterday some nausea. While outside yesterday after 3 pm was working in the yard and was mowing with the push mower it was breezy and was only out there about 15 min and got really hot and felt like all her blood went to her face and her neck and jaw tightened up. Washed face with cold wash cloth and drank water and went away. The jaw tightness is only with exertion and not having it today. Neck is still sore. Dr Margot Ables put her on fosamax 35 mg Generic form and took it on SUnday. Does the patient have any new or worsening symptoms? ---Yes Will a triage be completed?  ---Yes Related visit to physician within the last 2 weeks? ---Yes Does the PT have any chronic conditions? (i.e. diabetes, asthma, this includes High risk factors for pregnancy, etc.) ---Yes List chronic conditions. ---osteopenia, prediabetic Is this a behavioral health or substance abuse call? ---No PLEASE NOTE: All timestamps contained within this report are represented as [place] Standard Time. CONFIDENTIALTY NOTICE: This fax transmission is intended only for the addressee. It contains information that is legally privileged, confidential or otherwise protected from use or disclosure. If you are not the intended recipient, you are strictly prohibited from reviewing, disclosing, copying using or disseminating any of this information or taking any action in reliance on or regarding this information. If you have received this fax in error, please notify us immediately by telephone so that we can arrange for its return to [place]. Phone: [phone], Toll-Free: [phone], Fax: [phone] Page: 2 of 2 Call Id: [id number] Guidelines Guideline Title Affirmed Question Affirmed Notes Nurse Date/Time Lamount Cohen Time) Neck Pain or Stiffness [1] MODERATE neck pain (e.g., interferes with normal activities) AND [2] present > 3 days Camillia Herter 09/11/2021 12:26:51 PM Disp. Time Lamount Cohen Time) Disposition Final User 09/11/2021 12:33:16 PM SEE PCP WITHIN 3 DAYS Yes Suezanne Jacquet, RN, [place] Spaniel Disagree/Comply Comply Caller Understands Yes PreDisposition InappropriateToAsk Care Advice Given Per Guideline * You need to be seen within 2 or 3 days. SEE PCP WITHIN 3 DAYS: * IBUPROFEN (E.G., MOTRIN, ADVIL): Take 400 mg (two 200 mg pills) by mouth every 6 hours. The most you should take is 6 pills a day (1,200 mg total). * Avoid activity that  puts stress on the neck. AVOID: * Do not work with your neck turned or bent back. * Do not carry heavy objects on your head. CALL BACK IF: * You become worse  CARE ADVICE given per Neck Pain (Adult) guideline. Referrals REFERRED TO PCP OFFICE

## 2021-09-15 NOTE — Patient Instructions (Signed)
Muscle Strain A muscle strain, or pulled muscle, happens when a muscle is stretched beyond its normal length. This can tear some muscle fibers and cause pain. Usually, it takes 1-2 weeks to heal from a muscle strain. Full healing normally takes 5-6 weeks. What are the causes? This condition is caused when a sudden force is placed on a muscle and stretches it too far. This can happen with a fall, while lifting, or during sports. What increases the risk? You are more likely to develop a muscle strain if you are an athlete or you do a lot of physical activity. What are the signs or symptoms? Pain. Tenderness. Bruising. Swelling. Trouble using the muscle. How is this treated? This condition is first treated with PRICE therapy. This involves: Protecting your muscle from being injured again. Resting your injured muscle. Icing your injured muscle. Putting pressure (compression) on your injured muscle. This may be done with a splint or elastic bandage. Raising (elevating) your injured muscle. Your doctor may also recommend medicine for pain. Follow these instructions at home: If you have a splint that can be taken off: Wear the splint as told by your doctor. Take it off only as told by your doctor. Check the skin around the splint every day. Tell your doctor if you see problems. Loosen the splint if your fingers or toes: Tingle. Become numb. Turn cold and blue. Keep the splint clean. If the splint is not waterproof: Do not let it get wet. Cover it with a watertight covering when you take a bath or a shower. Managing pain, stiffness, and swelling  If told, put ice on your injured area. To do this: If you have a removable splint, take it off as told by your doctor. Put ice in a plastic bag. Place a towel between your skin and the bag. Leave the ice on for 20 minutes, 2-3 times a day. Take off the ice if your skin turns bright red. This is very important. If you cannot feel pain, heat,  or cold, you have a greater risk of damage to the area. Move your fingers or toes often. Raise the injured area above the level of your heart while you are sitting or lying down. Wear an elastic bandage as told by your doctor. Make sure it is not too tight. General instructions Take over-the-counter and prescription medicines only as told by your doctor. This may include: Medicines for pain and swelling that are taken by mouth or put on the skin. Medicines to help relax your muscles. Limit your activity. Rest your injured muscle as told by your doctor. Your doctor may say that gentle movements are okay. If physical therapy was prescribed, do exercises as told by your doctor. Do not put pressure on any part of the splint until it is fully hardened. This may take many hours. Do not smoke or use any products that contain nicotine or tobacco. If you need help quitting, ask your doctor. Ask your doctor when it is safe to drive if you have a splint. Keep all follow-up visits. How is this prevented? Warm up before you exercise. This helps to prevent more muscle strains. Contact a doctor if: You have more pain or swelling in the injured area. Get help right away if: You have any of these problems in your injured area: Numbness. Tingling. Less strength than normal. Summary A muscle strain is an injury that happens when a muscle is stretched beyond normal length. This condition is first treated with PRICE   therapy. This includes protecting, resting, icing, adding pressure, and raising your injury. Limit your activity. Rest your injured muscle as told by your doctor. Your doctor may say that gentle movements are okay. Warm up before you exercise. This helps to prevent more muscle strains. This information is not intended to replace advice given to you by your health care provider. Make sure you discuss any questions you have with your health care provider. Document Revised: 07/01/2020 Document  Reviewed: 07/01/2020 Elsevier Patient Education  2023 Elsevier Inc.  

## 2021-09-15 NOTE — Progress Notes (Signed)
Maria Watkins , 1955-09-19, 66 y.o., female MRN: [id number] Patient Care Team    Relationship Specialty Notifications Start End  Ma Hillock, DO PCP - General Family Medicine  10/27/18   Juanita Craver, MD Consulting Physician Gastroenterology  01/18/18   Jerelyn Charles, MD Consulting Physician Obstetrics  01/18/18   Otelia Sergeant, OD Referring Physician   04/09/21     Chief Complaint  Patient presents with   Nausea    Pt c/o nausea, HA, fatigue, body aches, neck pain x 1 week     Subjective: Pt presents for an OV with complaints of nausea and diarrhea after starting fosamax.   She also endorses right sided neck pain after moving furniture. She has been taking tylenol for discomfort.      04/09/2021    1:05 PM 01/20/2021    5:48 PM 01/19/2021   10:13 AM 07/25/2020    1:30 PM 07/28/2018    8:09 AM  Depression screen PHQ 2/9  Decreased Interest 0 0 0 0 0  Down, Depressed, Hopeless 0 0 0 0 0  PHQ - 2 Score 0 0 0 0 0    Allergies  Allergen Reactions   Terconazole Nausea Only   Social History   Social History Narrative   Not on file   Past Medical History:  Diagnosis Date   Abnormal Pap smear of cervix 1980's   resolved post cryo with normal follow up   Depression    Elevated alkaline phosphatase level 05/15/2019   Estrogen deficiency 05/15/2019   Gallstones 08/04/2012   GERD (gastroesophageal reflux disease) 11/09/2019   Hypertension    Lymphadenopathy 08/02/2019   Type 2 diabetes mellitus without complication, without long-term current use of insulin ([place]) 01/18/2018   Past Surgical History:  Procedure Laterality Date   CERVIX LESION DESTRUCTION  1980s   CHOLECYSTECTOMY N/A 01/13/2021   Procedure: LAPAROSCOPIC CHOLECYSTECTOMY WITH INTRAOPERATIVE CHOLANGIOGRAM;  Surgeon: Jovita Kussmaul, MD;  Location: [facility] OR;  Service: General;  Laterality: N/A;   COLPOSCOPY  22's   Family History  Problem Relation Age of Onset   Diabetes Mother    Arthritis Mother     COPD Father    Asthma Father    Heart disease Father    Diabetes Sister    Asthma Brother    COPD Sister    Rheum arthritis Sister    Pulmonary fibrosis Sister        Passed away 04-05-2020   Breast cancer Maternal Aunt    Alcohol abuse Son    Allergies as of 09/15/2021       Reactions   Terconazole Nausea Only        Medication List        Accurate as of Sep 15, 2021 11:10 AM. If you have any questions, ask your nurse or doctor.          STOP taking these medications    alendronate 35 MG tablet Commonly known as: FOSAMAX Stopped by: Howard Pouch, DO       TAKE these medications    cholecalciferol 25 MCG (1000 UNIT) tablet Commonly known as: VITAMIN D3 Take 2,000 Units by mouth daily.   fluticasone 50 MCG/ACT nasal spray Commonly known as: FLONASE Place 2 sprays into both nostrils in the morning and at bedtime.   lisinopril 40 MG tablet Commonly known as: ZESTRIL Take 1 tablet (40 mg total) by mouth daily.   Loratadine 10 MG Chew Chew 10  mg by mouth daily as needed for allergies.   metFORMIN 1000 MG tablet Commonly known as: GLUCOPHAGE Take 1 tablet (1,000 mg total) by mouth daily with breakfast.   vitamin B-12 1000 MCG tablet Commonly known as: CYANOCOBALAMIN Take 1,000 mcg by mouth daily.        All past medical history, surgical history, allergies, family history, immunizations andmedications were updated in the EMR today and reviewed under the history and medication portions of their EMR.     ROS Negative, with the exception of above mentioned in HPI   Objective:  BP (!) 153/76   Pulse 62   Temp 97.7 F (36.5 C) (Oral)   Ht 5\' 2"  (1.575 m)   Wt 179 lb (81.2 kg)   LMP 04/27/2005 (Approximate)   SpO2 98%   BMI 32.74 kg/m  Body mass index is 32.74 kg/m. Physical Exam Vitals and nursing note reviewed.  Constitutional:      General: She is not in acute distress.    Appearance: Normal appearance. She is normal weight. She  is not ill-appearing or toxic-appearing.  Eyes:     Extraocular Movements: Extraocular movements intact.     Conjunctiva/sclera: Conjunctivae normal.     Pupils: Pupils are equal, round, and reactive to light.  Musculoskeletal:     Cervical back: Spasms and tenderness present. No swelling, signs of trauma, torticollis or bony tenderness. Pain with movement present. Decreased range of motion.  Neurological:     Mental Status: She is alert and oriented to person, place, and time. Mental status is at baseline.  Psychiatric:        Mood and Affect: Mood normal.        Behavior: Behavior normal.        Thought Content: Thought content normal.        Judgment: Judgment normal.    No results found. No results found. No results found for this or any previous visit (from the past 24 hour(s)).  Assessment/Plan: [name] is a 66 y.o. female present for OV for  Muscle strain SCM muscle strain on exam.  Encouraged heat, stretches and light massage.   Medication reaction, initial encounter Dc'd fosamax.  Continue vitamin D 2000 u and rotuine exercise.   Reviewed expectations re: course of current medical issues. Discussed self-management of symptoms. Outlined signs and symptoms indicating need for more acute intervention. Patient verbalized understanding and all questions were answered. Patient received an After-Visit Summary.    No orders of the defined types were placed in this encounter.  No orders of the defined types were placed in this encounter.  Referral Orders  No referral(s) requested today     Note is dictated utilizing voice recognition software. Although note has been proof read prior to signing, occasional typographical errors still can be missed. If any questions arise, please do not hesitate to call for verification.   electronically signed by:  Howard Pouch, DO  [place]

## 2021-12-01 ENCOUNTER — Encounter: Payer: Self-pay | Admitting: Family Medicine

## 2021-12-01 ENCOUNTER — Ambulatory Visit (INDEPENDENT_AMBULATORY_CARE_PROVIDER_SITE_OTHER): Payer: PPO | Admitting: Family Medicine

## 2021-12-01 VITALS — BP 124/75 | HR 55 | Temp 97.8°F | Ht 62.0 in | Wt 173.0 lb

## 2021-12-01 DIAGNOSIS — E119 Type 2 diabetes mellitus without complications: Secondary | ICD-10-CM

## 2021-12-01 DIAGNOSIS — E538 Deficiency of other specified B group vitamins: Secondary | ICD-10-CM | POA: Diagnosis not present

## 2021-12-01 DIAGNOSIS — E7800 Pure hypercholesterolemia, unspecified: Secondary | ICD-10-CM | POA: Diagnosis not present

## 2021-12-01 DIAGNOSIS — I10 Essential (primary) hypertension: Secondary | ICD-10-CM | POA: Diagnosis not present

## 2021-12-01 DIAGNOSIS — E118 Type 2 diabetes mellitus with unspecified complications: Secondary | ICD-10-CM | POA: Diagnosis not present

## 2021-12-01 LAB — VITAMIN B12: Vitamin B-12: 202 pg/mL — ABNORMAL LOW (ref 211–911)

## 2021-12-01 LAB — POCT GLYCOSYLATED HEMOGLOBIN (HGB A1C)
HbA1c POC (<> result, manual entry): 6.8 % (ref 4.0–5.6)
HbA1c, POC (controlled diabetic range): 6.8 % (ref 0.0–7.0)
HbA1c, POC (prediabetic range): 6.8 % — AB (ref 5.7–6.4)
Hemoglobin A1C: 6.8 % — AB (ref 4.0–5.6)

## 2021-12-01 NOTE — Progress Notes (Signed)
Maria Watkins , 1955/06/10, 66 y.o., female MRN: [id number] Patient Care Team    Relationship Specialty Notifications Start End  Natalia Leatherwood, DO PCP - General Family Medicine  10/27/18   Charna Elizabeth, MD Consulting Physician Gastroenterology  01/18/18   Marlow Baars, MD Consulting Physician Obstetrics  01/18/18   Jill Side, OD Referring Physician   04/09/21     Chief Complaint  Patient presents with   Hot Flashes    Pt c/o hot flashes, night sweats, fatigue worsening in the last 2 weeks      Subjective: Pt presents for an OV with complaints of waking with hot flashes nightly since increasing her metformin from 500 mg to 1000 mg qd.  She reports its not every night, but she has noticed she will wake up in the morning and have sweating throughout the night.  She notices she is sweating more during the day when working outside or around the house.    04/09/2021    1:05 PM 01/20/2021    5:48 PM 01/19/2021   10:13 AM 07/25/2020    1:30 PM 07/28/2018    8:09 AM  Depression screen PHQ 2/9  Decreased Interest 0 0 0 0 0  Down, Depressed, Hopeless 0 0 0 0 0  PHQ - 2 Score 0 0 0 0 0    Allergies  Allergen Reactions   Terconazole Nausea Only   Fosamax [Alendronate] Diarrhea and Nausea Only   Social History   Social History Narrative   Not on file   Past Medical History:  Diagnosis Date   Abnormal Pap smear of cervix 1980's   resolved post cryo with normal follow up   Depression    Elevated alkaline phosphatase level 05/15/2019   Estrogen deficiency 05/15/2019   Gallstones 08/04/2012   GERD (gastroesophageal reflux disease) 11/09/2019   Hypertension    Lymphadenopathy 08/02/2019   Type 2 diabetes mellitus without complication, without long-term current use of insulin ([facility]) 01/18/2018   Past Surgical History:  Procedure Laterality Date   CERVIX LESION DESTRUCTION  1980s   CHOLECYSTECTOMY N/A 01/13/2021   Procedure: LAPAROSCOPIC CHOLECYSTECTOMY WITH  INTRAOPERATIVE CHOLANGIOGRAM;  Surgeon: Griselda Miner, MD;  Location: [facility] OR;  Service: General;  Laterality: N/A;   COLPOSCOPY  1980's   Family History  Problem Relation Age of Onset   Diabetes Mother    Arthritis Mother    COPD Father    Asthma Father    Heart disease Father    Diabetes Sister    Asthma Brother    COPD Sister    Rheum arthritis Sister    Pulmonary fibrosis Sister        Passed away 2020-04-29  Breast cancer Maternal Aunt    Alcohol abuse Son    Allergies as of 12/01/2021       Reactions   Terconazole Nausea Only   Fosamax [alendronate] Diarrhea, Nausea Only        Medication List        Accurate as of December 01, 2021 11:59 PM. If you have any questions, ask your nurse or doctor.          cholecalciferol 25 MCG (1000 UNIT) tablet Commonly known as: VITAMIN D3 Take 2,000 Units by mouth daily.   cyanocobalamin 1000 MCG tablet Commonly known as: VITAMIN B12 Take 1,000 mcg by mouth daily.   fluticasone 50 MCG/ACT nasal spray Commonly known as: FLONASE Place 2 sprays into both nostrils  in the morning and at bedtime.   lisinopril 40 MG tablet Commonly known as: ZESTRIL Take 1 tablet (40 mg total) by mouth daily.   Loratadine 10 MG Chew Chew 10 mg by mouth daily as needed for allergies.   metFORMIN 1000 MG tablet Commonly known as: GLUCOPHAGE Take 1 tablet (1,000 mg total) by mouth daily with breakfast.        All past medical history, surgical history, allergies, family history, immunizations andmedications were updated in the EMR today and reviewed under the history and medication portions of their EMR.     ROS Negative, with the exception of above mentioned in HPI   Objective:  BP 124/75   Pulse (!) 55   Temp 97.8 F (36.6 C) (Oral)   Ht 5\' 2"  (1.575 m)   Wt 173 lb (78.5 kg)   LMP 04/27/2005 (Approximate)   SpO2 99%   BMI 31.64 kg/m  Body mass index is 31.64 kg/m. Physical Exam Vitals and nursing note reviewed.   Constitutional:      General: She is not in acute distress.    Appearance: Normal appearance. She is not ill-appearing, toxic-appearing or diaphoretic.  HENT:     Head: Normocephalic and atraumatic.  Eyes:     General: No scleral icterus.       Right eye: No discharge.        Left eye: No discharge.     Extraocular Movements: Extraocular movements intact.     Conjunctiva/sclera: Conjunctivae normal.     Pupils: Pupils are equal, round, and reactive to light.  Cardiovascular:     Rate and Rhythm: Normal rate and regular rhythm.     Heart sounds: No murmur heard. Pulmonary:     Effort: Pulmonary effort is normal. No respiratory distress.     Breath sounds: Normal breath sounds. No wheezing, rhonchi or rales.  Musculoskeletal:     Cervical back: Neck supple. No tenderness.     Right lower leg: No edema.     Left lower leg: No edema.  Lymphadenopathy:     Cervical: No cervical adenopathy.  Skin:    General: Skin is warm and dry.     Coloration: Skin is not jaundiced or pale.     Findings: No erythema or rash.  Neurological:     Mental Status: She is alert and oriented to person, place, and time. Mental status is at baseline.     Motor: No weakness.     Gait: Gait normal.  Psychiatric:        Mood and Affect: Mood normal.        Behavior: Behavior normal.        Thought Content: Thought content normal.        Judgment: Judgment normal.     No results found. No results found. No results found for this or any previous visit (from the past 24 hour(s)).  Assessment/Plan: Maria Watkins is a 66 y.o. female present for OV for  B12 deficiency Only taking B12 supplement that his p.o. and not sublingual - B12 Type II diabetes mellitus with manifestations (HCC)/Morbid obesity (HCC) A1c improved from 7.3 to 6.8.  Discussed with her today it is unlikely she is having low sugars. Fluctuation in sugars could cause sweats, however her sweats seems to be more consistent with  activity. - Basic Metabolic Panel (BMET) - POCT HgB A1C  Essential hypertension/Hypercholesteremia - Basic Metabolic Panel (BMET) Blood pressure is great today. Suspect some of her fatigue is  related to not taking in enough hydration when out working in the sun.  Reviewed expectations re: course of current medical issues. Discussed self-management of symptoms. Outlined signs and symptoms indicating need for more acute intervention. Patient verbalized understanding and all questions were answered. Patient received an After-Visit Summary.    Orders Placed This Encounter  Procedures   B12   Basic Metabolic Panel (BMET)   POCT HgB A1C   Meds ordered this encounter  Medications   metFORMIN (GLUCOPHAGE) 1000 MG tablet    Sig: Take 1 tablet (1,000 mg total) by mouth daily with breakfast.    Dispense:  90 tablet    Refill:  1   lisinopril (ZESTRIL) 40 MG tablet    Sig: Take 1 tablet (40 mg total) by mouth daily.    Dispense:  90 tablet    Refill:  1   Referral Orders  No referral(s) requested today     Note is dictated utilizing voice recognition software. Although note has been proof read prior to signing, occasional typographical errors still can be missed. If any questions arise, please do not hesitate to call for verification.   electronically signed by:  Felix Pacini, DO  [facility] [place]

## 2021-12-01 NOTE — Patient Instructions (Addendum)
Return in about 14 weeks (around 03/09/2022) for Routine chronic condition follow-up.   Cancel October visit.        Great to see you today.  I have refilled the medication(s) we provide.   If labs were collected, we will inform you of lab results once received either by echart message or telephone call.   - echart message- for normal results that have been seen by the patient already.   - telephone call: abnormal results or if patient has not viewed results in their echart.

## 2021-12-02 ENCOUNTER — Telehealth: Payer: Self-pay | Admitting: Family Medicine

## 2021-12-02 LAB — BASIC METABOLIC PANEL
BUN: 12 mg/dL (ref 6–23)
CO2: 24 mEq/L (ref 19–32)
Calcium: 9.4 mg/dL (ref 8.4–10.5)
Chloride: 104 mEq/L (ref 96–112)
Creatinine, Ser: 0.75 mg/dL (ref 0.40–1.20)
GFR: 82 mL/min (ref >60.00)
Glucose, Bld: 136 mg/dL — ABNORMAL HIGH (ref 70–99)
Potassium: 4.1 mEq/L (ref 3.5–5.1)
Sodium: 140 mEq/L (ref 135–145)

## 2021-12-02 NOTE — Telephone Encounter (Signed)
Please inform Maria Watkins her labs indicate her B12 is still severely low. I would encourage her to start the B12 sublingual solution that is placed under the tongue daily at the range of 1000-2000 mcg. She could also elect to have the B12 injections monthly by a nurse visit if desired. This level of B12 certainly is associated with fatigue.   Her kidney function and electrolytes are normal.

## 2021-12-02 NOTE — Telephone Encounter (Signed)
Spoke with pt regarding labs and instructions.   

## 2021-12-05 MED ORDER — METFORMIN HCL 1000 MG PO TABS: 1000.0000 mg | ORAL_TABLET | Freq: Every day | ORAL | 1 refills | Status: DC

## 2021-12-05 MED ORDER — LISINOPRIL 40 MG PO TABS: 40.0000 mg | ORAL_TABLET | Freq: Every day | ORAL | 1 refills | Status: DC

## 2021-12-23 ENCOUNTER — Encounter: Payer: Self-pay | Admitting: Family Medicine

## 2021-12-23 ENCOUNTER — Ambulatory Visit (INDEPENDENT_AMBULATORY_CARE_PROVIDER_SITE_OTHER): Payer: PPO | Admitting: Family Medicine

## 2021-12-23 VITALS — BP 129/76 | HR 57 | Temp 98.3°F | Ht 62.0 in | Wt 176.0 lb

## 2021-12-23 DIAGNOSIS — E538 Deficiency of other specified B group vitamins: Secondary | ICD-10-CM

## 2021-12-23 DIAGNOSIS — E559 Vitamin D deficiency, unspecified: Secondary | ICD-10-CM | POA: Diagnosis not present

## 2021-12-23 DIAGNOSIS — R5383 Other fatigue: Secondary | ICD-10-CM | POA: Diagnosis not present

## 2021-12-23 DIAGNOSIS — R194 Change in bowel habit: Secondary | ICD-10-CM | POA: Diagnosis not present

## 2021-12-23 LAB — CBC WITH DIFFERENTIAL/PLATELET
Basophils Absolute: 0 10*3/uL (ref 0.0–0.1)
Basophils Relative: 0.7 % (ref 0.0–3.0)
Eosinophils Absolute: 0.1 10*3/uL (ref 0.0–0.7)
Eosinophils Relative: 2 % (ref 0.0–5.0)
HCT: 38.6 % (ref 36.0–46.0)
Hemoglobin: 13 g/dL (ref 12.0–15.0)
Lymphocytes Relative: 38 % (ref 12.0–46.0)
Lymphs Abs: 2 10*3/uL (ref 0.7–4.0)
MCHC: 33.6 g/dL (ref 30.0–36.0)
MCV: 87.1 fl (ref 78.0–100.0)
Monocytes Absolute: 0.4 10*3/uL (ref 0.1–1.0)
Monocytes Relative: 8.2 % (ref 3.0–12.0)
Neutro Abs: 2.7 10*3/uL (ref 1.4–7.7)
Neutrophils Relative %: 51.1 % (ref 43.0–77.0)
Platelets: 138 10*3/uL — ABNORMAL LOW (ref 150.0–400.0)
RBC: 4.43 Mil/uL (ref 3.87–5.11)
RDW: 13.6 % (ref 11.5–15.5)
WBC: 5.2 10*3/uL (ref 4.0–10.5)

## 2021-12-23 LAB — VITAMIN B12: Vitamin B-12: 292 pg/mL (ref 211–911)

## 2021-12-23 LAB — VITAMIN D 25 HYDROXY (VIT D DEFICIENCY, FRACTURES): VITD: 33.54 ng/mL (ref 30.00–100.00)

## 2021-12-23 LAB — TSH: TSH: 0.88 u[IU]/mL (ref 0.35–5.50)

## 2021-12-23 NOTE — Progress Notes (Signed)
[name] , [birth year], 66 y.o., female MRN: [id number] Patient Care Team    Relationship Specialty Notifications Start End  Ma Hillock, DO PCP - General Family Medicine  10/27/18   Juanita Craver, MD Consulting Physician Gastroenterology  01/18/18   Jerelyn Charles, MD Consulting Physician Obstetrics  01/18/18   Otelia Sergeant, OD Referring Physician   04/09/21     Chief Complaint  Patient presents with   Fatigue    Pt c/o L arm and pain, fatigue, gait imbalance, HA, L ear pain x 2 days;      Subjective: Maria Watkins Is a 66 y.o. female present today to discuss fatigue, feeling off balance, headache and ear discomfort over the last 2 days.  She states she also felt some left arm pain.  Patient endorses nasal congestion, throat clearing, ear popping.  She has been working out in the yard.  She noticed neck discomfort and left arm discomfort, that is worse with movement and reproducible. She has a history of B12 deficiency and reports she is taking a sublingual tab.  Her last colonoscopy was completed by Dr. Collene Mares in 2016, she reports follow-up was to be in 10 years.  She has noted some mild changes in her bowel movements and endorses she has had some color changes that are darker in her bowel movements. Prior note: Pt presents for an OV with complaints of waking with hot flashes nightly since increasing her metformin from 500 mg to 1000 mg qd.  She reports its not every night, but she has noticed she will wake up in the morning and have sweating throughout the night.  She notices she is sweating more during the day when working outside or around the house.     04/09/2021    1:05 PM 01/20/2021    5:48 PM 01/19/2021   10:13 AM 07/25/2020    1:30 PM 07/28/2018    8:09 AM  Depression screen PHQ 2/9  Decreased Interest 0 0 0 0 0  Down, Depressed, Hopeless 0 0 0 0 0  PHQ - 2 Score 0 0 0 0 0    Allergies  Allergen Reactions   Terconazole Nausea Only   Fosamax [Alendronate]  Diarrhea and Nausea Only   Social History   Social History Narrative   Not on file   Past Medical History:  Diagnosis Date   Abnormal Pap smear of cervix 1980's   resolved post cryo with normal follow up   Depression    Elevated alkaline phosphatase level 05/15/2019   Estrogen deficiency 05/15/2019   Gallstones 08/04/2012   GERD (gastroesophageal reflux disease) 11/09/2019   Hypertension    Lymphadenopathy 08/02/2019   Type 2 diabetes mellitus without complication, without long-term current use of insulin ([place]) 01/18/2018   Past Surgical History:  Procedure Laterality Date   CERVIX LESION DESTRUCTION  1980s   CHOLECYSTECTOMY N/A 01/13/2021   Procedure: LAPAROSCOPIC CHOLECYSTECTOMY WITH INTRAOPERATIVE CHOLANGIOGRAM;  Surgeon: Jovita Kussmaul, MD;  Location: [facility] OR;  Service: General;  Laterality: N/A;   COLPOSCOPY  39's   Family History  Problem Relation Age of Onset   Diabetes Mother    Arthritis Mother    COPD Father    Asthma Father    Heart disease Father    Diabetes Sister    Asthma Brother    COPD Sister    Rheum arthritis Sister    Pulmonary fibrosis Sister        Occupational hygienist  away December 2021   Breast cancer Maternal Aunt    Alcohol abuse Son    Allergies as of 12/23/2021       Reactions   Terconazole Nausea Only   Fosamax [alendronate] Diarrhea, Nausea Only        Medication List        Accurate as of December 23, 2021 10:38 AM. If you have any questions, ask your nurse or doctor.          cholecalciferol 25 MCG (1000 UNIT) tablet Commonly known as: VITAMIN D3 Take 2,000 Units by mouth daily.   cyanocobalamin 1000 MCG tablet Commonly known as: VITAMIN B12 Take 1,000 mcg by mouth daily.   fluticasone 50 MCG/ACT nasal spray Commonly known as: FLONASE Place 2 sprays into both nostrils in the morning and at bedtime.   lisinopril 40 MG tablet Commonly known as: ZESTRIL Take 1 tablet (40 mg total) by mouth daily.   Loratadine 10 MG Chew Chew 10  mg by mouth daily as needed for allergies.   metFORMIN 1000 MG tablet Commonly known as: GLUCOPHAGE Take 1 tablet (1,000 mg total) by mouth daily with breakfast.        All past medical history, surgical history, allergies, family history, immunizations andmedications were updated in the EMR today and reviewed under the history and medication portions of their EMR.     ROS Negative, with the exception of above mentioned in HPI   Objective:  BP 129/76   Pulse (!) 57   Temp 98.3 F (36.8 C) (Oral)   Ht 5\' 2"  (1.575 m)   Wt 176 lb (79.8 kg)   LMP 04/27/2005 (Approximate)   SpO2 97%   BMI 32.19 kg/m  Body mass index is 32.19 kg/m. Physical Exam Vitals and nursing note reviewed.  Constitutional:      General: She is not in acute distress.    Appearance: Normal appearance. She is not ill-appearing, toxic-appearing or diaphoretic.  HENT:     Head: Normocephalic and atraumatic.     Right Ear: Tympanic membrane normal.     Left Ear: Tympanic membrane normal.     Nose: No congestion or rhinorrhea.     Mouth/Throat:     Mouth: Mucous membranes are moist.     Pharynx: No oropharyngeal exudate or posterior oropharyngeal erythema.  Eyes:     General: No scleral icterus.       Right eye: No discharge.        Left eye: No discharge.     Extraocular Movements: Extraocular movements intact.     Conjunctiva/sclera: Conjunctivae normal.     Pupils: Pupils are equal, round, and reactive to light.  Cardiovascular:     Rate and Rhythm: Normal rate and regular rhythm.  Pulmonary:     Effort: Pulmonary effort is normal. No respiratory distress.     Breath sounds: Normal breath sounds. No wheezing, rhonchi or rales.     Comments: Cough not present Abdominal:     General: Bowel sounds are normal. There is no distension.     Palpations: Abdomen is soft.     Tenderness: There is no abdominal tenderness. There is no guarding or rebound.  Musculoskeletal:     Cervical back: Neck supple.  No tenderness.     Right lower leg: No edema.     Left lower leg: No edema.  Lymphadenopathy:     Cervical: No cervical adenopathy.  Skin:    General: Skin is warm and dry.  Coloration: Skin is not jaundiced or pale.     Findings: No erythema or rash.  Neurological:     Mental Status: She is alert and oriented to person, place, and time. Mental status is at baseline.     Motor: No weakness.     Gait: Gait normal.  Psychiatric:        Mood and Affect: Mood normal.        Behavior: Behavior normal.        Thought Content: Thought content normal.        Judgment: Judgment normal.      No results found. No results found. No results found for this or any previous visit (from the past 24 hour(s)).  Assessment/Plan: NOLIA TSCHANTZ is a 66 y.o. female present for OV for  Vitamin D deficiency Check vitamin D levels today to ensure in normal range and not cause of fatigue. - Vitamin D (25 hydroxy)  B12 deficiency Fatigue may be secondary to not absorbing the B12, despite using a sublingual tab.  May need to move to injections or sublingual solution. - B12  fatigue Encouraged her to continue hydration. - CBC w/Diff - IBC + Ferritin - TSH - POC Hemoccult Bld/Stl (3-Cd Home Screen); Future  Change in bowel habit - POC Hemoccult Bld/Stl (3-Cd Home Screen); Future -Possibly could have becoming anemic since she reported darker stools.  We will ensure not passing blood through colon.   Reviewed expectations re: course of current medical issues. Discussed self-management of symptoms. Outlined signs and symptoms indicating need for more acute intervention. Patient verbalized understanding and all questions were answered. Patient received an After-Visit Summary.    No orders of the defined types were placed in this encounter.  No orders of the defined types were placed in this encounter.  Referral Orders  No referral(s) requested today     Note is dictated utilizing  voice recognition software. Although note has been proof read prior to signing, occasional typographical errors still can be missed. If any questions arise, please do not hesitate to call for verification.   electronically signed by:  Felix Pacini, DO  [facility] [place]

## 2021-12-23 NOTE — Patient Instructions (Signed)
We will call you with labs.  I am concerned you may may be anemic.

## 2021-12-24 ENCOUNTER — Telehealth: Payer: Self-pay | Admitting: Family Medicine

## 2021-12-24 LAB — IBC + FERRITIN
Ferritin: 49.5 ng/mL (ref 10.0–291.0)
Iron: 108 ug/dL (ref 42–145)
Saturation Ratios: 24.8 % (ref 20.0–50.0)
TIBC: 435.4 ug/dL (ref 250.0–450.0)
Transferrin: 311 mg/dL (ref 212.0–360.0)

## 2021-12-24 NOTE — Telephone Encounter (Signed)
Please call patient Liver and thyroid function are normal Electrolytes are normal. Vitamin D levels are normal. Blood cell count and iron levels are normal. Waiting on the stool cards to be returned/resulted.  B12 levels have mildly increased from 202 to 292.  This is still extremely low.  I strongly encouraged her to come in B12 injections every 2 weeks for 4 doses to get her levels boosted into normal range so she can start feeling better.   -I encouraged her to start the sublingual B12 solution instead of the dissolvable tab, in hopes that she can absorb the solution better.  A solution is held underneath the tongue for at least 30 seconds.  Hopefully after she receives a few B12 injections, she will be able to keep her levels up with a B12 solution.  If she continues to feel fatigued despite getting B12 levels up into normal range, then I would suggest she follow-up with her cardiologist as well for further eval.

## 2021-12-24 NOTE — Telephone Encounter (Signed)
Spoke with pt regarding labs and instructions.   

## 2021-12-26 ENCOUNTER — Ambulatory Visit (INDEPENDENT_AMBULATORY_CARE_PROVIDER_SITE_OTHER): Payer: PPO

## 2021-12-26 DIAGNOSIS — R5383 Other fatigue: Secondary | ICD-10-CM

## 2021-12-26 DIAGNOSIS — R194 Change in bowel habit: Secondary | ICD-10-CM | POA: Diagnosis not present

## 2021-12-26 DIAGNOSIS — E538 Deficiency of other specified B group vitamins: Secondary | ICD-10-CM

## 2021-12-26 MED ORDER — CYANOCOBALAMIN 1000 MCG/ML IJ SOLN
1000.0000 ug | Freq: Once | INTRAMUSCULAR | Status: AC
  Administered 2021-12-26: 1000 ug via INTRAMUSCULAR

## 2021-12-26 NOTE — Progress Notes (Signed)
Patient came today for 1/4 biweekly b12 injections doses. Patient was given b12 injection, IM left deltoid. Patient tolerated injection well.  Please sign off in PCP absence

## 2021-12-30 LAB — HEMOCCULT SLIDES (X 3 CARDS)
Fecal Occult Blood: NEGATIVE
OCCULT 1: NEGATIVE
OCCULT 2: NEGATIVE
OCCULT 3: NEGATIVE
OCCULT 4: NEGATIVE
OCCULT 5: NEGATIVE

## 2021-12-31 ENCOUNTER — Telehealth: Payer: Self-pay | Admitting: Family Medicine

## 2021-12-31 NOTE — Telephone Encounter (Signed)
Hemoccult cards are negative for blood.  This is good news. However if she does notice bloody stools or very dark tarry stools, she could still be passing blood at those times-just not caught on the days she collected for her Hemoccult cards. If She does notice these we need to get her to gastroenterology right away.

## 2021-12-31 NOTE — Telephone Encounter (Signed)
Spoke with patient regarding results/recommendations.  

## 2021-12-31 NOTE — Telephone Encounter (Signed)
LM for pt to return call to discuss.  

## 2022-01-09 ENCOUNTER — Ambulatory Visit (INDEPENDENT_AMBULATORY_CARE_PROVIDER_SITE_OTHER): Payer: PPO

## 2022-01-09 DIAGNOSIS — E538 Deficiency of other specified B group vitamins: Secondary | ICD-10-CM

## 2022-01-09 MED ORDER — CYANOCOBALAMIN 1000 MCG/ML IJ SOLN
1000.0000 ug | INTRAMUSCULAR | Status: AC
  Administered 2022-01-09 – 2022-02-06 (×3): 1000 ug via INTRAMUSCULAR

## 2022-01-09 NOTE — Progress Notes (Signed)
Patient came today for 2/4 biweekly b12 injections doses. Patient was given b12 injection, IM left deltoid. Patient tolerated injection well.

## 2022-01-23 ENCOUNTER — Ambulatory Visit (INDEPENDENT_AMBULATORY_CARE_PROVIDER_SITE_OTHER): Payer: PPO

## 2022-01-23 VITALS — BP 132/86 | HR 66 | Wt 176.0 lb

## 2022-01-23 DIAGNOSIS — E538 Deficiency of other specified B group vitamins: Secondary | ICD-10-CM | POA: Diagnosis not present

## 2022-01-23 DIAGNOSIS — Z0000 Encounter for general adult medical examination without abnormal findings: Secondary | ICD-10-CM | POA: Diagnosis not present

## 2022-01-23 NOTE — Progress Notes (Signed)
Subjective:   Maria Watkins is a 66 y.o. female who presents for Medicare Annual (Subsequent) preventive examination.  Review of Systems    Defer to PCP       Objective:    Today's Vitals   01/23/22 1030  BP: 132/86  Pulse: 66  SpO2: 98%  Weight: 176 lb (79.8 kg)   Body mass index is 32.19 kg/m.     01/20/2021    5:47 PM 01/19/2021   10:12 AM 01/13/2021   11:53 AM 01/07/2021   11:28 AM  Advanced Directives  Does Patient Have a Medical Advance Directive? No No No No  Would patient like information on creating a medical advance directive? No - Patient declined No - Patient declined No - Patient declined No - Patient declined    Current Medications (verified) Outpatient Encounter Medications as of 01/23/2022  Medication Sig   cholecalciferol (VITAMIN D3) 25 MCG (1000 UT) tablet Take 2,000 Units by mouth daily.   fluticasone (FLONASE) 50 MCG/ACT nasal spray Place 2 sprays into both nostrils in the morning and at bedtime.   lisinopril (ZESTRIL) 40 MG tablet Take 1 tablet (40 mg total) by mouth daily.   Loratadine 10 MG CHEW Chew 10 mg by mouth daily as needed for allergies.   metFORMIN (GLUCOPHAGE) 1000 MG tablet Take 1 tablet (1,000 mg total) by mouth daily with breakfast.   vitamin B-12 (CYANOCOBALAMIN) 1000 MCG tablet Take 1,000 mcg by mouth daily.   Facility-Administered Encounter Medications as of 01/23/2022  Medication   cyanocobalamin (VITAMIN B12) injection 1,000 mcg    Allergies (verified) Terconazole and Fosamax [alendronate]   History: Past Medical History:  Diagnosis Date   Abnormal Pap smear of cervix 1980's   resolved post cryo with normal follow up   Depression    Elevated alkaline phosphatase level 05/15/2019   Estrogen deficiency 05/15/2019   Gallstones 08/04/2012   GERD (gastroesophageal reflux disease) 11/09/2019   Hypertension    Lymphadenopathy 08/02/2019   Type 2 diabetes mellitus without complication, without long-term current use of  insulin (HCC) 01/18/2018   Past Surgical History:  Procedure Laterality Date   CERVIX LESION DESTRUCTION  1980s   CHOLECYSTECTOMY N/A 01/13/2021   Procedure: LAPAROSCOPIC CHOLECYSTECTOMY WITH INTRAOPERATIVE CHOLANGIOGRAM;  Surgeon: Griselda Miner, MD;  Location: [facility] OR;  Service: General;  Laterality: N/A;   COLPOSCOPY  1980's   Family History  Problem Relation Age of Onset   Diabetes Mother    Arthritis Mother    COPD Father    Asthma Father    Heart disease Father    Diabetes Sister    Asthma Brother    COPD Sister    Rheum arthritis Sister    Pulmonary fibrosis Sister        Passed away 04/26/2020  Breast cancer Maternal Aunt    Alcohol abuse Son    Social History   Socioeconomic History   Marital status: Married    Spouse name: Not on file   Number of children: Not on file   Years of education: Not on file   Highest education level: Not on file  Occupational History   Occupation: retired  Tobacco Use   Smoking status: Former    Types: Cigarettes    Quit date: 04/28/1991    Years since quitting: 30.7   Smokeless tobacco: Never  Vaping Use   Vaping Use: Never used  Substance and Sexual Activity   Alcohol use: No   Drug use: No  Sexual activity: Never    Birth control/protection: Post-menopausal  Other Topics Concern   Not on file  Social History Narrative   Not on file   Social Determinants of Health   Financial Resource Strain: Low Risk  (01/23/2022)   Overall Financial Resource Strain (CARDIA)    Difficulty of Paying Living Expenses: Not hard at all  Food Insecurity: No Food Insecurity (01/20/2021)   Hunger Vital Sign    Worried About Running Out of Food in the Last Year: Never true    Ran Out of Food in the Last Year: Never true  Transportation Needs: No Transportation Needs (01/23/2022)   PRAPARE - Administrator, Civil Service (Medical): No    Lack of Transportation (Non-Medical): No  Physical Activity: Insufficiently Active  (01/23/2022)   Exercise Vital Sign    Days of Exercise per Week: 7 days    Minutes of Exercise per Session: 10 min  Stress: No Stress Concern Present (01/23/2022)   [employer] of Occupational Health - Occupational Stress Questionnaire    Feeling of Stress : Not at all  Social Connections: Moderately Integrated (01/23/2022)   Social Connection and Isolation Panel [NHANES]    Frequency of Communication with Friends and Family: More than three times a week    Frequency of Social Gatherings with Friends and Family: Once a week    Attends Religious Services: More than 4 times per year    Active Member of [employer] or Organizations: No    Attends Engineer, structural: Never    Marital Status: Married    Tobacco Counseling Counseling given: Not Answered   Clinical Intake:  Pre-visit preparation completed: No  Pain : No/denies pain     Nutritional Risks: None Diabetes: No (prediabetes)  How often do you need to have someone help you when you read instructions, pamphlets, or other written materials from your doctor or pharmacy?: 1 - Never  Diabetic?no/prediabetes  Interpreter Needed?: No      Activities of Daily Living    01/23/2022    9:33 AM  In your present state of health, do you have any difficulty performing the following activities:  Hearing? 0  Vision? 0  Difficulty concentrating or making decisions? 0  Walking or climbing stairs? 0  Dressing or bathing? 0  Doing errands, shopping? 0  Preparing Food and eating ? N  Using the Toilet? N  In the past six months, have you accidently leaked urine? N  Do you have problems with loss of bowel control? N  Managing your Medications? N  Managing your Finances? N  Housekeeping or managing your Housekeeping? N    Patient Care Team: Natalia Leatherwood, DO as PCP - General (Family Medicine) Charna Elizabeth, MD as Consulting Physician (Gastroenterology) Marlow Baars, MD as Consulting Physician (Obstetrics) Jill Side, OD as Referring Physician  Indicate any recent Medical Services you may have received from other than [facility] providers in the past year (date may be approximate).     Assessment:   This is a routine wellness examination for [place].  Hearing/Vision screen No results found.  Dietary issues and exercise activities discussed:     Goals Addressed   None   Depression Screen    01/23/2022   10:35 AM 01/23/2022    9:27 AM 04/09/2021    1:05 PM 01/20/2021    5:48 PM 01/19/2021   10:13 AM 07/25/2020    1:30 PM 07/28/2018    8:09 AM  PHQ 2/9  Scores  PHQ - 2 Score 0 0 0 0 0 0 0    Fall Risk    01/23/2022    9:34 AM 04/09/2021    1:05 PM 01/20/2021    5:48 PM 01/19/2021   10:13 AM 07/25/2020    9:27 AM  Fall Risk   Falls in the past year? 0 0 0 0 0  Number falls in past yr: 0 0 0 0 0  Injury with Fall? 0 0 0 0 0  Risk for fall due to :    No Fall Risks   Follow up Falls evaluation completed Falls evaluation completed       FALL RISK PREVENTION PERTAINING TO THE HOME:  Any stairs in or around the home? No  If so, are there any without handrails? No  Home free of loose throw rugs in walkways, pet beds, electrical cords, etc? Yes  Adequate lighting in your home to reduce risk of falls? Yes   ASSISTIVE DEVICES UTILIZED TO PREVENT FALLS:  Life alert? No  Use of a cane, walker or w/c? No  Grab bars in the bathroom? No  Shower chair or bench in shower? No  Elevated toilet seat or a handicapped toilet? No   TIMED UP AND GO:  Was the test performed? Yes .  Length of time to ambulate 10 feet: 6 sec.   Gait steady and fast without use of assistive device  Cognitive Function:        01/23/2022    9:37 AM 01/20/2021    5:54 PM  6CIT Screen  What Year? 0 points 0 points  What month? 0 points 0 points  What time? 0 points 0 points  Count back from 20 0 points 0 points  Months in reverse 0 points 0 points  Repeat phrase 0 points 0 points  Total Score 0 points 0 points     Immunizations Immunization History  Administered Date(s) Administered   Fluad Quad(high Dose 65+) 01/16/2019, 03/19/2021   Influenza Inj Mdck Quad With Preservative 12/27/2018   Influenza, High Dose Seasonal PF 01/16/2019, 02/20/2020   Influenza,inj,Quad PF,6+ Mos 01/18/2018   Moderna Sars-Covid-2 Vaccination 06/08/2019, 07/07/2019   Pneumococcal Conjugate-13 02/23/2020   Pneumococcal Polysaccharide-23 04/14/2018   Tdap 04/27/2013    TDAP status: Up to date  Flu Vaccine status: Due, Education has been provided regarding the importance of this vaccine. Advised may receive this vaccine at local pharmacy or Health Dept. Aware to provide a copy of the vaccination record if obtained from local pharmacy or Health Dept. Verbalized acceptance and understanding.  Pneumococcal vaccine status: Up to date  Covid-19 vaccine status: Completed vaccines  Qualifies for Shingles Vaccine? Yes   Zostavax completed No   Shingrix Completed?: No.    Education has been provided regarding the importance of this vaccine. Patient has been advised to call insurance company to determine out of pocket expense if they have not yet received this vaccine. Advised may also receive vaccine at local pharmacy or Health Dept. Verbalized acceptance and understanding.  Screening Tests Health Maintenance  Topic Date Due   FOOT EXAM  07/25/2021   INFLUENZA VACCINE  07/26/2022 (Originally 11/25/2021)   Zoster Vaccines- Shingrix (1 of 2) 12/03/2023 (Originally 11/05/2003)   HEMOGLOBIN A1C  06/03/2022   OPHTHALMOLOGY EXAM  06/24/2022   Diabetic kidney evaluation - Urine ACR  08/15/2022   Diabetic kidney evaluation - GFR measurement  12/02/2022   MAMMOGRAM  02/21/2023   Pneumonia Vaccine 34+ Years old (11 -  PPSV23 or PCV20) 04/15/2023   TETANUS/TDAP  04/28/2023   DEXA SCAN  09/02/2023   COLONOSCOPY (Pts 45-55yrs Insurance coverage will need to be confirmed)  08/25/2024   Hepatitis C Screening  Completed   HPV VACCINES   Aged Out   COVID-19 Vaccine  Discontinued    Health Maintenance  Health Maintenance Due  Topic Date Due   FOOT EXAM  07/25/2021    Colorectal cancer screening: Type of screening: Colonoscopy. Completed 08/26/2014. Repeat every 10 years  Mammogram status: Completed 02/20/21. Repeat every year  Bone Density status: Completed 09/01/21. Results reflect: Bone density results: OSTEOPENIA. Repeat every 2 years.  Lung Cancer Screening: (Low Dose CT Chest recommended if Age 10-80 years, 30 pack-year currently smoking OR have quit w/in 15years.) does not qualify.   Lung Cancer Screening Referral: n/a  Additional Screening:  Hepatitis C Screening: does qualify; Completed 01/07/2016  Vision Screening: Recommended annual ophthalmology exams for early detection of glaucoma and other disorders of the eye. Is the patient up to date with their annual eye exam?  Yes  Who is the provider or what is the name of the office in which the patient attends annual eye exams? [facility] If pt is not established with a provider, would they like to be referred to a provider to establish care? No .   Dental Screening: Recommended annual dental exams for proper oral hygiene  Community Resource Referral / Chronic Care Management: CRR required this visit?  No   CCM required this visit?  No      Plan:     I have personally reviewed and noted the following in the patient's chart:   Medical and social history Use of alcohol, tobacco or illicit drugs  Current medications and supplements including opioid prescriptions. Patient is not currently taking opioid prescriptions. Functional ability and status Nutritional status Physical activity Advanced directives List of other physicians Hospitalizations, surgeries, and ER visits in previous 12 months Vitals Screenings to include cognitive, depression, and falls Referrals and appointments  In addition, I have reviewed and discussed with patient certain  preventive protocols, quality metrics, and best practice recommendations. A written personalized care plan for preventive services as well as general preventive health recommendations were provided to patient.     Filomena Jungling, CMA   01/23/2022   Nurse Notes: Non-Face to Face or Face to Face 20 minute visit Encounter   Maria Watkins , Thank you for taking time to come for your Medicare Wellness Visit. I appreciate your ongoing commitment to your health goals. Please review the following plan we discussed and let me know if I can assist you in the future.   These are the goals we discussed:  Goals   None     This is a list of the screening recommended for you and due dates:  Health Maintenance  Topic Date Due   Complete foot exam   07/25/2021   Flu Shot  07/26/2022*   Zoster (Shingles) Vaccine (1 of 2) 12/03/2023*   Hemoglobin A1C  06/03/2022   Eye exam for diabetics  06/24/2022   Yearly kidney health urinalysis for diabetes  08/15/2022   Yearly kidney function blood test for diabetes  12/02/2022   Mammogram  02/21/2023   Pneumonia Vaccine (3 - PPSV23 or PCV20) 04/15/2023   Tetanus Vaccine  04/28/2023   DEXA scan (bone density measurement)  09/02/2023   Colon Cancer Screening  08/25/2024   Hepatitis C Screening: USPSTF Recommendation to screen - Ages 18-79  yo.  Completed   HPV Vaccine  Aged Out   COVID-19 Vaccine  Discontinued  *Topic was postponed. The date shown is not the original due date.

## 2022-01-23 NOTE — Progress Notes (Signed)
Pt here for monthly B12 injection per [facility]   B12 1000mcg given IM, and pt tolerated injection well.   Next B12 injection scheduled for 2 weeks. 

## 2022-01-23 NOTE — Patient Instructions (Signed)

## 2022-01-29 ENCOUNTER — Ambulatory Visit: Payer: PPO | Admitting: Family Medicine

## 2022-02-04 ENCOUNTER — Ambulatory Visit (INDEPENDENT_AMBULATORY_CARE_PROVIDER_SITE_OTHER): Payer: PPO

## 2022-02-04 DIAGNOSIS — Z23 Encounter for immunization: Secondary | ICD-10-CM | POA: Diagnosis not present

## 2022-02-06 ENCOUNTER — Ambulatory Visit (INDEPENDENT_AMBULATORY_CARE_PROVIDER_SITE_OTHER): Payer: PPO

## 2022-02-06 DIAGNOSIS — E538 Deficiency of other specified B group vitamins: Secondary | ICD-10-CM | POA: Diagnosis not present

## 2022-02-06 NOTE — Progress Notes (Signed)
Patient came today for 4/4 biweekly b12 injections doses. Patient was given b12 injection, IM left deltoid. Patient tolerated injection well.

## 2022-02-23 DIAGNOSIS — Z1231 Encounter for screening mammogram for malignant neoplasm of breast: Secondary | ICD-10-CM | POA: Diagnosis not present

## 2022-03-09 ENCOUNTER — Ambulatory Visit: Payer: PPO | Admitting: Family Medicine

## 2022-03-10 ENCOUNTER — Encounter: Payer: Self-pay | Admitting: Family Medicine

## 2022-03-10 ENCOUNTER — Telehealth: Payer: Self-pay

## 2022-03-10 ENCOUNTER — Ambulatory Visit (INDEPENDENT_AMBULATORY_CARE_PROVIDER_SITE_OTHER): Payer: PPO | Admitting: Family Medicine

## 2022-03-10 VITALS — BP 124/82 | HR 59 | Temp 98.4°F | Wt 181.8 lb

## 2022-03-10 DIAGNOSIS — I10 Essential (primary) hypertension: Secondary | ICD-10-CM | POA: Diagnosis not present

## 2022-03-10 DIAGNOSIS — E559 Vitamin D deficiency, unspecified: Secondary | ICD-10-CM

## 2022-03-10 DIAGNOSIS — E785 Hyperlipidemia, unspecified: Secondary | ICD-10-CM

## 2022-03-10 DIAGNOSIS — E538 Deficiency of other specified B group vitamins: Secondary | ICD-10-CM

## 2022-03-10 DIAGNOSIS — E119 Type 2 diabetes mellitus without complications: Secondary | ICD-10-CM

## 2022-03-10 DIAGNOSIS — E1169 Type 2 diabetes mellitus with other specified complication: Secondary | ICD-10-CM | POA: Diagnosis not present

## 2022-03-10 DIAGNOSIS — M85852 Other specified disorders of bone density and structure, left thigh: Secondary | ICD-10-CM | POA: Diagnosis not present

## 2022-03-10 LAB — HEMOGLOBIN A1C: Hgb A1c MFr Bld: 7.3 % — ABNORMAL HIGH (ref 4.6–6.5)

## 2022-03-10 LAB — VITAMIN B12: Vitamin B-12: 340 pg/mL (ref 211–911)

## 2022-03-10 MED ORDER — METFORMIN HCL 1000 MG PO TABS: 1000.0000 mg | ORAL_TABLET | Freq: Every day | ORAL | 1 refills | Status: DC

## 2022-03-10 MED ORDER — LISINOPRIL 40 MG PO TABS: 40.0000 mg | ORAL_TABLET | Freq: Every day | ORAL | 1 refills | Status: DC

## 2022-03-10 NOTE — Telephone Encounter (Signed)
A user error has taken place: encounter opened in error, closed for administrative reasons.

## 2022-03-10 NOTE — Patient Instructions (Addendum)
Return in about 5 months (around 08/17/2022) for cpe (20 min), Routine chronic condition follow-up.        Great to see you today.  I have refilled the medication(s) we provide.   If labs were collected, we will inform you of lab results once received either by echart message or telephone call.   - echart message- for normal results that have been seen by the patient already.   - telephone call: abnormal results or if patient has not viewed results in their echart.

## 2022-03-10 NOTE — Progress Notes (Unsigned)
Maria Watkins , 18-Oct-1955, 66 y.o., female MRN: [id number] Patient Care Team    Relationship Specialty Notifications Start End  Natalia Leatherwood, DO PCP - General Family Medicine  12/26/21   Charna Elizabeth, MD Consulting Physician Gastroenterology  01/18/18   Marlow Baars, MD Consulting Physician Obstetrics  01/18/18   Jill Side, OD Referring Physician   04/09/21     Chief Complaint  Patient presents with   Diabetes    Pt is fasting; cmc     Subjective: Maria Watkins is a 66 y.o. female present for [facility] Essential hypertension/Morbid obesity (HCC)/palpitations Pt reports compliance with ***. Blood pressures ranges at home ***. Patient denies chest pain, shortness of breath or lower extremity edema. Pt *** daily baby ASA. Pt is *** prescribed statin.   Type 2 diabetes mellitus with hyperlipidemia (HCC) Pt reports compliance with ***. Denies numbness, tingling of extremities, hypo/hyperglycemic events or non-healing wounds. Pt reports BG ranges ***.  PNA series: *** Flu shot: *** (recommneded yearly) BMP: *** Foot exam: *** Eye exam: ***, Dr. Marland Kitchen A1c:   Osteopenia of neck of left femur/Vitamin D deficiency ***  B12 deficiency *** - gained 5 lbs    01/23/2022   10:35 AM 01/23/2022    9:27 AM 04/09/2021    1:05 PM 01/20/2021    5:48 PM 01/19/2021   10:13 AM  Depression screen PHQ 2/9  Decreased Interest 0 0 0 0 0  Down, Depressed, Hopeless 0 0 0 0 0  PHQ - 2 Score 0 0 0 0 0    Allergies  Allergen Reactions   Terconazole Nausea Only   Fosamax [Alendronate] Diarrhea and Nausea Only   Social History   Social History Narrative   Not on file   Past Medical History:  Diagnosis Date   Abnormal Pap smear of cervix 1980's   resolved post cryo with normal follow up   Depression    Elevated alkaline phosphatase level 05/15/2019   Estrogen deficiency 05/15/2019   Gallstones 08/04/2012   GERD (gastroesophageal reflux disease) 11/09/2019   Hypertension     Lymphadenopathy 08/02/2019   Type 2 diabetes mellitus without complication, without long-term current use of insulin (HCC) 01/18/2018   Past Surgical History:  Procedure Laterality Date   CERVIX LESION DESTRUCTION  1980s   CHOLECYSTECTOMY N/A 01/13/2021   Procedure: LAPAROSCOPIC CHOLECYSTECTOMY WITH INTRAOPERATIVE CHOLANGIOGRAM;  Surgeon: Griselda Miner, MD;  Location: [facility] OR;  Service: General;  Laterality: N/A;   COLPOSCOPY  1980's   Family History  Problem Relation Age of Onset   Diabetes Mother    Arthritis Mother    COPD Father    Asthma Father    Heart disease Father    Diabetes Sister    Asthma Brother    COPD Sister    Rheum arthritis Sister    Pulmonary fibrosis Sister        Passed away 2020-04-11  Breast cancer Maternal Aunt    Alcohol abuse Son    Allergies as of 03/10/2022       Reactions   Terconazole Nausea Only   Fosamax [alendronate] Diarrhea, Nausea Only        Medication List        Accurate as of March 10, 2022  1:51 PM. If you have any questions, ask your nurse or doctor.          cholecalciferol 25 MCG (1000 UNIT) tablet Commonly known as: VITAMIN D3 Take  2,000 Units by mouth daily.   cyanocobalamin 1000 MCG tablet Commonly known as: VITAMIN B12 Take 1,000 mcg by mouth daily.   fluticasone 50 MCG/ACT nasal spray Commonly known as: FLONASE Place 2 sprays into both nostrils in the morning and at bedtime.   lisinopril 40 MG tablet Commonly known as: ZESTRIL Take 1 tablet (40 mg total) by mouth daily.   Loratadine 10 MG Chew Chew 10 mg by mouth daily as needed for allergies.   metFORMIN 1000 MG tablet Commonly known as: GLUCOPHAGE Take 1 tablet (1,000 mg total) by mouth daily with breakfast.        All past medical history, surgical history, allergies, family history, immunizations andmedications were updated in the EMR today and reviewed under the history and medication portions of their EMR.     ROS Negative, with the  exception of above mentioned in HPI   Objective:  BP 124/82   Pulse (!) 59   Temp 98.4 F (36.9 C)   Wt 181 lb 12.8 oz (82.5 kg)   LMP 04/27/2005 (Approximate)   SpO2 98%   BMI 33.25 kg/m  Body mass index is 33.25 kg/m. Physical Exam Vitals and nursing note reviewed.  Constitutional:      General: She is not in acute distress.    Appearance: Normal appearance. She is not ill-appearing, toxic-appearing or diaphoretic.  HENT:     Head: Normocephalic and atraumatic.  Eyes:     General: No scleral icterus.       Right eye: No discharge.        Left eye: No discharge.     Extraocular Movements: Extraocular movements intact.     Conjunctiva/sclera: Conjunctivae normal.     Pupils: Pupils are equal, round, and reactive to light.  Cardiovascular:     Rate and Rhythm: Normal rate and regular rhythm.     Heart sounds: No murmur heard. Pulmonary:     Effort: Pulmonary effort is normal. No respiratory distress.     Breath sounds: Normal breath sounds. No wheezing, rhonchi or rales.  Musculoskeletal:        General: Swelling present.     Cervical back: Neck supple. No tenderness.     Right lower leg: No edema.     Left lower leg: No edema.     Comments: Bilateral hand swelling 1-2nd MCP  Lymphadenopathy:     Cervical: No cervical adenopathy.  Skin:    General: Skin is warm and dry.     Coloration: Skin is not jaundiced or pale.     Findings: No erythema or rash.  Neurological:     Mental Status: She is alert and oriented to person, place, and time. Mental status is at baseline.     Motor: No weakness.     Gait: Gait normal.  Psychiatric:        Mood and Affect: Mood normal.        Behavior: Behavior normal.        Thought Content: Thought content normal.        Judgment: Judgment normal.     No results found. No results found. No results found for this or any previous visit (from the past 24 hour(s)).  Assessment/Plan: Maria Watkins is a 66 y.o. female present  for OV for  B12 deficiency Supplementing B12 *** - B12 collected today  Type II diabetes mellitus with manifestations (HCC)/Morbid obesity (HCC) A1c improved from 7.3 to 6.8.   Continue metformin - dose adjusted as  necessary after labs resulted - HgB A1C collected today  Essential hypertension/Hypercholesteremia  Continue lisinopril 40 mg qd Continue amlodipine ***  Reviewed expectations re: course of current medical issues. Discussed self-management of symptoms. Outlined signs and symptoms indicating need for more acute intervention. Patient verbalized understanding and all questions were answered. Patient received an After-Visit Summary.    Orders Placed This Encounter  Procedures   B12   Hemoglobin A1c   Meds ordered this encounter  Medications   lisinopril (ZESTRIL) 40 MG tablet    Sig: Take 1 tablet (40 mg total) by mouth daily.    Dispense:  90 tablet    Refill:  1   metFORMIN (GLUCOPHAGE) 1000 MG tablet    Sig: Take 1 tablet (1,000 mg total) by mouth daily with breakfast.    Dispense:  90 tablet    Refill:  1   Referral Orders  No referral(s) requested today     Note is dictated utilizing voice recognition software. Although note has been proof read prior to signing, occasional typographical errors still can be missed. If any questions arise, please do not hesitate to call for verification.   electronically signed by:  Felix Pacini, DO  [facility] - [place]

## 2022-03-11 ENCOUNTER — Telehealth: Payer: Self-pay | Admitting: Family Medicine

## 2022-03-11 DIAGNOSIS — E538 Deficiency of other specified B group vitamins: Secondary | ICD-10-CM

## 2022-03-11 MED ORDER — CYANOCOBALAMIN 1000 MCG/ML IJ SOLN
1000.0000 ug | INTRAMUSCULAR | Status: AC
  Administered 2022-04-10 – 2023-02-10 (×11): 1000 ug via INTRAMUSCULAR

## 2022-03-11 NOTE — Telephone Encounter (Signed)
Spoke with patient regarding results/recommendations.  

## 2022-03-11 NOTE — Telephone Encounter (Signed)
Please inform patient the following information: Her B12 is slowly improving and was 340.  Would recommend she continue B12 injections every 4 weeks by nurse visit and continue the sublingual B12 at home.  She is agreeable B12 injections every 4 weeks x 12 approved Her A1c is increasing and is back to 7.3.  I would encourage her to make sure she is taking her metformin daily.  Obtain routine exercise.  And refrain from consuming sugary content snacks/drinks or desserts. If next A1c continues to rise we would have to add another medication to her metformin.   Follow-up on diabetes every 4 months

## 2022-03-11 NOTE — Addendum Note (Signed)
Addended by: Filomena Jungling on: 03/11/2022 04:28 PM   Modules accepted: Orders

## 2022-04-10 ENCOUNTER — Ambulatory Visit (INDEPENDENT_AMBULATORY_CARE_PROVIDER_SITE_OTHER): Payer: PPO

## 2022-04-10 DIAGNOSIS — E538 Deficiency of other specified B group vitamins: Secondary | ICD-10-CM

## 2022-04-10 NOTE — Progress Notes (Signed)
Patient came today for monthly b12 injections doses. Patient was given b12 injection, IM left deltoid. Patient tolerated injection well.  

## 2022-04-28 ENCOUNTER — Telehealth: Payer: Self-pay | Admitting: Family Medicine

## 2022-04-28 NOTE — Telephone Encounter (Signed)
Pharmacy update

## 2022-04-28 NOTE — Telephone Encounter (Signed)
Pharmacy update to [facility] in [place]

## 2022-04-28 NOTE — Telephone Encounter (Signed)
Pt wanted to request change in pharmacy to [facility].

## 2022-05-11 ENCOUNTER — Encounter: Payer: Self-pay | Admitting: Family Medicine

## 2022-05-11 ENCOUNTER — Ambulatory Visit: Payer: PPO

## 2022-05-11 ENCOUNTER — Ambulatory Visit (INDEPENDENT_AMBULATORY_CARE_PROVIDER_SITE_OTHER): Payer: PPO | Admitting: Family Medicine

## 2022-05-11 VITALS — BP 161/80 | HR 64 | Temp 97.7°F | Ht 62.0 in | Wt 181.0 lb

## 2022-05-11 DIAGNOSIS — H9202 Otalgia, left ear: Secondary | ICD-10-CM | POA: Diagnosis not present

## 2022-05-11 DIAGNOSIS — E538 Deficiency of other specified B group vitamins: Secondary | ICD-10-CM | POA: Diagnosis not present

## 2022-05-11 DIAGNOSIS — M26622 Arthralgia of left temporomandibular joint: Secondary | ICD-10-CM | POA: Diagnosis not present

## 2022-05-11 LAB — POC COVID19 BINAXNOW: SARS Coronavirus 2 Ag: NEGATIVE

## 2022-05-11 NOTE — Patient Instructions (Addendum)
Return in about 2 weeks (around 05/25/2022), or if symptoms worsen or fail to improve.        Great to see you today.  I have refilled the medication(s) we provide.   If labs were collected, we will inform you of lab results once received either by echart message or telephone call.   - echart message- for normal results that have been seen by the patient already.   - telephone call: abnormal results or if patient has not viewed results in their echart.   Temporomandibular Joint Syndrome  Temporomandibular joint syndrome (TMJ syndrome) is a condition that causes pain in the temporomandibular joints. These joints are located near your ears and allow your jaw to open and close. For people with TMJ syndrome, chewing, biting, or other movements of the jaw can be difficult or painful. TMJ syndrome is often mild and goes away within a few weeks. However, sometimes the condition becomes a long-term (chronic) problem. What are the causes? This condition may be caused by: Grinding your teeth or clenching your jaw. Some people do this when they are stressed. Arthritis. An injury to the jaw. A head or neck injury. Teeth or dentures that are not aligned well. In some cases, the cause of TMJ syndrome may not be known. What are the signs or symptoms? The most common symptom of this condition is aching pain on the side of the head in the area of the TMJ. Other symptoms may include: Pain when moving your jaw, such as when chewing or biting. Not being able to open your jaw all the way. Making a clicking sound when you open your mouth. Headache. Earache. Neck or shoulder pain. How is this diagnosed? This condition may be diagnosed based on: Your symptoms and medical history. A physical exam. Your health care provider may check the range of motion of your jaw. Imaging tests, such as X-rays or an MRI. You may also need to see your dentist, who will check if your teeth and jaw are lined up  correctly. How is this treated? TMJ syndrome often goes away on its own. If treatment is needed, it may include: Eating soft foods and applying ice or heat. Medicines to relieve pain or inflammation. Medicines or massage to relax the muscles. A splint, bite plate, or mouthpiece to prevent teeth grinding or jaw clenching. Relaxation techniques or counseling to help reduce stress. A therapy for pain in which an electrical current is applied to the nerves through the skin (transcutaneous electrical nerve stimulation). Acupuncture. This may help to relieve pain. Jaw surgery. This is rarely needed. Follow these instructions at home:  Eating and drinking Eat a soft diet if you are having trouble chewing. Avoid foods that require a lot of chewing. Do not chew gum. General instructions Take over-the-counter and prescription medicines only as told by your health care provider. If directed, put ice on the painful area. To do this: Put ice in a plastic bag. Place a towel between your skin and the bag. Leave the ice on for 20 minutes, 2-3 times a day. Remove the ice if your skin turns bright red. This is very important. If you cannot feel pain, heat, or cold, you have a greater risk of damage to the area. Apply a warm, wet cloth (warm compress) to the painful area as told. Massage your jaw area and do any jaw stretching exercises as told by your health care provider. If you were given a splint, bite plate, or mouthpiece, wear  it as told by your health care provider. Keep all follow-up visits. This is important. Where to find more information [place]: https://avila-olson.com/ Contact a health care provider if: You have trouble eating. You have new or worsening symptoms. Get help right away if: Your jaw locks. Summary Temporomandibular joint syndrome (TMJ syndrome) is a condition that causes pain in the temporomandibular joints. These joints are located  near your ears and allow your jaw to open and close. TMJ syndrome is often mild and goes away within a few weeks. However, sometimes the condition becomes a long-term (chronic) problem. Symptoms include an aching pain on the side of the head in the area of the TMJ, pain when chewing or biting, and being unable to open your jaw all the way. You may also make a clicking sound when you open your mouth. TMJ syndrome often goes away on its own. If treatment is needed, it may include medicines to relieve pain, reduce inflammation, or relax the muscles. A splint, bite plate, or mouthpiece may also be used to prevent teeth grinding or jaw clenching. This information is not intended to replace advice given to you by your health care provider. Make sure you discuss any questions you have with your health care provider. Document Revised: 11/24/2020 Document Reviewed: 11/24/2020 Elsevier Patient Education  [place].

## 2022-05-11 NOTE — Progress Notes (Signed)
Maria Watkins , 25-Jan-1956, 67 y.o., female MRN: [id number] Patient Care Team    Relationship Specialty Notifications Start End  Ma Hillock, DO PCP - General Family Medicine  12/26/21   Juanita Craver, MD Consulting Physician Gastroenterology  01/18/18   Jerelyn Charles, MD Consulting Physician Obstetrics  01/18/18   Otelia Sergeant, OD Referring Physician   04/09/21     Chief Complaint  Patient presents with   B12 Injection   Ear Pain    Pt c/o some L ear pain, nasal congestion  x 1 week;      Subjective: Pt presents for an OV with complaints of ear pain of 1 week  duration.  Associated symptoms include nasal congestion and runny nose. . Very Mild Sneezing, cough with morning production only.  Left jaw clicking complaint - started 1 week ago. She does not recall injury or initial pain with chewing. 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      05/11/2022    9:31 AM 01/23/2022   10:35 AM 01/23/2022    9:27 AM 04/09/2021    1:05 PM 01/20/2021    5:48 PM  Depression screen PHQ 2/9  Decreased Interest 1 0 0 0 0  Down, Depressed, Hopeless 1 0 0 0 0  PHQ - 2 Score 2 0 0 0 0  Altered sleeping 1      Tired, decreased energy 1      Change in appetite 0      Feeling bad or failure about yourself  0      Trouble concentrating 0      Moving slowly or fidgety/restless 0      Suicidal thoughts 0      PHQ-9 Score 4        Allergies  Allergen Reactions   Terconazole Nausea Only   Fosamax [Alendronate] Diarrhea and Nausea Only   Social History   Social History Narrative   Not on file   Past Medical History:  Diagnosis Date   Abnormal Pap smear of cervix 1980's   resolved post cryo with normal follow up   Depression    Elevated alkaline phosphatase level 05/15/2019   Estrogen deficiency 05/15/2019   Gallstones 08/04/2012   GERD (gastroesophageal reflux disease) 11/09/2019   Hypertension    Lymphadenopathy 08/02/2019    Type 2 diabetes mellitus without complication, without long-term current use of insulin ([place]) 01/18/2018   Past Surgical History:  Procedure Laterality Date   CERVIX LESION DESTRUCTION  1980s   CHOLECYSTECTOMY N/A 01/13/2021   Procedure: LAPAROSCOPIC CHOLECYSTECTOMY WITH INTRAOPERATIVE CHOLANGIOGRAM;  Surgeon: Jovita Kussmaul, MD;  Location: [facility] OR;  Service: General;  Laterality: N/A;   COLPOSCOPY  31's   Family History  Problem Relation Age of Onset   Diabetes Mother    Arthritis Mother    COPD Father    Asthma Father    Heart disease Father    Diabetes Sister    Asthma Brother    COPD Sister    Rheum arthritis Sister    Pulmonary fibrosis Sister        Passed away 04/15/20   Breast cancer Maternal Aunt    Alcohol abuse Son    Allergies as of 05/11/2022       Reactions   Terconazole Nausea Only   Fosamax [alendronate] Diarrhea, Nausea Only        Medication List  Accurate as of May 11, 2022  9:42 AM. If you have any questions, ask your nurse or doctor.          cholecalciferol 25 MCG (1000 UNIT) tablet Commonly known as: VITAMIN D3 Take 2,000 Units by mouth daily.   cyanocobalamin 1000 MCG tablet Commonly known as: VITAMIN B12 Take 1,000 mcg by mouth daily.   fluticasone 50 MCG/ACT nasal spray Commonly known as: FLONASE Place 2 sprays into both nostrils in the morning and at bedtime.   lisinopril 40 MG tablet Commonly known as: ZESTRIL Take 1 tablet (40 mg total) by mouth daily.   Loratadine 10 MG Chew Chew 10 mg by mouth daily as needed for allergies.   metFORMIN 1000 MG tablet Commonly known as: GLUCOPHAGE Take 1 tablet (1,000 mg total) by mouth daily with breakfast.        All past medical history, surgical history, allergies, family history, immunizations andmedications were updated in the EMR today and reviewed under the history and medication portions of their EMR.     ROS Negative, with the exception of above mentioned in  HPI   Objective:  BP (!) 161/80   Pulse 64   Temp 97.7 F (36.5 C) (Oral)   Ht 5\' 2"  (1.575 m)   Wt 181 lb (82.1 kg)   LMP 04/27/2005 (Approximate)   SpO2 98%   BMI 33.11 kg/m  Body mass index is 33.11 kg/m. Physical Exam Vitals and nursing note reviewed.  Constitutional:      General: She is not in acute distress.    Appearance: Normal appearance. She is normal weight. She is not ill-appearing or toxic-appearing.  HENT:     Head: Normocephalic and atraumatic.     Comments: Left TMJ: click and left/lateral deviation with jaw extension.     Right Ear: Tympanic membrane, ear canal and external ear normal. There is no impacted cerumen.     Left Ear: Tympanic membrane, ear canal and external ear normal. There is no impacted cerumen.     Nose: No congestion or rhinorrhea.     Mouth/Throat:     Mouth: Mucous membranes are moist.     Pharynx: No oropharyngeal exudate or posterior oropharyngeal erythema.  Eyes:     General: No scleral icterus.       Right eye: No discharge.        Left eye: No discharge.     Extraocular Movements: Extraocular movements intact.     Conjunctiva/sclera: Conjunctivae normal.     Pupils: Pupils are equal, round, and reactive to light.  Cardiovascular:     Rate and Rhythm: Normal rate and regular rhythm.  Pulmonary:     Effort: Pulmonary effort is normal. No respiratory distress.     Breath sounds: Normal breath sounds. No wheezing, rhonchi or rales.  Skin:    Findings: No rash.  Neurological:     Mental Status: She is alert and oriented to person, place, and time. Mental status is at baseline.     Motor: No weakness.     Coordination: Coordination normal.     Gait: Gait normal.  Psychiatric:        Mood and Affect: Mood normal.        Behavior: Behavior normal.        Thought Content: Thought content normal.        Judgment: Judgment normal.      No results found. No results found. No results found for this or any previous visit (from  the past 24 hour(s)).  Assessment/Plan: Maria Watkins is a 67 y.o. female present for OV for  Arthralgia of left temporomandibular joint Discussed TMJ pain/referred pain Nsaids- naproxen once daily with food for 1 week.  Discuss with dental team. Bite abnormal from missing teeth. ENT exam is normal. .  B12 def: scheduled b12 injection provided today  Reviewed expectations re: course of current medical issues. Discussed self-management of symptoms. Outlined signs and symptoms indicating need for more acute intervention. Patient verbalized understanding and all questions were answered. Patient received an After-Visit Summary.    Orders Placed This Encounter  Procedures   POC COVID-19 [facility]   No orders of the defined types were placed in this encounter.  Referral Orders  No referral(s) requested today     Note is dictated utilizing voice recognition software. Although note has been proof read prior to signing, occasional typographical errors still can be missed. If any questions arise, please do not hesitate to call for verification.   electronically signed by:  Felix Pacini, DO  [facility] - OR

## 2022-06-09 ENCOUNTER — Ambulatory Visit (INDEPENDENT_AMBULATORY_CARE_PROVIDER_SITE_OTHER): Payer: PPO | Admitting: Family Medicine

## 2022-06-09 ENCOUNTER — Encounter: Payer: Self-pay | Admitting: Family Medicine

## 2022-06-09 ENCOUNTER — Ambulatory Visit (INDEPENDENT_AMBULATORY_CARE_PROVIDER_SITE_OTHER): Payer: PPO

## 2022-06-09 VITALS — BP 153/81 | HR 52 | Temp 98.2°F | Wt 182.6 lb

## 2022-06-09 DIAGNOSIS — M26622 Arthralgia of left temporomandibular joint: Secondary | ICD-10-CM | POA: Diagnosis not present

## 2022-06-09 DIAGNOSIS — H9202 Otalgia, left ear: Secondary | ICD-10-CM

## 2022-06-09 DIAGNOSIS — E538 Deficiency of other specified B group vitamins: Secondary | ICD-10-CM | POA: Diagnosis not present

## 2022-06-09 NOTE — Progress Notes (Signed)
Patient came today for monthly b12 injections doses. Patient was given b12 injection, IM left deltoid. Patient tolerated injection well.

## 2022-06-09 NOTE — Progress Notes (Signed)
Maria Watkins , 1955-06-03, 67 y.o., female MRN: [id number] Patient Care Team    Relationship Specialty Notifications Start End  Ma Hillock, DO PCP - General Family Medicine  12/26/21   Juanita Craver, MD Consulting Physician Gastroenterology  01/18/18   Jerelyn Charles, MD Consulting Physician Obstetrics  01/18/18   Otelia Sergeant, OD Referring Physician   04/09/21     Chief Complaint  Patient presents with   Temporomandibular Joint Pain    Dental does not cover treatment for tmj; what other options are there; soft food diet; had 1 episode of vertigo since Dx     Subjective:Maria Watkins is a 67 y.o. female present to discuss TMJ. Patient reports she checked with her dental plan and it does not cover her TMJ treatment.  She is wondering what is the best neck step for her.  She does report that since starting the naproxen for 7 days it did calm down the discomfort.  Unfortunately, the discomfort will return on occasions.  Prior note. Pt presents for an OV with complaints of ear pain of 1 week  duration.  Associated symptoms include nasal congestion and runny nose. . Very Mild Sneezing, cough with morning production only.  Left jaw clicking complaint - started 1 week ago. She does not recall injury or initial pain with chewing. 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      06/09/2022   10:26 AM 05/11/2022    9:31 AM 01/23/2022   10:35 AM 01/23/2022    9:27 AM 04/09/2021    1:05 PM  Depression screen PHQ 2/9  Decreased Interest 1 1 0 0 0  Down, Depressed, Hopeless 0 1 0 0 0  PHQ - 2 Score 1 2 0 0 0  Altered sleeping 1 1     Tired, decreased energy 1 1     Change in appetite 0 0     Feeling bad or failure about yourself  0 0     Trouble concentrating 1 0     Moving slowly or fidgety/restless 0 0     Suicidal thoughts 0 0     PHQ-9 Score 4 4       Allergies  Allergen Reactions   Terconazole Nausea Only   Fosamax  [Alendronate] Diarrhea and Nausea Only   Social History   Social History Narrative   Not on file   Past Medical History:  Diagnosis Date   Abnormal Pap smear of cervix 1980's   resolved post cryo with normal follow up   Depression    Elevated alkaline phosphatase level 05/15/2019   Estrogen deficiency 05/15/2019   Gallstones 08/04/2012   GERD (gastroesophageal reflux disease) 11/09/2019   Hypertension    Lymphadenopathy 08/02/2019   Type 2 diabetes mellitus without complication, without long-term current use of insulin ([place]) 01/18/2018   Past Surgical History:  Procedure Laterality Date   CERVIX LESION DESTRUCTION  1980s   CHOLECYSTECTOMY N/A 01/13/2021   Procedure: LAPAROSCOPIC CHOLECYSTECTOMY WITH INTRAOPERATIVE CHOLANGIOGRAM;  Surgeon: Jovita Kussmaul, MD;  Location: [facility] OR;  Service: General;  Laterality: N/A;   COLPOSCOPY  69's   Family History  Problem Relation Age of Onset   Diabetes Mother    Arthritis Mother    COPD Father    Asthma Father    Heart disease Father    Diabetes Sister    Asthma Brother    COPD  Sister    Rheum arthritis Sister    Pulmonary fibrosis Sister        Passed away 04-12-2020   Breast cancer Maternal Aunt    Alcohol abuse Son    Allergies as of 06/09/2022       Reactions   Terconazole Nausea Only   Fosamax [alendronate] Diarrhea, Nausea Only        Medication List        Accurate as of June 09, 2022 11:59 PM. If you have any questions, ask your nurse or doctor.          cholecalciferol 25 MCG (1000 UNIT) tablet Commonly known as: VITAMIN D3 Take 2,000 Units by mouth daily.   cyanocobalamin 1000 MCG tablet Commonly known as: VITAMIN B12 Take 1,000 mcg by mouth daily.   fluticasone 50 MCG/ACT nasal spray Commonly known as: FLONASE Place 2 sprays into both nostrils in the morning and at bedtime.   lisinopril 40 MG tablet Commonly known as: ZESTRIL Take 1 tablet (40 mg total) by mouth daily.   Loratadine 10  MG Chew Chew 10 mg by mouth daily as needed for allergies.   metFORMIN 1000 MG tablet Commonly known as: GLUCOPHAGE Take 1 tablet (1,000 mg total) by mouth daily with breakfast.        All past medical history, surgical history, allergies, family history, immunizations andmedications were updated in the EMR today and reviewed under the history and medication portions of their EMR.     ROS Negative, with the exception of above mentioned in HPI   Objective:  BP (!) 153/81   Pulse (!) 52   Temp 98.2 F (36.8 C)   Wt 182 lb 9.6 oz (82.8 kg)   LMP 04/27/2005 (Approximate)   SpO2 100%   BMI 33.40 kg/m  Body mass index is 33.4 kg/m. Physical Exam Vitals and nursing note reviewed.  Constitutional:      General: She is not in acute distress.    Appearance: Normal appearance. She is normal weight. She is not ill-appearing or toxic-appearing.  HENT:     Head: Normocephalic and atraumatic.     Comments: Left TMJ: click and left/lateral deviation with jaw extension.  Eyes:     General: No scleral icterus.       Right eye: No discharge.        Left eye: No discharge.     Extraocular Movements: Extraocular movements intact.     Conjunctiva/sclera: Conjunctivae normal.     Pupils: Pupils are equal, round, and reactive to light.  Skin:    Findings: No rash.  Neurological:     Mental Status: She is alert and oriented to person, place, and time. Mental status is at baseline.     Motor: No weakness.     Coordination: Coordination normal.     Gait: Gait normal.  Psychiatric:        Mood and Affect: Mood normal.        Behavior: Behavior normal.        Thought Content: Thought content normal.        Judgment: Judgment normal.      No results found. No results found. No results found for this or any previous visit (from the past 24 hour(s)).  Assessment/Plan: Maria Watkins is a 67 y.o. female present for OV for  Arthralgia of left temporomandibular joint Discussed TMJ  pain/referred pain Nsaids-okay to use for discomfort Encouraged her to look into correcting her bite.  She is missing her posterior left lower teeth, which is likely causing her bite to be offset and causing her TMJ discomfort. In the meantime, I encouraged her to avoid foods that are difficult to chew or extra chewy.   TMJ AVS was provided for patient.  Would be happy to place a referral to an orthodontist or endodontist.  However this is normally done by dental team, and is not necessarily covered better if done by a family physician.  That being said patient is aware and if her insurance feels it would be better covered if the TMJ referral was placed by this provider I would be happy to do so for her without any additional follow-up.  Reviewed expectations re: course of current medical issues. Discussed self-management of symptoms. Outlined signs and symptoms indicating need for more acute intervention. Patient verbalized understanding and all questions were answered. Patient received an After-Visit Summary.    No orders of the defined types were placed in this encounter.  No orders of the defined types were placed in this encounter.  Referral Orders  No referral(s) requested today     Note is dictated utilizing voice recognition software. Although note has been proof read prior to signing, occasional typographical errors still can be missed. If any questions arise, please do not hesitate to call for verification.   electronically signed by:  Howard Pouch, DO  [place]

## 2022-06-09 NOTE — Patient Instructions (Signed)
Temporomandibular Joint Syndrome  Temporomandibular joint syndrome (TMJ syndrome) is a condition that causes pain in the temporomandibular joints. These joints are located near your ears and allow your jaw to open and close. For people with TMJ syndrome, chewing, biting, or other movements of the jaw can be difficult or painful. TMJ syndrome is often mild and goes away within a few weeks. However, sometimes the condition becomes a long-term (chronic) problem. What are the causes? This condition may be caused by: Grinding your teeth or clenching your jaw. Some people do this when they are stressed. Arthritis. An injury to the jaw. A head or neck injury. Teeth or dentures that are not aligned well. In some cases, the cause of TMJ syndrome may not be known. What are the signs or symptoms? The most common symptom of this condition is aching pain on the side of the head in the area of the TMJ. Other symptoms may include: Pain when moving your jaw, such as when chewing or biting. Not being able to open your jaw all the way. Making a clicking sound when you open your mouth. Headache. Earache. Neck or shoulder pain. How is this diagnosed? This condition may be diagnosed based on: Your symptoms and medical history. A physical exam. Your health care provider may check the range of motion of your jaw. Imaging tests, such as X-rays or an MRI. You may also need to see your dentist, who will check if your teeth and jaw are lined up correctly. How is this treated? TMJ syndrome often goes away on its own. If treatment is needed, it may include: Eating soft foods and applying ice or heat. Medicines to relieve pain or inflammation. Medicines or massage to relax the muscles. A splint, bite plate, or mouthpiece to prevent teeth grinding or jaw clenching. Relaxation techniques or counseling to help reduce stress. A therapy for pain in which an electrical current is applied to the nerves through the skin  (transcutaneous electrical nerve stimulation). Acupuncture. This may help to relieve pain. Jaw surgery. This is rarely needed. Follow these instructions at home:  Eating and drinking Eat a soft diet if you are having trouble chewing. Avoid foods that require a lot of chewing. Do not chew gum. General instructions Take over-the-counter and prescription medicines only as told by your health care provider. If directed, put ice on the painful area. To do this: Put ice in a plastic bag. Place a towel between your skin and the bag. Leave the ice on for 20 minutes, 2-3 times a day. Remove the ice if your skin turns bright red. This is very important. If you cannot feel pain, heat, or cold, you have a greater risk of damage to the area. Apply a warm, wet cloth (warm compress) to the painful area as told. Massage your jaw area and do any jaw stretching exercises as told by your health care provider. If you were given a splint, bite plate, or mouthpiece, wear it as told by your health care provider. Keep all follow-up visits. This is important. Where to find more information [place]: https://avila-olson.com/ Contact a health care provider if: You have trouble eating. You have new or worsening symptoms. Get help right away if: Your jaw locks. Summary Temporomandibular joint syndrome (TMJ syndrome) is a condition that causes pain in the temporomandibular joints. These joints are located near your ears and allow your jaw to open and close. TMJ syndrome is often mild and goes away within  a few weeks. However, sometimes the condition becomes a long-term (chronic) problem. Symptoms include an aching pain on the side of the head in the area of the TMJ, pain when chewing or biting, and being unable to open your jaw all the way. You may also make a clicking sound when you open your mouth. TMJ syndrome often goes away on its own. If treatment is needed, it may  include medicines to relieve pain, reduce inflammation, or relax the muscles. A splint, bite plate, or mouthpiece may also be used to prevent teeth grinding or jaw clenching. This information is not intended to replace advice given to you by your health care provider. Make sure you discuss any questions you have with your health care provider. Document Revised: 11/24/2020 Document Reviewed: 11/24/2020 Elsevier Patient Education  [place].

## 2022-06-25 DIAGNOSIS — Z7984 Long term (current) use of oral hypoglycemic drugs: Secondary | ICD-10-CM | POA: Diagnosis not present

## 2022-06-25 DIAGNOSIS — E119 Type 2 diabetes mellitus without complications: Secondary | ICD-10-CM | POA: Diagnosis not present

## 2022-06-25 LAB — HM DIABETES EYE EXAM

## 2022-07-02 ENCOUNTER — Telehealth: Payer: Self-pay

## 2022-07-02 NOTE — Telephone Encounter (Signed)
[facility] - Client TELEPHONE ADVICE RECORD [facility] Patient Name: Maria Watkins IDD INGS Gender: Female DOB: 09-25-1955 Age: 67 Y 13 M 25 D Return Phone Number: [phone] (Primary) Address: City/ State/ Zip: [place]  [address] Client [facility] - Client Client Site [facility] - Day Provider Raoul Pitch, [place] Contact Type Call Who Is Calling Patient / Member / Family / Caregiver Call Type Triage / Clinical Relationship To Patient Self Return Phone Number [phone] (Primary) Chief Complaint Blood Pressure High Reason for Call Symptomatic / Request for [place] states she has a question about her blood pressure cuff. She is on blood pressure medication for high blood pressure. States the top number is 180. Wants to know if it is normal when it is pumping air in the cuff for the top number to go over 200. Translation No Nurse Assessment Nurse: Lissa Merlin, RN, Abigail Date/Time (Eastern Time): 07/02/2022 12:11:09 PM Confirm and document reason for call. If symptomatic, describe symptoms. ---Caller states that she has been having high blood pressure and the top number has been 180 and it pumps up to 200. Last BP was 171/81. Has new glasses. no sob. Intermittent chest pain. Woke up having the dizziness. 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 Does the patient have any new or worsening symptoms? ---Yes Will a triage be completed? ---Yes Related visit to physician within the last 2 weeks? ---No Does the PT have any chronic conditions? (i.e. diabetes, asthma, this includes High risk factors for pregnancy, etc.) ---Yes List chronic conditions. ---TMJ HTN Pre-diabetes Is this a behavioral health or substance abuse call? ---No Guidelines Guideline Title Affirmed Question Affirmed Notes Nurse Date/Time (Eastern Time) Blood Pressure - High [id number] Systolic BP  >= 0000000 OR Diastolic >= [id number] AND [id number] cardiac (e.g., breathing Lissa Merlin, RN, Abigail 07/02/2022 12:20:32 PM PLEASE NOTE: All timestamps contained within this report are represented as [place] Standard Time. CONFIDENTIALTY NOTICE: This fax transmission is intended only for the addressee. It contains information that is legally privileged, confidential or otherwise protected from use or disclosure. If you are not the intended recipient, you are strictly prohibited from reviewing, disclosing, copying using or disseminating any of this information or taking any action in reliance on or regarding this information. If you have received this fax in error, please notify us immediately by telephone so that we can arrange for its return to [place]. Phone: [phone], Toll-Free: [phone], Fax: [phone] Page: 2 of 2 Call Id: [id number] Guidelines Guideline Title Affirmed Question Affirmed Notes Nurse Date/Time Eilene Ghazi Time) difficulty, chest pain) or neurologic symptoms (e.g., new-onset blurred or double vision, unsteady gait) Disp. Time Eilene Ghazi Time) Disposition Final User 07/02/2022 12:25:58 PM Go to ED Now Yes Lissa Merlin, RN, Abigail 07/02/2022 12:31:42 PM Send To RN Personal Lissa Merlin, RN, Abigail Final Disposition 07/02/2022 12:25:58 PM Go to ED Now Yes Lissa Merlin, RN, Leanne Lovely Disagree/Comply Disagree Caller Understands Yes PreDisposition InappropriateToAsk Care Advice Given Per Guideline GO TO ED NOW: * You need to be seen in the Emergency Department. * Go to the ED at ___________ [place] now. Drive carefully. CARE ADVICE given per High Blood Pressure (Adult) guideline. CALL EMS 911 IF: * Passes out or faints * Becomes confused * Becomes too weak to stand * You become worse Comments User: Tildon Husky, RN Date/Time (Eastern Time): 07/02/2022 12:53:15 PM Booked tomorrow for 1040 Referrals GO TO FACILITY REFUSED

## 2022-07-03 ENCOUNTER — Ambulatory Visit (INDEPENDENT_AMBULATORY_CARE_PROVIDER_SITE_OTHER): Payer: PPO | Admitting: Family Medicine

## 2022-07-03 VITALS — BP 155/67 | HR 60 | Temp 98.4°F | Wt 179.2 lb

## 2022-07-03 DIAGNOSIS — I10 Essential (primary) hypertension: Secondary | ICD-10-CM

## 2022-07-03 DIAGNOSIS — I517 Cardiomegaly: Secondary | ICD-10-CM

## 2022-07-03 DIAGNOSIS — I071 Rheumatic tricuspid insufficiency: Secondary | ICD-10-CM

## 2022-07-03 DIAGNOSIS — I340 Nonrheumatic mitral (valve) insufficiency: Secondary | ICD-10-CM | POA: Diagnosis not present

## 2022-07-03 DIAGNOSIS — I7789 Other specified disorders of arteries and arterioles: Secondary | ICD-10-CM

## 2022-07-03 MED ORDER — AMLODIPINE BESYLATE 2.5 MG PO TABS: 2.5000 mg | ORAL_TABLET | Freq: Every day | ORAL | 1 refills | Status: DC

## 2022-07-03 NOTE — Progress Notes (Signed)
Maria Watkins , Aug 03, 1955, 67 y.o., female MRN: [id number] Patient Care Team    Relationship Specialty Notifications Start End  Ma Hillock, DO PCP - General Family Medicine  12/26/21   Juanita Craver, MD Consulting Physician Gastroenterology  01/18/18   Jerelyn Charles, MD Consulting Physician Obstetrics  01/18/18   Otelia Sergeant, OD Referring Physician   04/09/21     Chief Complaint  Patient presents with   Hypertension    C/o Sternum soreness and left upper back (scapula area)     Subjective: MISCHEL Watkins is a 67 y.o. female present for blood pressure concerns  Essential hypertension/Morbid obesity (HCC)/palpitations Pt reports compliance with lisinopril 40. Blood pressures ranges at home 152-167/82-85. She started to check her BP after she noticed pain. She reports she did perform yard work prior to onset of pain.  She was raking her yard and putting down pine needles for the majority of the day on Tuesday.  Wednesday she woke up with the soreness midsternal and upper back.  She reports now the pain is more just a dull pain. She is taking ibuprofen for pain.  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 Patient denies chest pain, shortness of breath, dizziness or lower extremity edema.  Pt does not take ASA daily. Pt is not prescribed statin (patient declined). She is still drinking soda frequently and has not been watching the sodium in her diet.  She also states she can become more stressed at times.      06/09/2022   10:26 AM 05/11/2022    9:31 AM 01/23/2022   10:35 AM 01/23/2022    9:27 AM 04/09/2021    1:05 PM  Depression screen PHQ 2/9  Decreased Interest 1 1 0 0 0  Down, Depressed, Hopeless 0 1 0 0 0  PHQ - 2 Score 1 2 0 0 0  Altered sleeping 1 1     Tired, decreased energy 1 1     Change in appetite 0 0     Feeling bad or failure about yourself  0 0     Trouble concentrating 1 0     Moving slowly or  fidgety/restless 0 0     Suicidal thoughts 0 0     PHQ-9 Score 4 4       Allergies  Allergen Reactions   Terconazole Nausea Only   Fosamax [Alendronate] Diarrhea and Nausea Only   Social History   Social History Narrative   Not on file   Past Medical History:  Diagnosis Date   Abnormal Pap smear of cervix 1980's   resolved post cryo with normal follow up   Depression    Elevated alkaline phosphatase level 05/15/2019   Estrogen deficiency 05/15/2019   Gallstones 08/04/2012   GERD (gastroesophageal reflux disease) 11/09/2019   Hypertension    Lymphadenopathy 08/02/2019   Type 2 diabetes mellitus without complication, without long-term current use of insulin ([place]) 01/18/2018   Past Surgical History:  Procedure Laterality Date   CERVIX LESION DESTRUCTION  1980s   CHOLECYSTECTOMY N/A 01/13/2021   Procedure: LAPAROSCOPIC CHOLECYSTECTOMY WITH INTRAOPERATIVE CHOLANGIOGRAM;  Surgeon: Jovita Kussmaul, MD;  Location: [facility] OR;  Service: General;  Laterality: N/A;   COLPOSCOPY  1980's   Family History  Problem Relation Age of Onset   Diabetes Mother    Arthritis Mother    COPD Father    Asthma Father  Heart disease Father    Diabetes Sister    Asthma Brother    COPD Sister    Rheum arthritis Sister    Pulmonary fibrosis Sister        Passed away 2020-05-04   Breast cancer Maternal Aunt    Alcohol abuse Son    Allergies as of 07/03/2022       Reactions   Terconazole Nausea Only   Fosamax [alendronate] Diarrhea, Nausea Only        Medication List        Accurate as of July 03, 2022 11:22 AM. If you have any questions, ask your nurse or doctor.          amLODipine 2.5 MG tablet Commonly known as: NORVASC Take 1 tablet (2.5 mg total) by mouth daily.   cholecalciferol 25 MCG (1000 UNIT) tablet Commonly known as: VITAMIN D3 Take 2,000 Units by mouth daily.   cyanocobalamin 1000 MCG tablet Commonly known as: VITAMIN B12 Take 1,000 mcg by mouth daily.    fluticasone 50 MCG/ACT nasal spray Commonly known as: FLONASE Place 2 sprays into both nostrils in the morning and at bedtime.   lisinopril 40 MG tablet Commonly known as: ZESTRIL Take 1 tablet (40 mg total) by mouth daily.   Loratadine 10 MG Chew Chew 10 mg by mouth daily as needed for allergies.   metFORMIN 1000 MG tablet Commonly known as: GLUCOPHAGE Take 1 tablet (1,000 mg total) by mouth daily with breakfast.        All past medical history, surgical history, allergies, family history, immunizations andmedications were updated in the EMR today and reviewed under the history and medication portions of their EMR.     ROS Negative, with the exception of above mentioned in HPI   Objective:  BP (!) 155/67   Pulse 60   Temp 98.4 F (36.9 C)   Wt 179 lb 3.2 oz (81.3 kg)   LMP 04/27/2005 (Approximate)   SpO2 100%   BMI 32.78 kg/m  Body mass index is 32.78 kg/m. Physical Exam Vitals and nursing note reviewed.  Constitutional:      General: She is not in acute distress.    Appearance: Normal appearance. She is not ill-appearing, toxic-appearing or diaphoretic.  HENT:     Head: Normocephalic and atraumatic.  Eyes:     General: No scleral icterus.       Right eye: No discharge.        Left eye: No discharge.     Extraocular Movements: Extraocular movements intact.     Conjunctiva/sclera: Conjunctivae normal.     Pupils: Pupils are equal, round, and reactive to light.  Cardiovascular:     Rate and Rhythm: Normal rate and regular rhythm.  Pulmonary:     Effort: Pulmonary effort is normal. No respiratory distress.     Breath sounds: Normal breath sounds. No wheezing, rhonchi or rales.  Musculoskeletal:        General: Tenderness present.     Right lower leg: No edema.     Left lower leg: No edema.     Comments: TTP lower pectoralis on sternum and pectoralis insertion near axilla. TTP trapezius muscle group  Skin:    General: Skin is warm.     Findings: No rash.   Neurological:     Mental Status: She is alert and oriented to person, place, and time. Mental status is at baseline.     Motor: No weakness.     Gait: Gait  normal.  Psychiatric:        Mood and Affect: Mood normal.        Behavior: Behavior normal.        Thought Content: Thought content normal.        Judgment: Judgment normal.     No results found. No results found. No results found for this or any previous visit (from the past 24 hour(s)).  Assessment/Plan: CHRISS JANISH is a 67 y.o. female present for OV for  Essential hypertension/Hypercholesteremia Above goal. Continue lisinopril 40 mg qd Added amlodipine 2.5 mg qd Refrain from added salt and sodas Routine exercise. Stress management  Pectoralis strain: Reproducible pain along pectoralis and trapezius muscle groups. Rest. Heat.  ibuprofen as needed.   Reviewed expectations re: course of current medical issues. Discussed self-management of symptoms. Outlined signs and symptoms indicating need for more acute intervention. Patient verbalized understanding and all questions were answered. Patient received an After-Visit Summary.    No orders of the defined types were placed in this encounter.  Meds ordered this encounter  Medications   amLODipine (NORVASC) 2.5 MG tablet    Sig: Take 1 tablet (2.5 mg total) by mouth daily.    Dispense:  90 tablet    Refill:  1   Referral Orders  No referral(s) requested today     Note is dictated utilizing voice recognition software. Although note has been proof read prior to signing, occasional typographical errors still can be missed. If any questions arise, please do not hesitate to call for verification.   electronically signed by:  Howard Pouch, DO  [facility]

## 2022-07-08 ENCOUNTER — Ambulatory Visit (INDEPENDENT_AMBULATORY_CARE_PROVIDER_SITE_OTHER): Payer: PPO

## 2022-07-08 DIAGNOSIS — E538 Deficiency of other specified B group vitamins: Secondary | ICD-10-CM | POA: Diagnosis not present

## 2022-07-08 NOTE — Progress Notes (Signed)
Patient came today for monthly b12 injections doses. Patient was given b12 injection, IM left deltoid. Patient tolerated injection well.  

## 2022-07-18 IMAGING — DX DG FOOT COMPLETE 3+V*R*
3 series · 3 of 3 positions shown · non-contrast
Comparison: None.

CLINICAL DATA: First toe pain.

EXAM:
RIGHT FOOT COMPLETE - 3+ VIEW; RIGHT ANKLE - COMPLETE 3+ VIEW

[foot ap]
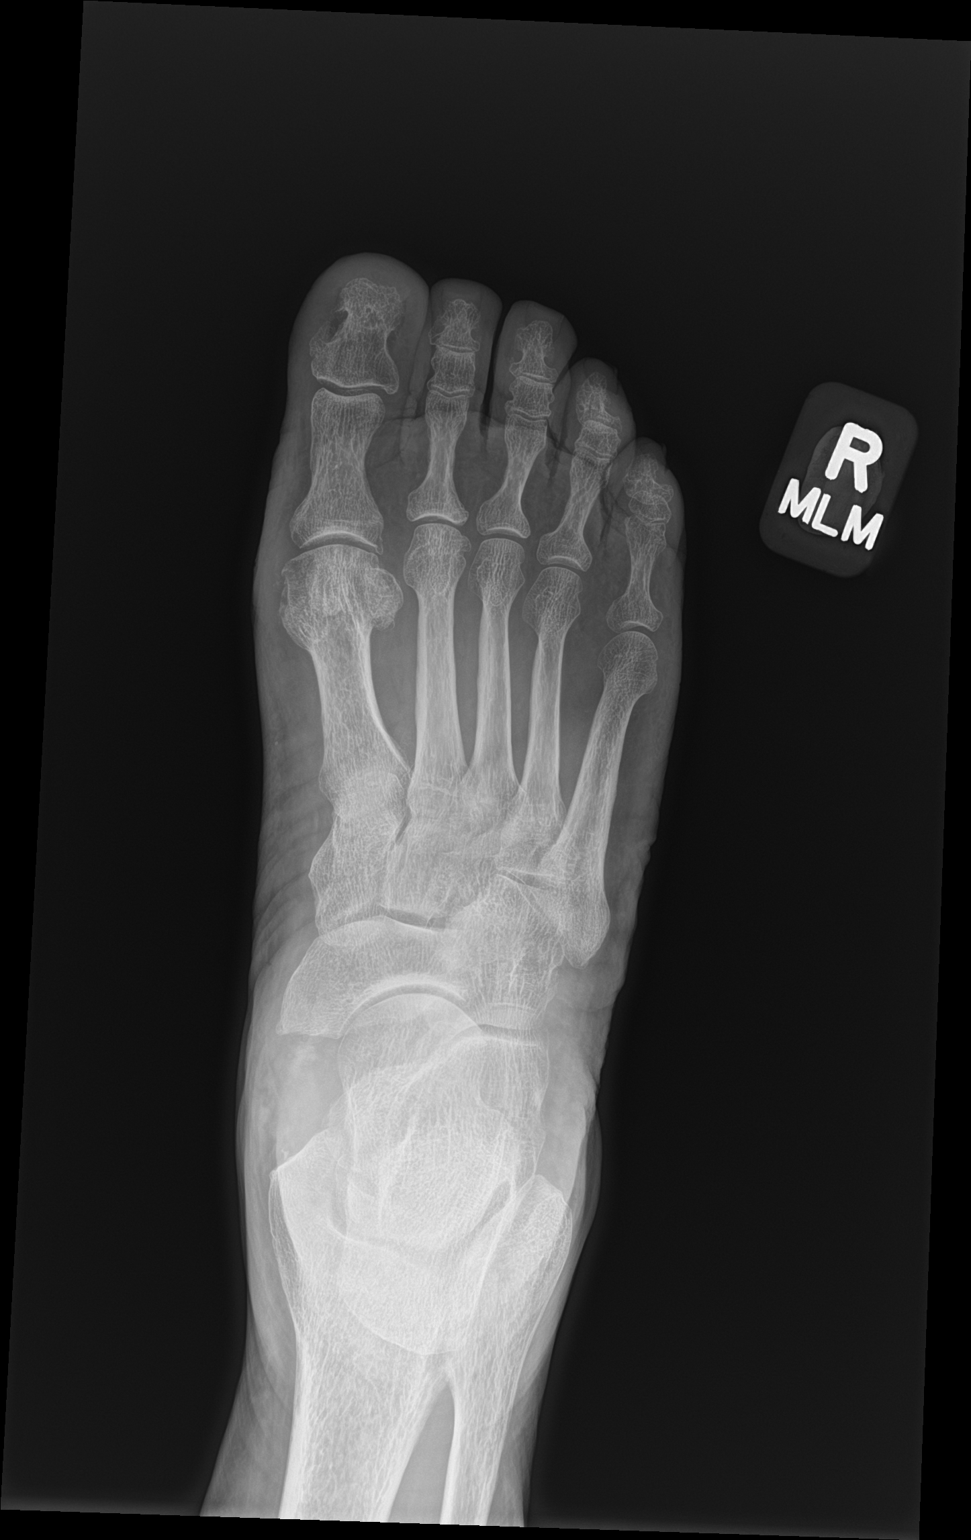

[foot obl]
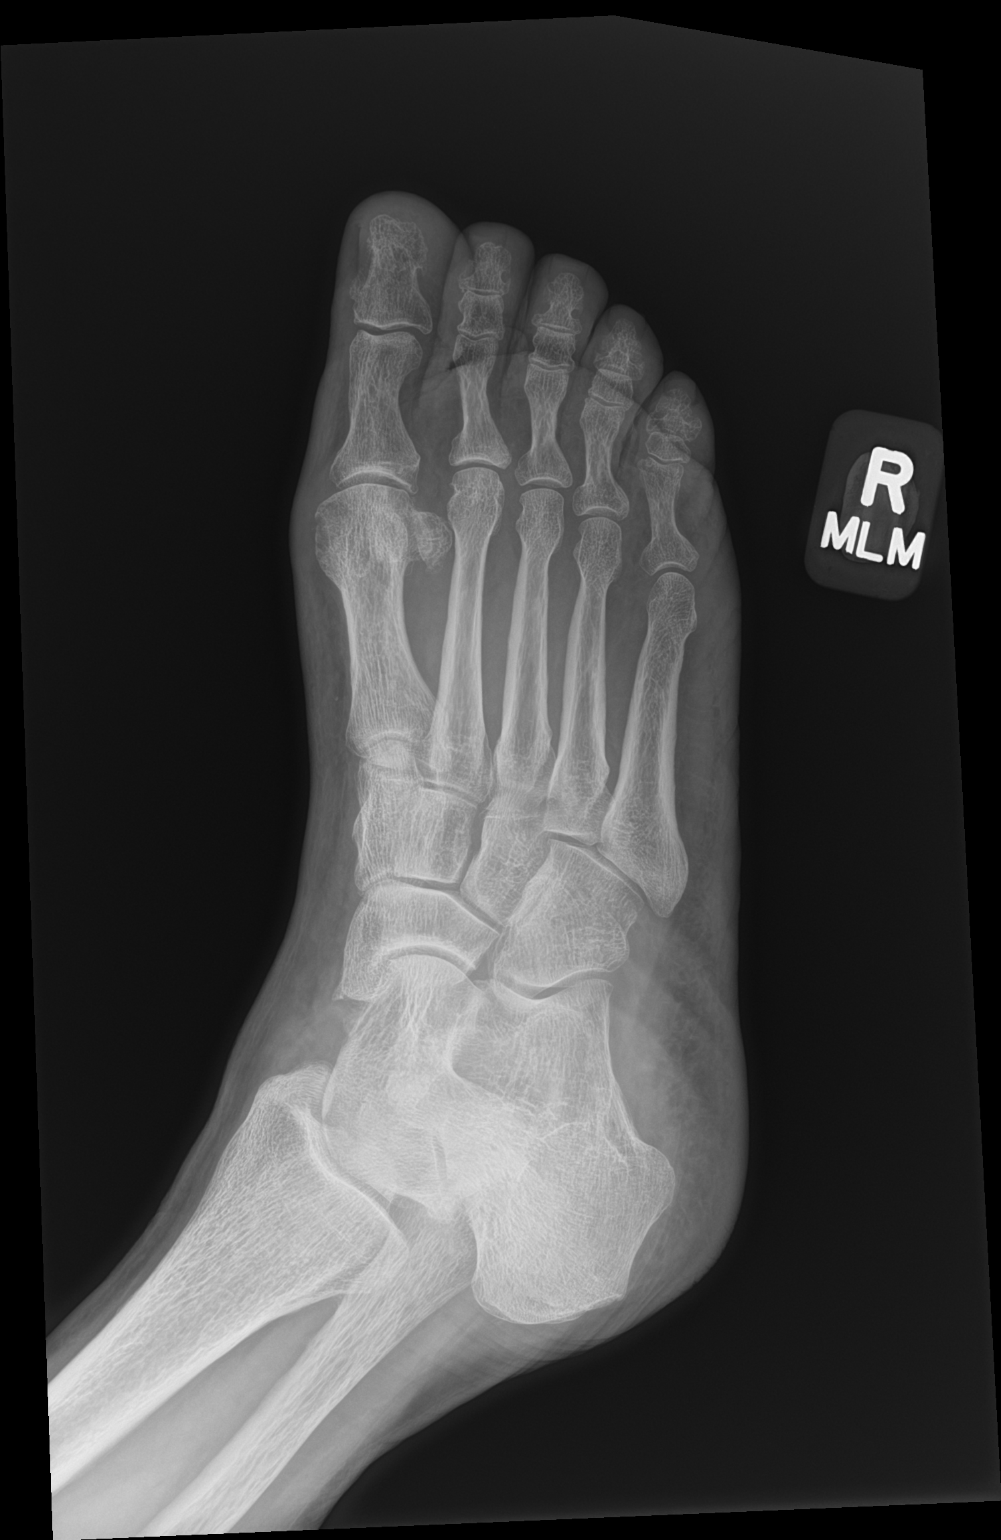

[foot lat]
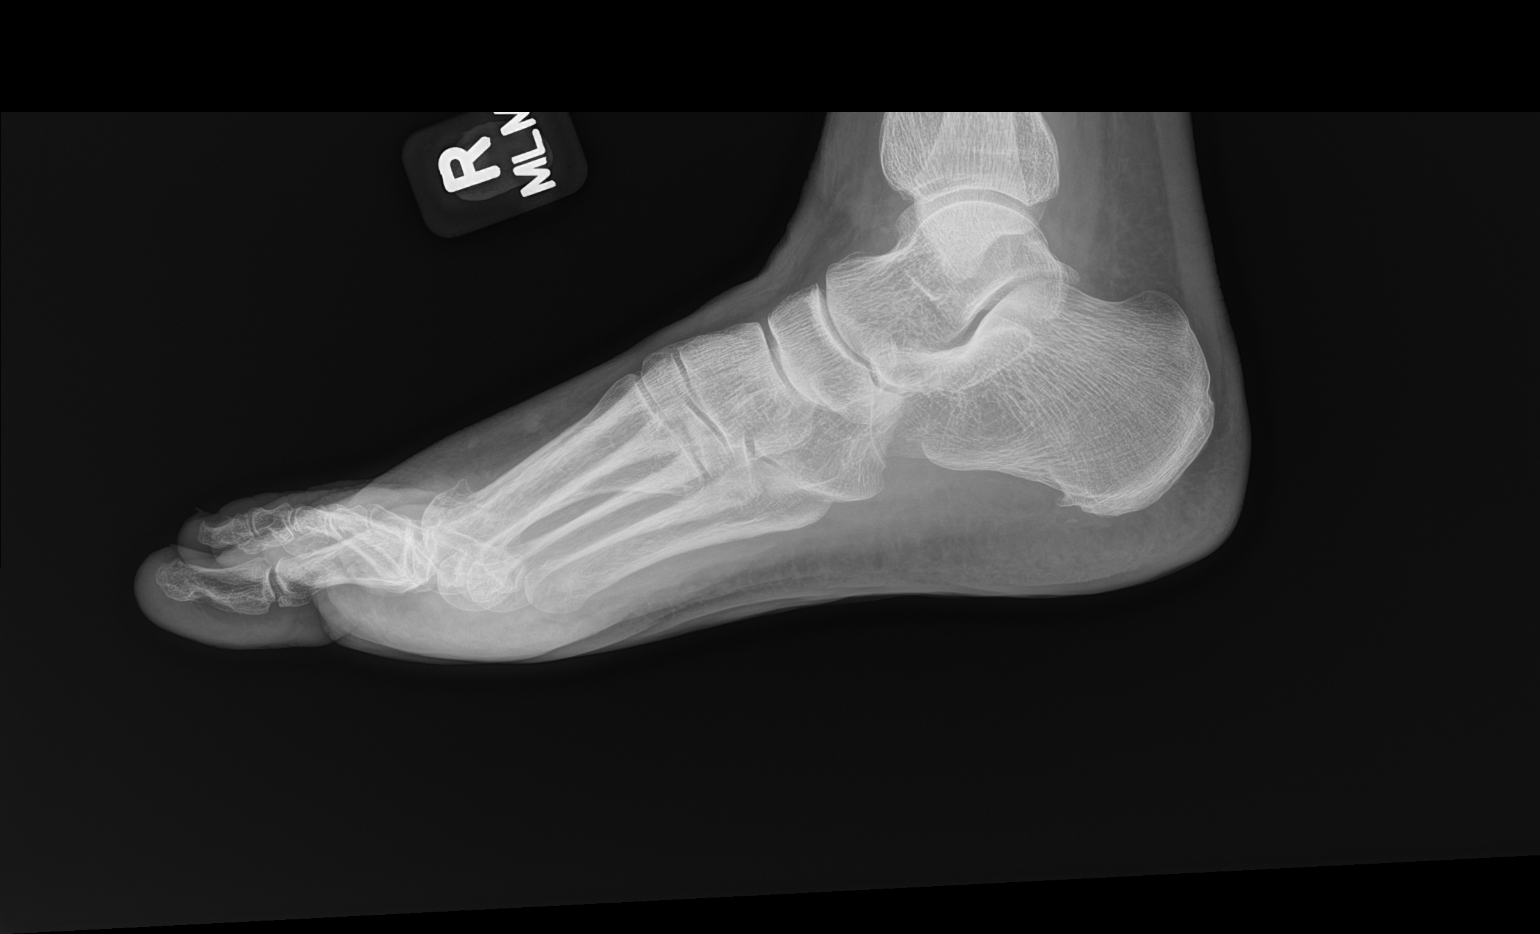

[3 of 3 positions shown; findings below may reference images not displayed]

FINDINGS: Right ankle: There is no evidence of fracture or dislocation. There
is no evidence of arthropathy or other focal bone abnormality. Soft
tissues are unremarkable.

Right foot: The bones are mildly osteopenic. There is no acute
fracture or dislocation. There are mild degenerative changes at the
first metatarsophalangeal joint with joint space narrowing and
osteophyte formation. Soft tissues are within normal limits.
IMPRESSION: 1. No acute fracture or dislocation of the right foot/right ankle.
2. Mild degenerative changes at the first metatarsophalangeal joint.

## 2022-08-04 ENCOUNTER — Telehealth: Payer: Self-pay | Admitting: Pharmacist

## 2022-08-04 NOTE — Telephone Encounter (Signed)
This patient is appearing on the insurance-provided list as failing the adherence measure for diabetes and hypertension (ACEi/ARB) medications for 2023.  Also noted to have type 2 but she has declined to take statin in past.   Hypertension:  Amlodipine 2.5mg  - takes 1 tablet daily. LR - 07/03/2022 90 days Lisinopril 40mg  take 1 tablet daily - LR 05/10/2022 - 90 days  Patient reports recent home blood pressure has been 138-142 / 65 to 75.  Blood pressure today was 138/82 and HR was 75  Diabetes:  Metformin 1000mg  - take 1 tablet each morning - LR 05/10/2022 - 90 days.  Last A1c was 7.3% (03/10/2022)  Hyperlipidemia:  Patient has declined statin therapy in past. She states her husband had issues with muscle aches in the past but he has been able to tolerate rosuvastatin 3 times per week  Last LDL was 124 and Tg was 177 (07/2021) - labs will be checked again at next OV with Dr Claiborne Billings 08/20/2022. If LDL is still > 100 consider trial of rosuvastatin.   Assessment  / Plan:  Hypertension - blood pressure improving since amlodipine added. Blood pressure goal < 140/90 Continue Amlodipine 2.5mg  daily and Lisinopril 40mg  daily.  Reminded patient the refill for lisinopril is due soon.  Discussed blood pressure goal Discussed continuing to limit intake of sodium Continue to check blood pressure 3 to 4 times per week and record  Diabetes - Last A1c slightly above goal of < 7.0% Continue Metformin 1000mg  - take 1 tablet each morning - reminded that RF is due soon  Hyperlipidemia: LDL not at goal of < 100.  Discussed benefits of statin therapy.  Labs will be checked again at next OV with Dr Claiborne Billings 08/20/2022. If LDL is still > 100 consider trial of rosuvastatin.

## 2022-08-10 ENCOUNTER — Ambulatory Visit (INDEPENDENT_AMBULATORY_CARE_PROVIDER_SITE_OTHER): Payer: PPO

## 2022-08-10 DIAGNOSIS — E538 Deficiency of other specified B group vitamins: Secondary | ICD-10-CM

## 2022-08-10 NOTE — Progress Notes (Signed)
Pt here for monthly B12 injection per    B12 1000mcg given IM, and pt tolerated injection well.   Next B12 injection scheduled for 1 month 

## 2022-08-19 ENCOUNTER — Encounter: Payer: Self-pay | Admitting: Family Medicine

## 2022-08-19 ENCOUNTER — Ambulatory Visit (INDEPENDENT_AMBULATORY_CARE_PROVIDER_SITE_OTHER): Payer: PPO | Admitting: Family Medicine

## 2022-08-19 VITALS — BP 152/78 | HR 60 | Temp 98.2°F | Ht 62.6 in | Wt 179.6 lb

## 2022-08-19 DIAGNOSIS — Z0000 Encounter for general adult medical examination without abnormal findings: Secondary | ICD-10-CM

## 2022-08-19 DIAGNOSIS — M85852 Other specified disorders of bone density and structure, left thigh: Secondary | ICD-10-CM

## 2022-08-19 DIAGNOSIS — I10 Essential (primary) hypertension: Secondary | ICD-10-CM | POA: Diagnosis not present

## 2022-08-19 DIAGNOSIS — E785 Hyperlipidemia, unspecified: Secondary | ICD-10-CM

## 2022-08-19 DIAGNOSIS — E538 Deficiency of other specified B group vitamins: Secondary | ICD-10-CM

## 2022-08-19 DIAGNOSIS — E1169 Type 2 diabetes mellitus with other specified complication: Secondary | ICD-10-CM | POA: Diagnosis not present

## 2022-08-19 DIAGNOSIS — E559 Vitamin D deficiency, unspecified: Secondary | ICD-10-CM | POA: Diagnosis not present

## 2022-08-19 DIAGNOSIS — Z7984 Long term (current) use of oral hypoglycemic drugs: Secondary | ICD-10-CM

## 2022-08-19 LAB — LIPID PANEL
Cholesterol: 164 mg/dL (ref 0–200)
HDL: 47.9 mg/dL (ref >39.00)
NonHDL: 116.55
Total CHOL/HDL Ratio: 3
Triglycerides: 253 mg/dL — ABNORMAL HIGH (ref 0.0–149.0)
VLDL: 50.6 mg/dL — ABNORMAL HIGH (ref 0.0–40.0)

## 2022-08-19 LAB — VITAMIN B12: Vitamin B-12: 612 pg/mL (ref 211–911)

## 2022-08-19 LAB — CBC WITH DIFFERENTIAL/PLATELET
Basophils Absolute: 0.1 10*3/uL (ref 0.0–0.1)
Basophils Relative: 1.3 % (ref 0.0–3.0)
Eosinophils Absolute: 0.1 10*3/uL (ref 0.0–0.7)
Eosinophils Relative: 2.7 % (ref 0.0–5.0)
HCT: 39.2 % (ref 36.0–46.0)
Hemoglobin: 13.3 g/dL (ref 12.0–15.0)
Lymphocytes Relative: 41.5 % (ref 12.0–46.0)
Lymphs Abs: 2.2 10*3/uL (ref 0.7–4.0)
MCHC: 34 g/dL (ref 30.0–36.0)
MCV: 87 fl (ref 78.0–100.0)
Monocytes Absolute: 0.5 10*3/uL (ref 0.1–1.0)
Monocytes Relative: 8.8 % (ref 3.0–12.0)
Neutro Abs: 2.4 10*3/uL (ref 1.4–7.7)
Neutrophils Relative %: 45.7 % (ref 43.0–77.0)
Platelets: 171 10*3/uL (ref 150.0–400.0)
RBC: 4.5 Mil/uL (ref 3.87–5.11)
RDW: 13.1 % (ref 11.5–15.5)
WBC: 5.2 10*3/uL (ref 4.0–10.5)

## 2022-08-19 LAB — COMPREHENSIVE METABOLIC PANEL
ALT: 12 U/L (ref 0–35)
AST: 15 U/L (ref 0–37)
Albumin: 4.3 g/dL (ref 3.5–5.2)
Alkaline Phosphatase: 112 U/L (ref 39–117)
BUN: 12 mg/dL (ref 6–23)
CO2: 26 mEq/L (ref 19–32)
Calcium: 9.5 mg/dL (ref 8.4–10.5)
Chloride: 104 mEq/L (ref 96–112)
Creatinine, Ser: 0.7 mg/dL (ref 0.40–1.20)
GFR: 88.64 mL/min (ref >60.00)
Glucose, Bld: 168 mg/dL — ABNORMAL HIGH (ref 70–99)
Potassium: 4 mEq/L (ref 3.5–5.1)
Sodium: 139 mEq/L (ref 135–145)
Total Bilirubin: 1.1 mg/dL (ref 0.2–1.2)
Total Protein: 7.3 g/dL (ref 6.0–8.3)

## 2022-08-19 LAB — VITAMIN D 25 HYDROXY (VIT D DEFICIENCY, FRACTURES): VITD: 27.49 ng/mL — ABNORMAL LOW (ref 30.00–100.00)

## 2022-08-19 LAB — HEMOGLOBIN A1C: Hgb A1c MFr Bld: 7.7 % — ABNORMAL HIGH (ref 4.6–6.5)

## 2022-08-19 LAB — MICROALBUMIN / CREATININE URINE RATIO
Creatinine,U: 91.6 mg/dL
Microalb Creat Ratio: 4.5 mg/g (ref 0.0–30.0)
Microalb, Ur: 4.1 mg/dL — ABNORMAL HIGH (ref 0.0–1.9)

## 2022-08-19 LAB — TSH: TSH: 1.26 u[IU]/mL (ref 0.35–5.50)

## 2022-08-19 LAB — LDL CHOLESTEROL, DIRECT: Direct LDL: 80 mg/dL

## 2022-08-19 MED ORDER — METFORMIN HCL 1000 MG PO TABS
1000.0000 mg | ORAL_TABLET | Freq: Every day | ORAL | 1 refills | Status: DC
Start: 2022-08-19 — End: 2023-01-14

## 2022-08-19 MED ORDER — LISINOPRIL 40 MG PO TABS: 40.0000 mg | ORAL_TABLET | Freq: Every day | ORAL | 1 refills | Status: DC

## 2022-08-19 MED ORDER — FLUTICASONE PROPIONATE 50 MCG/ACT NA SUSP: 2.0000 | Freq: Two times a day (BID) | NASAL | 6 refills | Status: DC

## 2022-08-19 MED ORDER — AMLODIPINE BESYLATE 2.5 MG PO TABS: 2.5000 mg | ORAL_TABLET | Freq: Every day | ORAL | 1 refills | Status: DC

## 2022-08-19 NOTE — Progress Notes (Signed)
Patient ID: Maria Watkins, female  DOB: 28-Apr-1955, 67 y.o.   MRN: [id number] Patient Care Team    Relationship Specialty Notifications Start End  Natalia Leatherwood, DO PCP - General Family Medicine  12/26/21   Charna Elizabeth, MD Consulting Physician Gastroenterology  01/18/18   Marlow Baars, MD Consulting Physician Obstetrics  01/18/18   Jill Side, OD Referring Physician   04/09/21     Chief Complaint  Patient presents with   Annual Exam    [facility]; pt is fasting; last b12 was 04/15; has not taken medication yet this morning    Subjective: Maria Watkins is a 67 y.o.  Female  present for CPE and Chronic Conditions/illness Management   All past medical history, surgical history, allergies, family history, immunizations, medications and social history were updated in the electronic medical record today. All recent labs, ED visits and hospitalizations within the last year were reviewed.  Health maintenance:  Colonoscopy: completed 08/27/2014, by Dr. Loreta Ave. follow up 10 yr. Mammogram: completed:01/2022 - Dr. Chestine Spore Immunizations: tdap UTD 2015, Influenza UTD 2023(encouraged yearly), PNA series 19/21, shingrix decliend Infectious disease screening: Hep C completed 2017 DEXA: last completed 08/2021, result osteopenia (-1.6) Patient has a Dental home. Hospitalizations/ED visits: reviewed  Essential hypertension/morbid obesity/palpitations/HLD/valve regurg/enlarged aorta Pt reports compliance with lisinopril 40 mg daily and amlodipine 2.5 mg daily. Patient denies chest pain, shortness of breath, dizziness or lower extremity edema.  She is now established with cardio.  Diet: Regular Exercise: Routine exercise RF: Hypertension, diabetes, obesity, former smoker, family history of heart disease   diabetes: Patient reports compliance with metformin 1000 mg qd.  Patient denies dizziness, hyperglycemic or hypoglycemic events. Patient denies numbness, tingling in the extremities or nonhealing  wounds of feet.   Seasonal allergies/seasonal allergies: Patient reports her allergies have been fairly well  Prior note: Pt presents for an OV with complaints of nasal congestion of 2 days duration.  Associated symptoms include waking up with congestion that gets better later in the day. Congestion when working in the yard.  She states she did cough up a small amount of yellow phlegm today.  Otherwise denies cough. She was treated for a sinus infection 09/17/2018 with amox x 10 days. She reports she has been taking claritin and Astelin nasal spray as well.  She endorses relief with her symptoms after completion of amoxicillin.  At that time she was having some sinus pain and pressure which has resolved with antibiotics.  He denies any fever, chills, nausea, vomit, sinus pain or teeth pain.      08/19/2022    8:01 AM 06/09/2022   10:26 AM 05/11/2022    9:31 AM 01/23/2022   10:35 AM 01/23/2022    9:27 AM  Depression screen PHQ 2/9  Decreased Interest 0 1 1 0 0  Down, Depressed, Hopeless 0 0 1 0 0  PHQ - 2 Score 0 1 2 0 0  Altered sleeping  1 1    Tired, decreased energy  1 1    Change in appetite  0 0    Feeling bad or failure about yourself   0 0    Trouble concentrating  1 0    Moving slowly or fidgety/restless  0 0    Suicidal thoughts  0 0    PHQ-9 Score  4 4        08/19/2022    8:02 AM 06/09/2022   10:26 AM  GAD 7 : Generalized Anxiety Score  Nervous, Anxious, on Edge 0 1  Control/stop worrying 1 1  Worry too much - different things 0 1  Trouble relaxing 0 0  Restless 0 0  Easily annoyed or irritable 0 1  Afraid - awful might happen 0 1  Total GAD 7 Score 1 5     Immunization History  Administered Date(s) Administered   Fluad Quad(high Dose 65+) 01/16/2019, 03/19/2021, 02/04/2022   Influenza Inj Mdck Quad With Preservative 12/27/2018   Influenza, High Dose Seasonal PF 01/16/2019, 02/20/2020   Influenza,inj,Quad PF,6+ Mos 01/18/2018   Moderna Sars-Covid-2 Vaccination  06/08/2019, 07/07/2019   Pneumococcal Conjugate-13 02/23/2020   Pneumococcal Polysaccharide-23 04/14/2018   Tdap 04/27/2013     Past Medical History:  Diagnosis Date   Abnormal Pap smear of cervix 1980's   resolved post cryo with normal follow up   Depression    Elevated alkaline phosphatase level 05/15/2019   Estrogen deficiency 05/15/2019   Gallstones 08/04/2012   GERD (gastroesophageal reflux disease) 11/09/2019   Hypertension    Lymphadenopathy 08/02/2019   Type 2 diabetes mellitus without complication, without long-term current use of insulin 01/18/2018   Allergies  Allergen Reactions   Terconazole Nausea Only   Fosamax [Alendronate] Diarrhea and Nausea Only   Past Surgical History:  Procedure Laterality Date   CERVIX LESION DESTRUCTION  1980s   CHOLECYSTECTOMY N/A 01/13/2021   Procedure: LAPAROSCOPIC CHOLECYSTECTOMY WITH INTRAOPERATIVE CHOLANGIOGRAM;  Surgeon: Griselda Miner, MD;  Location: [facility] OR;  Service: General;  Laterality: N/A;   COLPOSCOPY  1980's   Family History  Problem Relation Age of Onset   Diabetes Mother    Arthritis Mother    COPD Father    Asthma Father    Heart disease Father    Diabetes Sister    Asthma Brother    COPD Sister    Rheum arthritis Sister    Pulmonary fibrosis Sister        Passed away Apr 19, 2020  Breast cancer Maternal Aunt    Alcohol abuse Son    Social History   Social History Narrative   Not on file    Allergies as of 08/19/2022       Reactions   Terconazole Nausea Only   Fosamax [alendronate] Diarrhea, Nausea Only        Medication List        Accurate as of August 19, 2022  8:20 AM. If you have any questions, ask your nurse or doctor.          STOP taking these medications    Loratadine 10 MG Chew Stopped by: Felix Pacini, DO       TAKE these medications    amLODipine 2.5 MG tablet Commonly known as: NORVASC Take 1 tablet (2.5 mg total) by mouth daily.   cholecalciferol 25 MCG (1000  UNIT) tablet Commonly known as: VITAMIN D3 Take 2,000 Units by mouth daily.   cyanocobalamin 1000 MCG tablet Commonly known as: VITAMIN B12 Take 1,000 mcg by mouth daily.   fluticasone 50 MCG/ACT nasal spray Commonly known as: FLONASE Place 2 sprays into both nostrils in the morning and at bedtime.   lisinopril 40 MG tablet Commonly known as: ZESTRIL Take 1 tablet (40 mg total) by mouth daily.   metFORMIN 1000 MG tablet Commonly known as: GLUCOPHAGE Take 1 tablet (1,000 mg total) by mouth daily with breakfast.        All past medical history, surgical history, allergies, family history, immunizations andmedications were updated in the EMR  today and reviewed under the history and medication portions of their EMR.     Recent Results (from the past 2160 hour(s))  HM DIABETES EYE EXAM     Status: None   Collection Time: 06/25/22 11:20 AM  Result Value Ref Range   HM Diabetic Eye Exam No Retinopathy No Retinopathy     ROS 14 pt review of systems performed and negative (unless mentioned in an HPI)  Objective: BP (!) 152/78   Pulse 60   Temp 98.2 F (36.8 C)   Ht 5' 2.6" (1.59 m)   Wt 179 lb 9.6 oz (81.5 kg)   LMP 04/27/2005 (Approximate)   SpO2 99%   BMI 32.22 kg/m  Physical Exam Vitals and nursing note reviewed.  Constitutional:      General: She is not in acute distress.    Appearance: Normal appearance. She is obese. She is not ill-appearing or toxic-appearing.  HENT:     Head: Normocephalic and atraumatic.     Right Ear: Tympanic membrane, ear canal and external ear normal. There is no impacted cerumen.     Left Ear: Tympanic membrane, ear canal and external ear normal. There is no impacted cerumen.     Nose: No congestion or rhinorrhea.     Mouth/Throat:     Mouth: Mucous membranes are moist.     Pharynx: Oropharynx is clear. No oropharyngeal exudate or posterior oropharyngeal erythema.  Eyes:     General: No scleral icterus.       Right eye: No discharge.         Left eye: No discharge.     Extraocular Movements: Extraocular movements intact.     Conjunctiva/sclera: Conjunctivae normal.     Pupils: Pupils are equal, round, and reactive to light.  Cardiovascular:     Rate and Rhythm: Normal rate and regular rhythm.     Pulses: Normal pulses.     Heart sounds: Normal heart sounds. No murmur heard.    No friction rub. No gallop.  Pulmonary:     Effort: Pulmonary effort is normal. No respiratory distress.     Breath sounds: Normal breath sounds. No stridor. No wheezing, rhonchi or rales.  Chest:     Chest wall: No tenderness.  Abdominal:     General: Abdomen is flat. Bowel sounds are normal. There is no distension.     Palpations: Abdomen is soft. There is no mass.     Tenderness: There is no abdominal tenderness. There is no right CVA tenderness, left CVA tenderness, guarding or rebound.     Hernia: No hernia is present.  Musculoskeletal:        General: No swelling, tenderness or deformity. Normal range of motion.     Cervical back: Normal range of motion and neck supple. No rigidity or tenderness.     Right lower leg: No edema.     Left lower leg: No edema.  Lymphadenopathy:     Cervical: No cervical adenopathy.  Skin:    General: Skin is warm and dry.     Coloration: Skin is not jaundiced or pale.     Findings: No bruising, erythema, lesion or rash.  Neurological:     General: No focal deficit present.     Mental Status: She is alert and oriented to person, place, and time. Mental status is at baseline.     Cranial Nerves: No cranial nerve deficit.     Sensory: No sensory deficit.     Motor: No weakness.  Coordination: Coordination normal.     Gait: Gait normal.     Deep Tendon Reflexes: Reflexes normal.  Psychiatric:        Mood and Affect: Mood normal.        Behavior: Behavior normal.        Thought Content: Thought content normal.        Judgment: Judgment normal.      Assessment/plan: Maria Watkins is a 67  y.o. female present for CPE and chronic condition management Essential/hypertension/obesity/palpitations/murmur/WCS/Diastolic dysfx- mild/regurg/enlarged aorta Pt has not taken meds today- call in with BP after meds requested Continue lisinopril to 40 mg daily  Continue amlodipine 2.5 mg daily - DC HCTZ 25 mg qd last appt- pt reported "fatigue" with use- she will continue to monitor.  - her BP readings fluctuate frequently in response to stress/anxiety etc  - reassured her today, not to worry about BP unless < 100 systolic with symptoms or consistently > 140 systolic.  CBC, CMP, TSH and lipids collected today F/u 5.5 mos   Type 2 diabetes mellitus w/ manifestations Has been At goal < 7, but mildly increasing last appt Continue metformin to 1000 mg qd -She was encouraged to focus on her diet and exercise  PNA series: Pneumonia series completed Flu shot: Up-to-date (recommneded yearly) Foot exam: 08/15/2022 Eye exam: 05/2022 - Hemoglobin A1c collected today - Urine microalbumin-Creatinine with uACR collected today A1c: 6.7>> 6.7>> 6.3>> 6.5> 6.6> 7.3> 6.9>6.4>6.9> 7.3  B12 deficiency/vit d def: Continue 1000 mcg of sublingual B12/B12 injections-B12 levels collected today Continue vit d 4000u recommended.> vit d collected today  Osteopenia of neck of left femur - VITAMIN D 25 Hydroxy (Vit-D Deficiency, Fractures) -Bone density up-to-date 2023 (-1.6)  Breast cancer screening by mammogram Has completed at gyn  Routine general medical examination at a health care facility Patient was encouraged to exercise greater than 150 minutes a week. Patient was encouraged to choose a diet filled with fresh fruits and vegetables, and lean meats. AVS provided to patient today for education/recommendation on gender specific health and safety maintenance. Colonoscopy: completed 08/27/2014, by Dr. Loreta Ave. follow up 10 yr. Mammogram: completed:01/2022 - Dr. Chestine Spore Immunizations: tdap UTD 2015, Influenza UTD  2023(encouraged yearly), PNA series 19/21, shingrix decliend Infectious disease screening: Hep C completed 2017 DEXA: last completed 08/2021, result osteopenia (-1.6)  Return in about 24 weeks (around 02/03/2023) for Routine chronic condition follow-up.   Orders Placed This Encounter  Procedures   Urine Microalbumin w/creat. ratio   CBC w/Diff   Comp Met (CMET)   TSH   Lipid panel   Hemoglobin A1c   B12   Vitamin D (25 hydroxy)   No orders of the defined types were placed in this encounter.  Referral Orders  No referral(s) requested today     Electronically signed by: Felix Pacini, DO [facility] [place]

## 2022-08-19 NOTE — Patient Instructions (Signed)
Return in about 24 weeks (around 02/03/2023) for Routine chronic condition follow-up.        Great to see you today.  I have refilled the medication(s) we provide.   If labs were collected, we will inform you of lab results once received either by echart message or telephone call.   - echart message- for normal results that have been seen by the patient already.   - telephone call: abnormal results or if patient has not viewed results in their echart.

## 2022-08-20 ENCOUNTER — Telehealth: Payer: Self-pay | Admitting: Family Medicine

## 2022-08-20 MED ORDER — DAPAGLIFLOZIN PROPANEDIOL 5 MG PO TABS: 5.0000 mg | ORAL_TABLET | Freq: Every day | ORAL | 1 refills | Status: DC

## 2022-08-20 NOTE — Telephone Encounter (Signed)
Yes continue B12 supplementation as it currently is.  Levels are great and to maintain this level, she would need to continue with what we are presently doing.

## 2022-08-20 NOTE — Telephone Encounter (Signed)
Pt advised of results/recommendations. She wanted to confirm if she needs to continue with monthly b12 injections. Her last injection was 4/15.

## 2022-08-20 NOTE — Telephone Encounter (Signed)
LVM for pt regarding results. 

## 2022-08-20 NOTE — Telephone Encounter (Signed)
Please call patient Liver, kidney and thyroid function are normal Blood cell counts and electrolytes are normal Cholesterol panel is at goal. B12 has greatly improved and now 612, this is excellent continue current supplementations. Urine protein level ratios are normal Vitamin D is still mildly low at 27.5.  Would encourage her to add 1000 units to her current vitamin D supplementation.  Please update her med list with recommended dosage by asking her what she is currently taking and adding the 1000 units to it.  Thank you. A1c has increased again to 7.7.  Continue metformin and we will need to add on another medication to help lower her A1c back down below 7.    -I have called in a medication called Farxiga at 5 mg daily.  This medication also has the added benefit of kidney protection in diabetics.  It is a low-dose and it will not cause her sugars to go too low.   Follow-up in 4 months on chronic conditions, please make this appointment for her

## 2022-08-20 NOTE — Telephone Encounter (Signed)
Pt is aware of results and recommendations and is scheduled for 4 month  f/u. She is currently taking 1,000 units of Vit D but will increase an 1,000 per Dr. Claiborne Billings recommendations.

## 2022-08-21 ENCOUNTER — Ambulatory Visit (INDEPENDENT_AMBULATORY_CARE_PROVIDER_SITE_OTHER): Payer: PPO | Admitting: Family Medicine

## 2022-08-21 ENCOUNTER — Encounter: Payer: Self-pay | Admitting: Family Medicine

## 2022-08-21 VITALS — BP 149/73 | HR 67 | Temp 97.6°F

## 2022-08-21 DIAGNOSIS — J029 Acute pharyngitis, unspecified: Secondary | ICD-10-CM

## 2022-08-21 DIAGNOSIS — I10 Essential (primary) hypertension: Secondary | ICD-10-CM | POA: Diagnosis not present

## 2022-08-21 LAB — POCT RAPID STREP A (OFFICE): Rapid Strep A Screen: NEGATIVE

## 2022-08-21 LAB — POC COVID19 BINAXNOW: SARS Coronavirus 2 Ag: NEGATIVE

## 2022-08-21 MED ORDER — AMOXICILLIN 400 MG/5ML PO SUSR: 800.0000 mg | Freq: Two times a day (BID) | ORAL | 0 refills | Status: AC

## 2022-08-21 MED ORDER — AMLODIPINE BESYLATE 5 MG PO TABS: 5.0000 mg | ORAL_TABLET | Freq: Every day | ORAL | 1 refills | Status: DC

## 2022-08-21 NOTE — Patient Instructions (Addendum)
Return if symptoms worsen or fail to improve.  Call in May 6th with new blood pressure reading.       Great to see you today.  I have refilled the medication(s) we provide.   If labs were collected, we will inform you of lab results once received either by echart message or telephone call.   - echart message- for normal results that have been seen by the patient already.   - telephone call: abnormal results or if patient has not viewed results in their echart.

## 2022-08-21 NOTE — Progress Notes (Signed)
Maria Watkins , 1955-10-12, 67 y.o., female MRN: [id number] Patient Care Team    Relationship Specialty Notifications Start End  Natalia Leatherwood, DO PCP - General Family Medicine  12/26/21   Charna Elizabeth, MD Consulting Physician Gastroenterology  01/18/18   Marlow Baars, MD Consulting Physician Obstetrics  01/18/18   Jill Side, OD Referring Physician   04/09/21     Chief Complaint  Patient presents with   Sore Throat    Started wed night thurs morning     Subjective: Maria Watkins is a 67 y.o. Pt presents for an OV with complaints of sore throat of 3 days duration.  Associated symptoms include feeling fatigued and not herself today.  Mild left ear discomfort. She did have strep exposure in the family. She did not check her temperature, but states she felt like she had a temperature yesterday.  Tolerating p.o.     08/19/2022    8:01 AM 06/09/2022   10:26 AM 05/11/2022    9:31 AM 01/23/2022   10:35 AM 01/23/2022    9:27 AM  Depression screen PHQ 2/9  Decreased Interest 0 1 1 0 0  Down, Depressed, Hopeless 0 0 1 0 0  PHQ - 2 Score 0 1 2 0 0  Altered sleeping  1 1    Tired, decreased energy  1 1    Change in appetite  0 0    Feeling bad or failure about yourself   0 0    Trouble concentrating  1 0    Moving slowly or fidgety/restless  0 0    Suicidal thoughts  0 0    PHQ-9 Score  4 4      Allergies  Allergen Reactions   Terconazole Nausea Only   Fosamax [Alendronate] Diarrhea and Nausea Only   Social History   Social History Narrative   Not on file   Past Medical History:  Diagnosis Date   Abnormal Pap smear of cervix 1980's   resolved post cryo with normal follow up   Depression    Elevated alkaline phosphatase level 05/15/2019   Estrogen deficiency 05/15/2019   Gallstones 08/04/2012   GERD (gastroesophageal reflux disease) 11/09/2019   Hypertension    Lymphadenopathy 08/02/2019   Type 2 diabetes mellitus without complication, without long-term  current use of insulin (HCC) 01/18/2018   Past Surgical History:  Procedure Laterality Date   CERVIX LESION DESTRUCTION  1980s   CHOLECYSTECTOMY N/A 01/13/2021   Procedure: LAPAROSCOPIC CHOLECYSTECTOMY WITH INTRAOPERATIVE CHOLANGIOGRAM;  Surgeon: Griselda Miner, MD;  Location: [facility] OR;  Service: General;  Laterality: N/A;   COLPOSCOPY  1980's   Family History  Problem Relation Age of Onset   Diabetes Mother    Arthritis Mother    COPD Father    Asthma Father    Heart disease Father    Diabetes Sister    Asthma Brother    COPD Sister    Rheum arthritis Sister    Pulmonary fibrosis Sister        Passed away April 29, 2020  Breast cancer Maternal Aunt    Alcohol abuse Son    Allergies as of 08/21/2022       Reactions   Terconazole Nausea Only   Fosamax [alendronate] Diarrhea, Nausea Only        Medication List        Accurate as of August 21, 2022  3:15 PM. If you have any questions, ask your  nurse or doctor.          amLODipine 5 MG tablet Commonly known as: NORVASC Take 1 tablet (5 mg total) by mouth daily. What changed:  medication strength how much to take Changed by: Felix Pacini, DO   amoxicillin 400 MG/5ML suspension Commonly known as: AMOXIL Take 10 mLs (800 mg total) by mouth 2 (two) times daily for 10 days.   cholecalciferol 25 MCG (1000 UNIT) tablet Commonly known as: VITAMIN D3 Take 2,000 Units by mouth daily.   cyanocobalamin 1000 MCG tablet Commonly known as: VITAMIN B12 Take 1,000 mcg by mouth daily.   dapagliflozin propanediol 5 MG Tabs tablet Commonly known as: Farxiga Take 1 tablet (5 mg total) by mouth daily.   fluticasone 50 MCG/ACT nasal spray Commonly known as: FLONASE Place 2 sprays into both nostrils in the morning and at bedtime.   lisinopril 40 MG tablet Commonly known as: ZESTRIL Take 1 tablet (40 mg total) by mouth daily.   metFORMIN 1000 MG tablet Commonly known as: GLUCOPHAGE Take 1 tablet (1,000 mg total) by mouth  daily with breakfast.        All past medical history, surgical history, allergies, family history, immunizations andmedications were updated in the EMR today and reviewed under the history and medication portions of their EMR.     ROS Negative, with the exception of above mentioned in HPI   Objective:  BP (!) 149/73   Pulse 67   Temp 97.6 F (36.4 C)   LMP 04/27/2005 (Approximate)   SpO2 98%  There is no height or weight on file to calculate BMI. Physical Exam Vitals and nursing note reviewed.  Constitutional:      General: She is not in acute distress.    Appearance: Normal appearance. She is normal weight. She is not ill-appearing or toxic-appearing.  HENT:     Head: Normocephalic and atraumatic.     Right Ear: Tympanic membrane, ear canal and external ear normal.     Left Ear: Tympanic membrane, ear canal and external ear normal.     Nose: No congestion or rhinorrhea.     Mouth/Throat:     Mouth: Mucous membranes are moist.     Pharynx: Posterior oropharyngeal erythema present. No oropharyngeal exudate.  Eyes:     General: No scleral icterus.       Right eye: No discharge.        Left eye: No discharge.     Extraocular Movements: Extraocular movements intact.     Conjunctiva/sclera: Conjunctivae normal.     Pupils: Pupils are equal, round, and reactive to light.  Cardiovascular:     Rate and Rhythm: Normal rate and regular rhythm.  Pulmonary:     Effort: Pulmonary effort is normal. No respiratory distress.     Breath sounds: Normal breath sounds. No wheezing, rhonchi or rales.  Musculoskeletal:     Cervical back: No tenderness.  Lymphadenopathy:     Cervical: Cervical adenopathy (Right anterior cervical lymph node enlarged and tender) present.  Skin:    Findings: No rash.  Neurological:     Mental Status: She is alert and oriented to person, place, and time. Mental status is at baseline.     Motor: No weakness.     Coordination: Coordination normal.      Gait: Gait normal.  Psychiatric:        Mood and Affect: Mood normal.        Behavior: Behavior normal.  Thought Content: Thought content normal.        Judgment: Judgment normal.      No results found. No results found. Results for orders placed or performed in visit on 08/21/22 (from the past 24 hour(s))  POCT rapid strep A     Status: None   Collection Time: 08/21/22  3:15 PM  Result Value Ref Range   Rapid Strep A Screen Negative Negative  POC COVID-19 BinaxNow     Status: None   Collection Time: 08/21/22  3:15 PM  Result Value Ref Range   SARS Coronavirus 2 Ag Negative Negative    Assessment/Plan: Maria Watkins is a 67 y.o. female present for OV for  Pharyngitis, unspecified etiology - POCT rapid strep A - POC COVID-19 BinaxNow - Upper Respiratory Culture All POC testing negative today Did have strep exposure, there is erythema on exam and a right enlarged lymph node.  Elected to cover for strep while we wait on respiratory culture. Amoxicillin 800 twice daily  Essential hypertension Home pressure brought in today (she was asked to call in today with it) and it is still elevated above goal.  Increase amlodipine to 5 mg qd.  Maintain regular f/u and call in 1 week with pressure reading.   Reviewed expectations re: course of current medical issues. Discussed self-management of symptoms. Outlined signs and symptoms indicating need for more acute intervention. Patient verbalized understanding and all questions were answered. Patient received an After-Visit Summary.    Orders Placed This Encounter  Procedures   Upper Respiratory Culture   POCT rapid strep A   POC COVID-19 BinaxNow   Meds ordered this encounter  Medications   amLODipine (NORVASC) 5 MG tablet    Sig: Take 1 tablet (5 mg total) by mouth daily.    Dispense:  90 tablet    Refill:  1   amoxicillin (AMOXIL) 400 MG/5ML suspension    Sig: Take 10 mLs (800 mg total) by mouth 2 (two) times  daily for 10 days.    Dispense:  200 mL    Refill:  0   Referral Orders  No referral(s) requested today     Note is dictated utilizing voice recognition software. Although note has been proof read prior to signing, occasional typographical errors still can be missed. If any questions arise, please do not hesitate to call for verification.   electronically signed by:  Felix Pacini, DO  [facility] [place]

## 2022-08-24 ENCOUNTER — Telehealth: Payer: Self-pay | Admitting: Family Medicine

## 2022-08-24 LAB — CULTURE, UPPER RESPIRATORY
MICRO NUMBER:: 14880925
SPECIMEN QUALITY:: ADEQUATE

## 2022-08-24 NOTE — Telephone Encounter (Signed)
Patient reports that on Friday she denied picking it up [place]  due to the cost. She wanted  to inform Dr. Claiborne Billings she did not pick up the medication nor is  she taking it at this time.  She would like to know if she can just continue to take the metformin, exercise and eat better will this help? Please give the patient a call to discuss possible alternatives.

## 2022-08-24 NOTE — Telephone Encounter (Signed)
Spoke with patient regarding results/recommendations. Advised pt to call insurance to find out what may be covered better. Pt states she is in the "donut hole".

## 2022-08-25 NOTE — Telephone Encounter (Signed)
Pt is okay to start process. Please contact her to begin

## 2022-08-25 NOTE — Telephone Encounter (Signed)
Patient might qualify for medication assistance program with [place] to get [place]. I would be willing to reach out to patient to screen her for medication assistance program.  If she is approved there is a 30 day coupon she can use to get Marcelline Deist so she can try until medication assistance program arrives.   Sharona Rovner

## 2022-08-25 NOTE — Telephone Encounter (Signed)
LM for pt to return call to discuss.  

## 2022-08-25 NOTE — Telephone Encounter (Signed)
Ok for Pharmacy to help with medication assistance

## 2022-08-26 ENCOUNTER — Encounter: Payer: Self-pay | Admitting: Pharmacist

## 2022-08-26 ENCOUNTER — Other Ambulatory Visit: Payer: Self-pay | Admitting: Pharmacist

## 2022-08-26 NOTE — Telephone Encounter (Signed)
Patient called to state she is going to just purchase the medication out of pocket and start the medication tomorrow morning.

## 2022-08-26 NOTE — Progress Notes (Signed)
08/26/2022 Name: Maria Watkins MRN: [id number] DOB: 02-Apr-1956  Chief Complaint  Patient presents with   Medication Management   Diabetes   Hypertension         Maria Watkins is a 67 y.o. year old female who presented for a telephone visit.   They were referred to the pharmacist by a quality report for assistance in managing  complex medication regimen and medication costs.   Patient was noted on Medicare gap report for 2023 to have failed the following Medicare measures - [facility] (medication adherence in hypertension), MAD (medication adherence in diabetes) and SUPD (stain use in patient with diabetes)  Subjective:  Primary Care Provider: Natalia Leatherwood, DO ; Next Scheduled Visit: 12/21/2022  Medication Access/Adherence  Current Pharmacy:  [id number] - [place], [place] - [address] [place] [address] Phone: [phone] Fax: [phone]   Patient reports affordability concerns with their medications: Yes  - concerned about cost of Farxiga Patient reports access/transportation concerns to their pharmacy: No  Patient reports adherence concerns with their medications:  No     Adherence:  filled lisinopril 05/10/2022 and 4/13 /2024 for 90 days. 2023 adherence rate for lisinopril was 60-69%.  filled metformin 05/11/2022 and 08/08/2022 for 90 days. 2023 adherence rate for metformin was 60-69%   Diabetes:  Current medications: metforin 1000mg  once a day and Farxiga 5mg  daily (newly prescribed)   Patient reports she is concerned about the cost of Farxiga - 90 days is $94 and 30 days is $47. She did state she has decided to start [place] and picked up Rx for 90 days this morning.   Medications tried in the past: none   Hypertension:  Current medications: lisinopril 40mg  daily and amlodipine 5mg  daily  Medications previously tried: hydrochlorothiazide  BP Readings from Last 3 Encounters:   08/21/22 (!) 149/73  08/19/22 (!) 152/78  07/03/22 (!) 155/67      Hyperlipidemia/ASCVD Risk Reduction  Current lipid lowering medications: none  Patient has declined to take stain several times in the past.   Most recent LDL was 80.    Objective:  Lab Results  Component Value Date   HGBA1C 7.7 (H) 08/19/2022    Lab Results  Component Value Date   CREATININE 0.70 08/19/2022   BUN 12 08/19/2022   NA 139 08/19/2022   K 4.0 08/19/2022   CL 104 08/19/2022   CO2 26 08/19/2022    Lab Results  Component Value Date   CHOL 164 08/19/2022   HDL 47.90 08/19/2022   LDLCALC 124 (H) 08/14/2021   LDLDIRECT 80.0 08/19/2022   TRIG 253.0 (H) 08/19/2022   CHOLHDL 3 08/19/2022    Medications Reviewed Today     Reviewed by Henrene Pastor, RPH-CPP (Pharmacist) on 08/26/22 at 1257  Med List Status: <None>   Medication Order Taking? Sig Documenting Provider Last Dose Status Informant  amLODipine (NORVASC) 5 MG tablet [id number] Yes Take 1 tablet (5 mg total) by mouth daily. Kuneff, Renee A, DO Taking Active   amoxicillin (AMOXIL) 400 MG/5ML suspension [id number] Yes Take 10 mLs (800 mg total) by mouth 2 (two) times daily for 10 days. Kuneff, Renee A, DO Taking Active   cholecalciferol (VITAMIN D3) 25 MCG (1000 UT) tablet [id number] Yes Take 2,000 Units by mouth daily. [provider] Taking Active Self  cyanocobalamin (VITAMIN B12) injection 1,000 mcg [id number]   Kuneff, Renee A, DO  Active  dapagliflozin propanediol (FARXIGA) 5 MG TABS tablet [id number] Yes Take 1 tablet (5 mg total) by mouth daily. Kuneff, Renee A, DO Taking Active   fluticasone (FLONASE) 50 MCG/ACT nasal spray [id number] Yes Place 2 sprays into both nostrils in the morning and at bedtime. Kuneff, Renee A, DO Taking Active   lisinopril (ZESTRIL) 40 MG tablet [id number] Yes Take 1 tablet (40 mg total) by mouth daily. Kuneff, Renee A, DO Taking Active   metFORMIN (GLUCOPHAGE) 1000 MG tablet [id number] Yes Take 1  tablet (1,000 mg total) by mouth daily with breakfast. Claiborne Billings, Renee A, DO Taking Active   vitamin B-12 (CYANOCOBALAMIN) 1000 MCG tablet [id number]  Take 1,000 mcg by mouth daily. [provider]  Active Self              Assessment/Plan:   Diabetes: A1c currently > 7.0% - Reviewed goal A1c, goal fasting, and goal 2 hour post prandial glucose - Recommend to start Farxiga 5mg  daily and continue metformin 1000mg  daily.  - Screened for medication assistance program for [place]. Patient [place] criteria [place]. We discussed that she might reach Medicare coverage gap for 2024 and discuss potential cost. Provided patient 1 time 30 days free coupon she can use later in the year if needed.    Hypertension:Currently not at blood pressure goal - Reviewed long term cardiovascular and renal outcomes of uncontrolled blood pressure - Recommend to continue with plan to increase amlodipine to 5mg  daily. Continue lisinopril for blood pressure and renal benefits.     Hyperlipidemia/ASCVD Risk Reduction: LDL is currently < 100 without statin therapy.  - patient continues to decline statin therapy.   Follow Up Plan: will check back with patient to review adherence in 3 months.   Henrene Pastor, PharmD Clinical Pharmacist [facility]

## 2022-08-26 NOTE — Telephone Encounter (Signed)
noted 

## 2022-08-28 ENCOUNTER — Telehealth: Payer: Self-pay | Admitting: Family Medicine

## 2022-08-28 NOTE — Telephone Encounter (Signed)
Patient ask that someone give her a call to discuss recent medications she has started. She is not feeling well on them and would like to discuss concerns and side effects.  She believes mixing the most recent medications has cause her to not feel well. Please give the patient a call to discuss.

## 2022-08-28 NOTE — Telephone Encounter (Signed)
Pt has been feeling sick the last few days, wobbly on her legs similar to a drunk feeling,  her head feels heavy, stomach has been bothering her. Strep test was negative, vaginal itching off and on since started Amoxicillin.    Does she need to stop [place] and Amoxicillin?

## 2022-08-28 NOTE — Telephone Encounter (Signed)
Does not need to stop the amoxicillin.  Even if she does not have strep, she was still being treated for pharyngitis.  She feels she is getting a yeast infection, I would recommend she pick up an over-the-counter yeast treatment product.  If symptoms remain into next week, I would encourage her to make an appointment to follow-up to be evaluated

## 2022-08-28 NOTE — Telephone Encounter (Signed)
Pt advised of recommendations.  

## 2022-08-31 ENCOUNTER — Telehealth: Payer: Self-pay

## 2022-08-31 NOTE — Telephone Encounter (Signed)
Patient is having headaches with Amlodipine 2.5mg  - Dr. Claiborne Billings increased meds to 5mg  but she hasn't started taking 5mg  yet because of the headaches.  She has decided not to continue with [place].  She is going to try to decrease blood sugar on her on by losing weight, watching what she eats, etc.  Please call [phone]

## 2022-08-31 NOTE — Telephone Encounter (Signed)
Two days ago, she started getting headaches, dizziness and felt flushed. She stopped taking 5mg  dose 2 days ago, yesterday she did not take medication, only Lisinopril 40mg . She has not taking [place] in a couple of days due to stomach issues. Pt encouraged to schedule f/u to discuss concerns, pt scheduled.

## 2022-09-01 ENCOUNTER — Ambulatory Visit (INDEPENDENT_AMBULATORY_CARE_PROVIDER_SITE_OTHER): Payer: PPO | Admitting: Family Medicine

## 2022-09-01 ENCOUNTER — Encounter: Payer: Self-pay | Admitting: Family Medicine

## 2022-09-01 VITALS — BP 159/66 | HR 61 | Temp 97.5°F | Wt 175.2 lb

## 2022-09-01 DIAGNOSIS — I10 Essential (primary) hypertension: Secondary | ICD-10-CM

## 2022-09-01 DIAGNOSIS — E785 Hyperlipidemia, unspecified: Secondary | ICD-10-CM | POA: Diagnosis not present

## 2022-09-01 DIAGNOSIS — Z7984 Long term (current) use of oral hypoglycemic drugs: Secondary | ICD-10-CM | POA: Diagnosis not present

## 2022-09-01 DIAGNOSIS — E1169 Type 2 diabetes mellitus with other specified complication: Secondary | ICD-10-CM

## 2022-09-01 NOTE — Progress Notes (Deleted)
Maria Watkins , 12-29-1955, 67 y.o., female MRN: [id number] Patient Care Team    Relationship Specialty Notifications Start End  Natalia Leatherwood, DO PCP - General Family Medicine  12/26/21   Charna Elizabeth, MD Consulting Physician Gastroenterology  01/18/18   Marlow Baars, MD Consulting Physician Obstetrics  01/18/18   Jill Side, OD Referring Physician   04/09/21     No chief complaint on file.    Subjective: Maria Watkins is a 68 y.o. Pt presents for an OV with complaints of sore throat of 3 days duration.  Associated symptoms include feeling fatigued and not herself today.  Mild left ear discomfort. She did have strep exposure in the family. She did not check her temperature, but states she felt like she had a temperature yesterday.  Tolerating p.o.     08/19/2022    8:01 AM 06/09/2022   10:26 AM 05/11/2022    9:31 AM 01/23/2022   10:35 AM 01/23/2022    9:27 AM  Depression screen PHQ 2/9  Decreased Interest 0 1 1 0 0  Down, Depressed, Hopeless 0 0 1 0 0  PHQ - 2 Score 0 1 2 0 0  Altered sleeping  1 1    Tired, decreased energy  1 1    Change in appetite  0 0    Feeling bad or failure about yourself   0 0    Trouble concentrating  1 0    Moving slowly or fidgety/restless  0 0    Suicidal thoughts  0 0    PHQ-9 Score  4 4      Allergies  Allergen Reactions   Terconazole Nausea Only   Fosamax [Alendronate] Diarrhea and Nausea Only   Social History   Social History Narrative   Not on file   Past Medical History:  Diagnosis Date   Abnormal Pap smear of cervix 1980's   resolved post cryo with normal follow up   Depression    Elevated alkaline phosphatase level 05/15/2019   Estrogen deficiency 05/15/2019   Gallstones 08/04/2012   GERD (gastroesophageal reflux disease) 11/09/2019   Hypertension    Lymphadenopathy 08/02/2019   Type 2 diabetes mellitus without complication, without long-term current use of insulin (HCC) 01/18/2018   Past Surgical  History:  Procedure Laterality Date   CERVIX LESION DESTRUCTION  1980s   CHOLECYSTECTOMY N/A 01/13/2021   Procedure: LAPAROSCOPIC CHOLECYSTECTOMY WITH INTRAOPERATIVE CHOLANGIOGRAM;  Surgeon: Griselda Miner, MD;  Location: [facility] OR;  Service: General;  Laterality: N/A;   COLPOSCOPY  1980's   Family History  Problem Relation Age of Onset   Diabetes Mother    Arthritis Mother    COPD Father    Asthma Father    Heart disease Father    Diabetes Sister    Asthma Brother    COPD Sister    Rheum arthritis Sister    Pulmonary fibrosis Sister        Passed away 04/22/20  Breast cancer Maternal Aunt    Alcohol abuse Son    Allergies as of 09/01/2022       Reactions   Terconazole Nausea Only   Fosamax [alendronate] Diarrhea, Nausea Only        Medication List        Accurate as of Sep 01, 2022  7:50 AM. If you have any questions, ask your nurse or doctor.          amLODipine 5  MG tablet Commonly known as: NORVASC Take 1 tablet (5 mg total) by mouth daily.   cholecalciferol 25 MCG (1000 UNIT) tablet Commonly known as: VITAMIN D3 Take 2,000 Units by mouth daily.   cyanocobalamin 1000 MCG tablet Commonly known as: VITAMIN B12 Take 1,000 mcg by mouth daily.   dapagliflozin propanediol 5 MG Tabs tablet Commonly known as: Farxiga Take 1 tablet (5 mg total) by mouth daily.   fluticasone 50 MCG/ACT nasal spray Commonly known as: FLONASE Place 2 sprays into both nostrils in the morning and at bedtime.   lisinopril 40 MG tablet Commonly known as: ZESTRIL Take 1 tablet (40 mg total) by mouth daily.   metFORMIN 1000 MG tablet Commonly known as: GLUCOPHAGE Take 1 tablet (1,000 mg total) by mouth daily with breakfast.        All past medical history, surgical history, allergies, family history, immunizations andmedications were updated in the EMR today and reviewed under the history and medication portions of their EMR.     ROS Negative, with the exception of above  mentioned in HPI   Objective:  LMP 04/27/2005 (Approximate)  There is no height or weight on file to calculate BMI. Physical Exam Vitals and nursing note reviewed.  Constitutional:      General: She is not in acute distress.    Appearance: Normal appearance. She is normal weight. She is not ill-appearing or toxic-appearing.  HENT:     Head: Normocephalic and atraumatic.     Right Ear: Tympanic membrane, ear canal and external ear normal.     Left Ear: Tympanic membrane, ear canal and external ear normal.     Nose: No congestion or rhinorrhea.     Mouth/Throat:     Mouth: Mucous membranes are moist.     Pharynx: Posterior oropharyngeal erythema present. No oropharyngeal exudate.  Eyes:     General: No scleral icterus.       Right eye: No discharge.        Left eye: No discharge.     Extraocular Movements: Extraocular movements intact.     Conjunctiva/sclera: Conjunctivae normal.     Pupils: Pupils are equal, round, and reactive to light.  Cardiovascular:     Rate and Rhythm: Normal rate and regular rhythm.  Pulmonary:     Effort: Pulmonary effort is normal. No respiratory distress.     Breath sounds: Normal breath sounds. No wheezing, rhonchi or rales.  Musculoskeletal:     Cervical back: No tenderness.  Lymphadenopathy:     Cervical: Cervical adenopathy (Right anterior cervical lymph node enlarged and tender) present.  Skin:    Findings: No rash.  Neurological:     Mental Status: She is alert and oriented to person, place, and time. Mental status is at baseline.     Motor: No weakness.     Coordination: Coordination normal.     Gait: Gait normal.  Psychiatric:        Mood and Affect: Mood normal.        Behavior: Behavior normal.        Thought Content: Thought content normal.        Judgment: Judgment normal.      No results found. No results found. No results found for this or any previous visit (from the past 24 hour(s)).   Assessment/Plan: Maria Watkins  is a 67 y.o. female present for OV for  Pharyngitis, unspecified etiology - POCT rapid strep A - POC COVID-19 BinaxNow - Upper Respiratory Culture  All POC testing negative today Did have strep exposure, there is erythema on exam and a right enlarged lymph node.  Elected to cover for strep while we wait on respiratory culture. Amoxicillin 800 twice daily  Essential hypertension Home pressure brought in today (she was asked to call in today with it) and it is still elevated above goal.  Increase amlodipine to 5 mg qd.  Maintain regular f/u and call in 1 week with pressure reading.   Reviewed expectations re: course of current medical issues. Discussed self-management of symptoms. Outlined signs and symptoms indicating need for more acute intervention. Patient verbalized understanding and all questions were answered. Patient received an After-Visit Summary.    No orders of the defined types were placed in this encounter.  No orders of the defined types were placed in this encounter.  Referral Orders  No referral(s) requested today     Note is dictated utilizing voice recognition software. Although note has been proof read prior to signing, occasional typographical errors still can be missed. If any questions arise, please do not hesitate to call for verification.   electronically signed by:  Felix Pacini, DO  [facility] [place]

## 2022-09-01 NOTE — Progress Notes (Signed)
Patient ID: Maria Watkins, female  DOB: 1955/08/12, 67 y.o.   MRN: [id number] Patient Care Team    Relationship Specialty Notifications Start End  Natalia Leatherwood, DO PCP - General Family Medicine  12/26/21   Charna Elizabeth, MD Consulting Physician Gastroenterology  01/18/18   Marlow Baars, MD Consulting Physician Obstetrics  01/18/18   Jill Side, OD Referring Physician   04/09/21     Chief Complaint  Patient presents with   Dizziness    Decreased amlodipine. Stopped taking farxiga due to GI upset.states knees feel tight(sore) since starting medication.    Subjective: Maria Watkins is a 67 y.o.  Female  present for medication concerns  Essential hypertension/morbid obesity/palpitations/HLD/valve regurg/enlarged aorta Pt reports compliance  with lisinopril 40 mg daily and amlodipine 2.5 mg daily. She is now established with cardio. BP 120-130 at home.  Diet: Regular Exercise: Routine exercise RF: Hypertension, diabetes, obesity, former smoker, family history of heart disease   diabetes: Patient reports compliance with metformin 1000 mg qd.  Reports Farxiga caused her dizziness.  She does not want to start this medication at this time. Patient denies dizziness, hyperglycemic or hypoglycemic events. Patient denies numbness, tingling in the extremities or nonhealing wounds of feet.        08/19/2022    8:01 AM 06/09/2022   10:26 AM 05/11/2022    9:31 AM 01/23/2022   10:35 AM 01/23/2022    9:27 AM  Depression screen PHQ 2/9  Decreased Interest 0 1 1 0 0  Down, Depressed, Hopeless 0 0 1 0 0  PHQ - 2 Score 0 1 2 0 0  Altered sleeping  1 1    Tired, decreased energy  1 1    Change in appetite  0 0    Feeling bad or failure about yourself   0 0    Trouble concentrating  1 0    Moving slowly or fidgety/restless  0 0    Suicidal thoughts  0 0    PHQ-9 Score  4 4        08/19/2022    8:02 AM 06/09/2022   10:26 AM  GAD 7 : Generalized Anxiety Score  Nervous, Anxious, on  Edge 0 1  Control/stop worrying 1 1  Worry too much - different things 0 1  Trouble relaxing 0 0  Restless 0 0  Easily annoyed or irritable 0 1  Afraid - awful might happen 0 1  Total GAD 7 Score 1 5     Immunization History  Administered Date(s) Administered   Fluad Quad(high Dose 65+) 01/16/2019, 03/19/2021, 02/04/2022   Influenza Inj Mdck Quad With Preservative 12/27/2018   Influenza, High Dose Seasonal PF 01/16/2019, 02/20/2020   Influenza,inj,Quad PF,6+ Mos 01/18/2018   Moderna Sars-Covid-2 Vaccination 06/08/2019, 07/07/2019   Pneumococcal Conjugate-13 02/23/2020   Pneumococcal Polysaccharide-23 04/14/2018   Tdap 04/27/2013     Past Medical History:  Diagnosis Date   Abnormal Pap smear of cervix 1980's   resolved post cryo with normal follow up   Depression    Elevated alkaline phosphatase level 05/15/2019   Estrogen deficiency 05/15/2019   Gallstones 08/04/2012   GERD (gastroesophageal reflux disease) 11/09/2019   Hypertension    Lymphadenopathy 08/02/2019   Type 2 diabetes mellitus without complication, without long-term current use of insulin (HCC) 01/18/2018   Allergies  Allergen Reactions   Terconazole Nausea Only   Fosamax [Alendronate] Diarrhea and Nausea Only   Past Surgical History:  Procedure Laterality  Date   CERVIX LESION DESTRUCTION  1980s   CHOLECYSTECTOMY N/A 01/13/2021   Procedure: LAPAROSCOPIC CHOLECYSTECTOMY WITH INTRAOPERATIVE CHOLANGIOGRAM;  Surgeon: Griselda Miner, MD;  Location[facility] OR;  Service: General;  Laterality: N/A;   COLPOSCOPY  1980's   Family History  Problem Relation Age of Onset   Diabetes Mother    Arthritis Mother    COPD Father    Asthma Father    Heart disease Father    Diabetes Sister    Asthma Brother    COPD Sister    Rheum arthritis Sister    Pulmonary fibrosis Sister        Passed away 05/06/2020  Breast cancer Maternal Aunt    Alcohol abuse Son    Social History   Social History Narrative   Not on  file    Allergies as of 09/01/2022       Reactions   Terconazole Nausea Only   Fosamax [alendronate] Diarrhea, Nausea Only        Medication List        Accurate as of Sep 01, 2022  2:32 PM. If you have any questions, ask your nurse or doctor.          amLODipine 5 MG tablet Commonly known as: NORVASC Take 1 tablet (5 mg total) by mouth daily. What changed: how much to take   cholecalciferol 25 MCG (1000 UNIT) tablet Commonly known as: VITAMIN D3 Take 2,000 Units by mouth daily.   cyanocobalamin 1000 MCG tablet Commonly known as: VITAMIN B12 Take 1,000 mcg by mouth daily.   dapagliflozin propanediol 5 MG Tabs tablet Commonly known as: Farxiga Take 1 tablet (5 mg total) by mouth daily.   fluticasone 50 MCG/ACT nasal spray Commonly known as: FLONASE Place 2 sprays into both nostrils in the morning and at bedtime.   lisinopril 40 MG tablet Commonly known as: ZESTRIL Take 1 tablet (40 mg total) by mouth daily.   metFORMIN 1000 MG tablet Commonly known as: GLUCOPHAGE Take 1 tablet (1,000 mg total) by mouth daily with breakfast.        All past medical history, surgical history, allergies, family history, immunizations andmedications were updated in the EMR today and reviewed under the history and medication portions of their EMR.        ROS 14 pt review of systems performed and negative (unless mentioned in an HPI)  Objective: BP (!) 159/66   Pulse 61   Temp (!) 97.5 F (36.4 C)   Wt 175 lb 3.2 oz (79.5 kg)   LMP 04/27/2005 (Approximate)   SpO2 99%   BMI 31.44 kg/m  Physical Exam Vitals and nursing note reviewed.  Constitutional:      General: She is not in acute distress.    Appearance: Normal appearance. She is normal weight. She is not ill-appearing or toxic-appearing.  HENT:     Head: Normocephalic and atraumatic.  Eyes:     General: No scleral icterus.       Right eye: No discharge.        Left eye: No discharge.     Extraocular  Movements: Extraocular movements intact.     Conjunctiva/sclera: Conjunctivae normal.     Pupils: Pupils are equal, round, and reactive to light.  Cardiovascular:     Rate and Rhythm: Normal rate and regular rhythm.  Skin:    Findings: No rash.  Neurological:     Mental Status: She is alert and oriented to person, place, and  time. Mental status is at baseline.     Motor: No weakness.     Coordination: Coordination normal.     Gait: Gait normal.  Psychiatric:        Mood and Affect: Mood normal.        Behavior: Behavior normal.        Thought Content: Thought content normal.        Judgment: Judgment normal.      Assessment/plan: RONNYE CASAGRANDE is a 67 y.o. female present close medications Essential/hypertension/obesity/palpitations/murmur/WCS/Diastolic dysfx- mild/regurg/enlarged aorta Continue lisinopril to 40 mg daily  Continue amlodipine 2.5 mg daily did not tolerate higher dose.  Blood pressure regimen brought in today on medication seems to be working well for her at this dose. - her BP readings fluctuate frequently in response to stress/anxiety etc  - reassured her today, not to worry about BP unless < 100 systolic with symptoms or consistently > 140 systolic.  F/u 5.5 mos   Type 2 diabetes mellitus w/ manifestations Has been At goal < 7, but mildly increasing last appt Continue metformin to 1000 mg qd -She was encouraged to focus on her diet and exercise  PNA series: Pneumonia series completed Flu shot: Up-to-date (recommneded yearly) Foot exam: 08/15/2022 Eye exam: 05/2022 - Hemoglobin A1c collected today - Urine microalbumin-Creatinine with uACR collected today A1c: 6.7>> 6.7>> 6.3>> 6.5> 6.6> 7.3> 6.9>6.4>6.9> 7.3 Pt does not want to take farxiga d/t SE. This has been the case with new meds in general. She has anxiety surrounding medications. Lengthy conversation surrounding med.  She would like to increase her exercise and follow a diabetic diet.  She states that  she cannot get her A1c under control with the use of metformin and the behavioral changes, then she will start a medication this summer.  Patient has visit scheduled for follow-up in the summertime.   No orders of the defined types were placed in this encounter.  No orders of the defined types were placed in this encounter.  Referral Orders  No referral(s) requested today     Electronically signed by: Felix Pacini, DO [facility] [place]

## 2022-09-09 ENCOUNTER — Ambulatory Visit (INDEPENDENT_AMBULATORY_CARE_PROVIDER_SITE_OTHER): Payer: PPO

## 2022-09-09 DIAGNOSIS — E538 Deficiency of other specified B group vitamins: Secondary | ICD-10-CM | POA: Diagnosis not present

## 2022-09-09 NOTE — Progress Notes (Signed)
Patient came today for monthly b12 injections doses. Patient was given b12 injection, IM left deltoid. Patient tolerated injection well.   

## 2022-10-07 ENCOUNTER — Ambulatory Visit (INDEPENDENT_AMBULATORY_CARE_PROVIDER_SITE_OTHER): Payer: PPO

## 2022-10-07 DIAGNOSIS — E538 Deficiency of other specified B group vitamins: Secondary | ICD-10-CM | POA: Diagnosis not present

## 2022-10-07 NOTE — Progress Notes (Signed)
Patient came today for monthly b12 injections doses. Patient was given b12 injection, IM left deltoid. Patient tolerated injection well.   

## 2022-10-30 ENCOUNTER — Ambulatory Visit: Payer: PPO | Admitting: Cardiovascular Disease

## 2022-11-05 ENCOUNTER — Ambulatory Visit (INDEPENDENT_AMBULATORY_CARE_PROVIDER_SITE_OTHER): Payer: PPO

## 2022-11-05 DIAGNOSIS — E538 Deficiency of other specified B group vitamins: Secondary | ICD-10-CM

## 2022-11-05 NOTE — Progress Notes (Signed)
Pt here for monthly B12 injection per Dr.Kuneff   B12 1000mcg given IM, and pt tolerated injection well.   Next B12 injection scheduled for 1 month.       

## 2022-11-06 ENCOUNTER — Ambulatory Visit: Payer: PPO

## 2022-11-17 ENCOUNTER — Encounter: Payer: Self-pay | Admitting: Family Medicine

## 2022-11-17 ENCOUNTER — Ambulatory Visit (INDEPENDENT_AMBULATORY_CARE_PROVIDER_SITE_OTHER): Payer: PPO | Admitting: Family Medicine

## 2022-11-17 VITALS — BP 117/73 | HR 89 | Temp 97.7°F | Wt 172.8 lb

## 2022-11-17 DIAGNOSIS — Z803 Family history of malignant neoplasm of breast: Secondary | ICD-10-CM

## 2022-11-17 DIAGNOSIS — N644 Mastodynia: Secondary | ICD-10-CM

## 2022-11-17 HISTORY — DX: Family history of malignant neoplasm of breast: Z80.3

## 2022-11-17 NOTE — Progress Notes (Signed)
Maria Watkins , November 05, 1955, 67 y.o., female MRN: [id number] Patient Care Team    Relationship Specialty Notifications Start End  Natalia Leatherwood, DO PCP - General Family Medicine  12/26/21   Charna Elizabeth, MD Consulting Physician Gastroenterology  01/18/18   Marlow Baars, MD Consulting Physician Obstetrics  01/18/18   Jill Side, OD Referring Physician   04/09/21     Chief Complaint  Patient presents with   Breast Pain    C/o burning sensation and tenderness for the last month in left breast; mammogram not due until October 2024     Subjective: Maria Watkins is a 67 y.o. Pt presents for an OV with complaints of left breast pain of 1 month duration.  Patient reports she has growing concerns of the breast discomfort, considering her family history of breast cancer in 2 of her maternal aunts that passed away from the disease. She reports the entire left breast has a burning sensation that will even incorporate the nipple.  She points to an area on her breast around the 10 o'clock position of her left breast as a location of more intense discomfort.  She denies any palpable masses.  She denies any skin changes or palpable lymph nodes. She states she had her mammogram October 2023 at her gynecology office and it was normal.     09/09/2022   10:03 AM 08/19/2022    8:01 AM 06/09/2022   10:26 AM 05/11/2022    9:31 AM 01/23/2022   10:35 AM  Depression screen PHQ 2/9  Decreased Interest 0 0 1 1 0  Down, Depressed, Hopeless 0 0 0 1 0  PHQ - 2 Score 0 0 1 2 0  Altered sleeping   1 1   Tired, decreased energy   1 1   Change in appetite   0 0   Feeling bad or failure about yourself    0 0   Trouble concentrating   1 0   Moving slowly or fidgety/restless   0 0   Suicidal thoughts   0 0   PHQ-9 Score   4 4     Allergies  Allergen Reactions   Terconazole Nausea Only   Fosamax [Alendronate] Diarrhea and Nausea Only   Social History   Social History Narrative   Not on file    Past Medical History:  Diagnosis Date   Abnormal Pap smear of cervix 1980's   resolved post cryo with normal follow up   Depression    Elevated alkaline phosphatase level 05/15/2019   Estrogen deficiency 05/15/2019   Gallstones 08/04/2012   GERD (gastroesophageal reflux disease) 11/09/2019   Hypertension    Lymphadenopathy 08/02/2019   Type 2 diabetes mellitus without complication, without long-term current use of insulin (HCC) 01/18/2018   Past Surgical History:  Procedure Laterality Date   CERVIX LESION DESTRUCTION  1980s   CHOLECYSTECTOMY N/A 01/13/2021   Procedure: LAPAROSCOPIC CHOLECYSTECTOMY WITH INTRAOPERATIVE CHOLANGIOGRAM;  Surgeon: Griselda Miner, MD;  Location: [facility] OR;  Service: General;  Laterality: N/A;   COLPOSCOPY  1980's   Family History  Problem Relation Age of Onset   Diabetes Mother    Arthritis Mother    COPD Father    Asthma Father    Heart disease Father    Diabetes Sister    Asthma Brother    COPD Sister    Rheum arthritis Sister    Pulmonary fibrosis Sister  Passed away December 2021   Breast cancer Maternal Aunt    Alcohol abuse Son    Allergies as of 11/17/2022       Reactions   Terconazole Nausea Only   Fosamax [alendronate] Diarrhea, Nausea Only        Medication List        Accurate as of November 17, 2022  2:50 PM. If you have any questions, ask your nurse or doctor.          amLODipine 5 MG tablet Commonly known as: NORVASC Take 1 tablet (5 mg total) by mouth daily. What changed: how much to take   cholecalciferol 25 MCG (1000 UNIT) tablet Commonly known as: VITAMIN D3 Take 2,000 Units by mouth daily.   cyanocobalamin 1000 MCG tablet Commonly known as: VITAMIN B12 Take 1,000 mcg by mouth daily.   dapagliflozin propanediol 5 MG Tabs tablet Commonly known as: Farxiga Take 1 tablet (5 mg total) by mouth daily.   fluticasone 50 MCG/ACT nasal spray Commonly known as: FLONASE Place 2 sprays into both nostrils in the  morning and at bedtime.   lisinopril 40 MG tablet Commonly known as: ZESTRIL Take 1 tablet (40 mg total) by mouth daily.   metFORMIN 1000 MG tablet Commonly known as: GLUCOPHAGE Take 1 tablet (1,000 mg total) by mouth daily with breakfast.        All past medical history, surgical history, allergies, family history, immunizations andmedications were updated in the EMR today and reviewed under the history and medication portions of their EMR.     ROS Negative, with the exception of above mentioned in HPI   Objective:  BP 117/73   Pulse 89   Temp 97.7 F (36.5 C)   Wt 172 lb 12.8 oz (78.4 kg)   LMP 04/27/2005 (Approximate)   SpO2 98%   BMI 31.00 kg/m  Body mass index is 31 kg/m. Physical Exam Vitals and nursing note reviewed. Exam conducted with a chaperone present.  Constitutional:      General: She is not in acute distress.    Appearance: Normal appearance. She is normal weight. She is not ill-appearing or toxic-appearing.  HENT:     Head: Normocephalic and atraumatic.  Eyes:     General: No scleral icterus.       Right eye: No discharge.        Left eye: No discharge.     Extraocular Movements: Extraocular movements intact.     Conjunctiva/sclera: Conjunctivae normal.     Pupils: Pupils are equal, round, and reactive to light.  Chest:     Chest wall: Tenderness present. No mass, lacerations, deformity or swelling.  Breasts:    Breasts are symmetrical.     Right: Normal. No swelling, bleeding, inverted nipple, mass, nipple discharge, skin change or tenderness.     Left: Tenderness present. No swelling, bleeding, inverted nipple, mass, nipple discharge or skin change.       Comments: No erythema, dimpling, or skin changes noted. Mild TTP left breast 10 o'clock position - no discrete mass palapted. Increased density of breast laterally. Lymphadenopathy:     Upper Body:     Right upper body: No supraclavicular, axillary or pectoral adenopathy.     Left upper  body: No supraclavicular, axillary or pectoral adenopathy.  Skin:    Findings: No rash.  Neurological:     Mental Status: She is alert and oriented to person, place, and time. Mental status is at baseline.     Motor:  No weakness.     Coordination: Coordination normal.     Gait: Gait normal.  Psychiatric:        Mood and Affect: Mood normal.        Behavior: Behavior normal.        Thought Content: Thought content normal.        Judgment: Judgment normal.      No results found. No results found. No results found for this or any previous visit (from the past 24 hour(s)).  Assessment/Plan: Maria Watkins is a 67 y.o. female present for OV for  Breast tenderness in female/FHx: breast cancer- x2 Mat aunt Since October mammogram was normal patient reports she had her mammogram in October 2023 at her gynecology office and it was normal. She does have tenderness on exam today at the 10 o'clock position, and some mild increased density of her left lateral breast in comparison to right breast. Order diagnostic left breast mammogram and ultrasound to further evaluate.  Patient has family history of 2 maternal aunts that had passed away from breast cancer. - MM 3D DIAGNOSTIC MAMMOGRAM UNILATERAL LEFT BREAST; Future - US BREAST COMPLETE UNI LEFT INC AXILLA; Future   Reviewed expectations re: course of current medical issues. Discussed self-management of symptoms. Outlined signs and symptoms indicating need for more acute intervention. Patient verbalized understanding and all questions were answered. Patient received an After-Visit Summary.    No orders of the defined types were placed in this encounter.  No orders of the defined types were placed in this encounter.  Referral Orders  No referral(s) requested today     Note is dictated utilizing voice recognition software. Although note has been proof read prior to signing, occasional typographical errors still can be missed. If  any questions arise, please do not hesitate to call for verification.   electronically signed by:  Felix Pacini, DO  [facility] - OR

## 2022-11-17 NOTE — Patient Instructions (Signed)
Return if symptoms worsen or fail to improve.        Great to see you today.    

## 2022-11-20 ENCOUNTER — Ambulatory Visit: Payer: PPO

## 2022-11-20 ENCOUNTER — Ambulatory Visit
Admission: RE | Admit: 2022-11-20 | Discharge: 2022-11-20 | Disposition: A | Discharge status: Home / Self Care | Payer: PPO | Source: Ambulatory Visit | Attending: Family Medicine | Admitting: Family Medicine

## 2022-11-20 DIAGNOSIS — N644 Mastodynia: Secondary | ICD-10-CM

## 2022-11-25 ENCOUNTER — Encounter (INDEPENDENT_AMBULATORY_CARE_PROVIDER_SITE_OTHER): Payer: Self-pay

## 2022-12-07 ENCOUNTER — Ambulatory Visit: Payer: PPO

## 2022-12-09 ENCOUNTER — Ambulatory Visit (INDEPENDENT_AMBULATORY_CARE_PROVIDER_SITE_OTHER): Payer: PPO

## 2022-12-09 DIAGNOSIS — E538 Deficiency of other specified B group vitamins: Secondary | ICD-10-CM

## 2022-12-09 NOTE — Progress Notes (Signed)
Pt here for monthly B12 injection per Dr.Kuneff   B12 1000mcg given IM, and pt tolerated injection well.   Next B12 injection scheduled for 1 month.       

## 2022-12-10 ENCOUNTER — Encounter (INDEPENDENT_AMBULATORY_CARE_PROVIDER_SITE_OTHER): Payer: Self-pay

## 2022-12-21 ENCOUNTER — Ambulatory Visit: Payer: PPO | Admitting: Family Medicine

## 2022-12-23 ENCOUNTER — Ambulatory Visit: Payer: PPO | Admitting: Family Medicine

## 2022-12-25 ENCOUNTER — Ambulatory Visit: Payer: PPO | Admitting: Family Medicine

## 2023-01-06 ENCOUNTER — Ambulatory Visit (INDEPENDENT_AMBULATORY_CARE_PROVIDER_SITE_OTHER): Payer: PPO

## 2023-01-06 VITALS — Wt 172.0 lb

## 2023-01-06 DIAGNOSIS — Z0000 Encounter for general adult medical examination without abnormal findings: Secondary | ICD-10-CM | POA: Diagnosis not present

## 2023-01-06 NOTE — Patient Instructions (Signed)
Maria Watkins , Thank you for taking time to come for your Medicare Wellness Visit. I appreciate your ongoing commitment to your health goals. Please review the following plan we discussed and let me know if I can assist you in the future.   Referrals/Orders/Follow-Ups/Clinician Recommendations: get A1C down and get off diabetic medications  This is a list of the screening recommended for you and due dates:  Health Maintenance  Topic Date Due   Flu Shot  11/26/2022   Hemoglobin A1C  02/18/2023   Mammogram  02/21/2023   Complete foot exam   03/11/2023   DTaP/Tdap/Td vaccine (2 - Td or Tdap) 04/28/2023   Eye exam for diabetics  06/25/2023   Yearly kidney function blood test for diabetes  08/19/2023   Yearly kidney health urinalysis for diabetes  08/19/2023   DEXA scan (bone density measurement)  09/02/2023   Medicare Annual Wellness Visit  01/06/2024   Colon Cancer Screening  08/25/2024   Pneumonia Vaccine (3 of 3 - PPSV23 or PCV20) 02/22/2025   Hepatitis C Screening  Completed   HPV Vaccine  Aged Out   COVID-19 Vaccine  Discontinued   Zoster (Shingles) Vaccine  Discontinued    Advanced directives: (Declined) Advance directive discussed with you today. Even though you declined this today, please call our office should you change your mind, and we can give you the proper paperwork for you to fill out.  Next Medicare Annual Wellness Visit scheduled for next year: Yes

## 2023-01-06 NOTE — Progress Notes (Signed)
Subjective:   Maria Watkins is a 67 y.o. female who presents for Medicare Annual (Subsequent) preventive examination.  Visit Complete: Virtual  I connected with  Greyson L Karbowski on 01/06/23 by a audio enabled telemedicine application and verified that I am speaking with the correct person using two identifiers.  Patient Location: Home  Provider Location: Home Office  I discussed the limitations of evaluation and management by telemedicine. The patient expressed understanding and agreed to proceed.   Vital Signs: Unable to obtain new vitals due to this being a telehealth visit.   Review of Systems     Cardiac Risk Factors include: advanced age (>69men, >73 women);hypertension;dyslipidemia;diabetes mellitus;obesity (BMI >30kg/m2)     Objective:    Today's Vitals   01/06/23 1527  Weight: 172 lb (78 kg)   Body mass index is 30.86 kg/m.     01/06/2023    3:31 PM 01/20/2021    5:47 PM 01/19/2021   10:12 AM 01/13/2021   11:53 AM 01/07/2021   11:28 AM  Advanced Directives  Does Patient Have a Medical Advance Directive? No No No No No  Would patient like information on creating a medical advance directive? No - Patient declined No - Patient declined No - Patient declined No - Patient declined No - Patient declined    Current Medications (verified) Outpatient Encounter Medications as of 01/06/2023  Medication Sig   amLODipine (NORVASC) 5 MG tablet Take 1 tablet (5 mg total) by mouth daily. (Patient taking differently: Take 2.5 mg by mouth daily.)   cholecalciferol (VITAMIN D3) 25 MCG (1000 UT) tablet Take 2,000 Units by mouth daily.   fluticasone (FLONASE) 50 MCG/ACT nasal spray Place 2 sprays into both nostrils in the morning and at bedtime.   lisinopril (ZESTRIL) 40 MG tablet Take 1 tablet (40 mg total) by mouth daily.   metFORMIN (GLUCOPHAGE) 1000 MG tablet Take 1 tablet (1,000 mg total) by mouth daily with breakfast.   vitamin B-12 (CYANOCOBALAMIN) 1000 MCG tablet Take  1,000 mcg by mouth daily.   [DISCONTINUED] dapagliflozin propanediol (FARXIGA) 5 MG TABS tablet Take 1 tablet (5 mg total) by mouth daily. (Patient not taking: Reported on 09/01/2022)   Facility-Administered Encounter Medications as of 01/06/2023  Medication   cyanocobalamin (VITAMIN B12) injection 1,000 mcg    Allergies (verified) Terconazole and Fosamax [alendronate]   History: Past Medical History:  Diagnosis Date   Abnormal Pap smear of cervix 1980's   resolved post cryo with normal follow up   Depression    Elevated alkaline phosphatase level 05/15/2019   Estrogen deficiency 05/15/2019   Gallstones 08/04/2012   GERD (gastroesophageal reflux disease) 11/09/2019   Hypertension    Lymphadenopathy 08/02/2019   Type 2 diabetes mellitus without complication, without long-term current use of insulin (HCC) 01/18/2018   Past Surgical History:  Procedure Laterality Date   CERVIX LESION DESTRUCTION  1980s   CHOLECYSTECTOMY N/A 01/13/2021   Procedure: LAPAROSCOPIC CHOLECYSTECTOMY WITH INTRAOPERATIVE CHOLANGIOGRAM;  Surgeon: Griselda Miner, MD;  Location: [facility] OR;  Service: General;  Laterality: N/A;   COLPOSCOPY  1980's   Family History  Problem Relation Age of Onset   Diabetes Mother    Arthritis Mother    COPD Father    Asthma Father    Heart disease Father    Diabetes Sister    Asthma Brother    COPD Sister    Rheum arthritis Sister    Pulmonary fibrosis Sister        Passed away 05-03-23  2021   Breast cancer Maternal Aunt    Alcohol abuse Son    Social History   Socioeconomic History   Marital status: Married    Spouse name: Not on file   Number of children: Not on file   Years of education: Not on file   Highest education level: Not on file  Occupational History   Occupation: retired  Tobacco Use   Smoking status: Former    Current packs/day: 0.00    Types: Cigarettes    Quit date: 04/28/1991    Years since quitting: 31.7   Smokeless tobacco: Never  Vaping Use    Vaping status: Never Used  Substance and Sexual Activity   Alcohol use: No   Drug use: No   Sexual activity: Never    Birth control/protection: Post-menopausal  Other Topics Concern   Not on file  Social History Narrative   Not on file   Social Determinants of Health   Financial Resource Strain: Low Risk  (01/06/2023)   Overall Financial Resource Strain (CARDIA)    Difficulty of Paying Living Expenses: Not hard at all  Food Insecurity: No Food Insecurity (01/06/2023)   Hunger Vital Sign    Worried About Running Out of Food in the Last Year: Never true    Ran Out of Food in the Last Year: Never true  Transportation Needs: No Transportation Needs (01/06/2023)   PRAPARE - Administrator, Civil Service (Medical): No    Lack of Transportation (Non-Medical): No  Physical Activity: Insufficiently Active (01/06/2023)   Exercise Vital Sign    Days of Exercise per Week: 7 days    Minutes of Exercise per Session: 20 min  Stress: No Stress Concern Present (01/06/2023)   [employer] of Occupational Health - Occupational Stress Questionnaire    Feeling of Stress : Only a little  Social Connections: Moderately Integrated (01/06/2023)   Social Connection and Isolation Panel [NHANES]    Frequency of Communication with Friends and Family: More than three times a week    Frequency of Social Gatherings with Friends and Family: Once a week    Attends Religious Services: More than 4 times per year    Active Member of [employer] or Organizations: No    Attends Engineer, structural: Never    Marital Status: Married    Tobacco Counseling Counseling given: Not Answered   Clinical Intake:  Pre-visit preparation completed: Yes  Pain : No/denies pain     BMI - recorded: 30.86 Nutritional Status: BMI > 30  Obese Nutritional Risks: None Diabetes: Yes CBG done?: No Did pt. bring in CBG monitor from home?: No  How often do you need to have someone help you when you read  instructions, pamphlets, or other written materials from your doctor or pharmacy?: 1 - Never  Interpreter Needed?: No  Information entered by :: Lanier Ensign, LPN   Activities of Daily Living    01/06/2023    3:28 PM 01/23/2022    9:33 AM  In your present state of health, do you have any difficulty performing the following activities:  Hearing? 0 0  Vision? 0 0  Difficulty concentrating or making decisions? 0 0  Walking or climbing stairs? 0 0  Dressing or bathing? 0 0  Doing errands, shopping? 0 0  Preparing Food and eating ? N N  Using the Toilet? N N  In the past six months, have you accidently leaked urine? N N  Do you have problems with  loss of bowel control? N N  Managing your Medications? N N  Managing your Finances? N N  Housekeeping or managing your Housekeeping? N N    Patient Care Team: Natalia Leatherwood, DO as PCP - General (Family Medicine) Charna Elizabeth, MD as Consulting Physician (Gastroenterology) Marlow Baars, MD as Consulting Physician (Obstetrics) Jill Side, OD as Referring Physician  Indicate any recent Medical Services you may have received from other than [facility] providers in the past year (date may be approximate).     Assessment:   This is a routine wellness examination for [place].  Hearing/Vision screen Hearing Screening - Comments:: Pt denies any hearing issues  Vision Screening - Comments:: Pt follows up with Dr Cherlynn Polo for annual eye exams    Goals Addressed             This Visit's Progress    Patient Stated       Continue to get off diabetic meds and lower A1C       Depression Screen    01/06/2023    3:34 PM 09/09/2022   10:03 AM 08/19/2022    8:01 AM 06/09/2022   10:26 AM 05/11/2022    9:31 AM 01/23/2022   10:35 AM 01/23/2022    9:27 AM  PHQ 2/9 Scores  PHQ - 2 Score 0 0 0 1 2 0 0  PHQ- 9 Score    4 4      Fall Risk    01/06/2023    3:33 PM 09/09/2022   10:03 AM 08/19/2022    8:01 AM 06/09/2022   10:26 AM 05/11/2022     9:31 AM  Fall Risk   Falls in the past year? 0 0 0 0 0  Number falls in past yr: 0 0 0  0  Injury with Fall? 0 0 0 0 0  Risk for fall due to : Impaired vision    No Fall Risks  Follow up Falls prevention discussed Falls evaluation completed Falls evaluation completed  Falls evaluation completed    MEDICARE RISK AT HOME: Medicare Risk at Home Any stairs in or around the home?: Yes If so, are there any without handrails?: No Home free of loose throw rugs in walkways, pet beds, electrical cords, etc?: Yes Adequate lighting in your home to reduce risk of falls?: Yes Life alert?: No Use of a cane, walker or w/c?: No Grab bars in the bathroom?: Yes Shower chair or bench in shower?: Yes Elevated toilet seat or a handicapped toilet?: No  TIMED UP AND GO:  Was the test performed?  No    Cognitive Function:        01/06/2023    3:38 PM 01/23/2022    9:37 AM 01/20/2021    5:54 PM  6CIT Screen  What Year? 0 points 0 points 0 points  What month? 0 points 0 points 0 points  What time? 0 points 0 points 0 points  Count back from 20 0 points 0 points 0 points  Months in reverse 0 points 0 points 0 points  Repeat phrase 0 points 0 points 0 points  Total Score 0 points 0 points 0 points    Immunizations Immunization History  Administered Date(s) Administered   Fluad Quad(high Dose 65+) 01/16/2019, 03/19/2021, 02/04/2022   Influenza Inj Mdck Quad With Preservative 12/27/2018   Influenza, High Dose Seasonal PF 01/16/2019, 02/20/2020   Influenza,inj,Quad PF,6+ Mos 01/18/2018   Moderna Sars-Covid-2 Vaccination 06/08/2019, 07/07/2019   Pneumococcal Conjugate-13 02/23/2020  Pneumococcal Polysaccharide-23 04/14/2018   Tdap 04/27/2013    TDAP status: Up to date  Flu Vaccine status: Due, Education has been provided regarding the importance of this vaccine. Advised may receive this vaccine at local pharmacy or Health Dept. Aware to provide a copy of the vaccination record if obtained from  local pharmacy or Health Dept. Verbalized acceptance and understanding.  Pneumococcal vaccine status: Up to date  Covid-19 vaccine status: Declined, Education has been provided regarding the importance of this vaccine but patient still declined. Advised may receive this vaccine at local pharmacy or Health Dept.or vaccine clinic. Aware to provide a copy of the vaccination record if obtained from local pharmacy or Health Dept. Verbalized acceptance and understanding.  Qualifies for Shingles Vaccine? No    Screening Tests Health Maintenance  Topic Date Due   INFLUENZA VACCINE  07/26/2023 (Originally 11/26/2022)   HEMOGLOBIN A1C  02/18/2023   MAMMOGRAM  02/21/2023   FOOT EXAM  03/11/2023   DTaP/Tdap/Td (2 - Td or Tdap) 04/28/2023   OPHTHALMOLOGY EXAM  06/25/2023   Diabetic kidney evaluation - eGFR measurement  08/19/2023   Diabetic kidney evaluation - Urine ACR  08/19/2023   DEXA SCAN  09/02/2023   Medicare Annual Wellness (AWV)  01/06/2024   Colonoscopy  08/25/2024   Pneumonia Vaccine 5+ Years old (3 of 3 - PPSV23 or PCV20) 02/22/2025   Hepatitis C Screening  Completed   HPV VACCINES  Aged Out   COVID-19 Vaccine  Discontinued   Zoster Vaccines- Shingrix  Discontinued    Health Maintenance  There are no preventive care reminders to display for this patient.   Colorectal cancer screening: Type of screening: Colonoscopy. Completed 08/25/1608. Repeat every 10 years  Mammogram status: Completed 02/20/21. Repeat every year  Bone Density status: Completed 09/01/21. Results reflect: Bone density results: OSTEOPENIA. Repeat every 2 years.   Additional Screening:  Hepatitis C Screening:  Completed 01/07/16  Vision Screening: Recommended annual ophthalmology exams for early detection of glaucoma and other disorders of the eye. Is the patient up to date with their annual eye exam?  Yes  Who is the provider or what is the name of the office in which the patient attends annual eye exams? Dr  Cherlynn Polo If pt is not established with a provider, would they like to be referred to a provider to establish care? No .   Dental Screening: Recommended annual dental exams for proper oral hygiene  Diabetic Foot Exam: Diabetic Foot Exam: Completed 03/10/22  Community Resource Referral / Chronic Care Management: CRR required this visit?  No   CCM required this visit?  No     Plan:     I have personally reviewed and noted the following in the patient's chart:   Medical and social history Use of alcohol, tobacco or illicit drugs  Current medications and supplements including opioid prescriptions. Patient is not currently taking opioid prescriptions. Functional ability and status Nutritional status Physical activity Advanced directives List of other physicians Hospitalizations, surgeries, and ER visits in previous 12 months Vitals Screenings to include cognitive, depression, and falls Referrals and appointments  In addition, I have reviewed and discussed with patient certain preventive protocols, quality metrics, and best practice recommendations. A written personalized care plan for preventive services as well as general preventive health recommendations were provided to patient.     Marzella Schlein, LPN   08/03/8117   After Visit Summary: (MyChart) Due to this being a telephonic visit, the after visit summary with patients personalized  plan was offered to patient via MyChart   Nurse Notes: none

## 2023-01-11 ENCOUNTER — Ambulatory Visit (INDEPENDENT_AMBULATORY_CARE_PROVIDER_SITE_OTHER): Payer: PPO

## 2023-01-11 DIAGNOSIS — E538 Deficiency of other specified B group vitamins: Secondary | ICD-10-CM | POA: Diagnosis not present

## 2023-01-11 NOTE — Progress Notes (Signed)
Pt here for monthly B12 injection per Dr.Kuneff  B12 given IM, and pt tolerated injection well.  Next B12 injection scheduled for N/A, no current date

## 2023-01-14 ENCOUNTER — Ambulatory Visit (INDEPENDENT_AMBULATORY_CARE_PROVIDER_SITE_OTHER): Payer: PPO | Admitting: Family Medicine

## 2023-01-14 ENCOUNTER — Encounter: Payer: Self-pay | Admitting: Family Medicine

## 2023-01-14 VITALS — BP 120/71 | HR 52 | Temp 98.4°F | Wt 169.6 lb

## 2023-01-14 DIAGNOSIS — I517 Cardiomegaly: Secondary | ICD-10-CM | POA: Diagnosis not present

## 2023-01-14 DIAGNOSIS — I10 Essential (primary) hypertension: Secondary | ICD-10-CM

## 2023-01-14 DIAGNOSIS — E559 Vitamin D deficiency, unspecified: Secondary | ICD-10-CM

## 2023-01-14 DIAGNOSIS — E785 Hyperlipidemia, unspecified: Secondary | ICD-10-CM

## 2023-01-14 DIAGNOSIS — I5189 Other ill-defined heart diseases: Secondary | ICD-10-CM | POA: Diagnosis not present

## 2023-01-14 DIAGNOSIS — Z23 Encounter for immunization: Secondary | ICD-10-CM

## 2023-01-14 DIAGNOSIS — Z7984 Long term (current) use of oral hypoglycemic drugs: Secondary | ICD-10-CM

## 2023-01-14 DIAGNOSIS — M85852 Other specified disorders of bone density and structure, left thigh: Secondary | ICD-10-CM

## 2023-01-14 DIAGNOSIS — E1169 Type 2 diabetes mellitus with other specified complication: Secondary | ICD-10-CM | POA: Diagnosis not present

## 2023-01-14 LAB — POCT GLYCOSYLATED HEMOGLOBIN (HGB A1C)
HbA1c POC (<> result, manual entry): 6.7 % (ref 4.0–5.6)
HbA1c, POC (controlled diabetic range): 6.7 % (ref 0.0–7.0)
HbA1c, POC (prediabetic range): 6.7 % — AB (ref 5.7–6.4)
Hemoglobin A1C: 6.7 % — AB (ref 4.0–5.6)

## 2023-01-14 MED ORDER — AMLODIPINE BESYLATE 2.5 MG PO TABS: 2.5000 mg | ORAL_TABLET | Freq: Every day | ORAL | 1 refills | Status: DC

## 2023-01-14 MED ORDER — LISINOPRIL 40 MG PO TABS: 40.0000 mg | ORAL_TABLET | Freq: Every day | ORAL | 1 refills | Status: DC

## 2023-01-14 MED ORDER — METFORMIN HCL 1000 MG PO TABS: 1000.0000 mg | ORAL_TABLET | Freq: Every day | ORAL | 1 refills | Status: DC

## 2023-01-14 NOTE — Progress Notes (Signed)
Patient ID: Maria Watkins, female  DOB: Oct 12, 1955, 67 y.o.   MRN: [id number] Patient Care Team    Relationship Specialty Notifications Start End  Natalia Leatherwood, DO PCP - General Family Medicine  12/26/21   Charna Elizabeth, MD Consulting Physician Gastroenterology  01/18/18   Marlow Baars, MD Consulting Physician Obstetrics  01/18/18   Jill Side, OD Referring Physician   04/09/21     Chief Complaint  Patient presents with   Diabetes    Subjective: Maria Watkins is a 67 y.o.  Female  present for medication concerns Essential hypertension/morbid obesity/palpitations/HLD/valve regurg/enlarged aorta Pt reports compliance with lisinopril 40 mg daily and amlodipine 2.5  mg daily.  Patient denies chest pain, shortness of breath, dizziness or lower extremity edema.  Diet: Regular Exercise: Routine exercise RF: Hypertension, diabetes, obesity, former smoker, family history of heart disease   diabetes: Patient reports compliance  with metformin 1000 mg qd.  Reports Farxiga caused her dizziness.  She does not want to start this medication at this time. Patient denies dizziness, hyperglycemic or hypoglycemic events. Patient denies numbness, tingling in the extremities or nonhealing wounds of feet.       01/14/2023    8:30 AM 01/06/2023    3:34 PM 09/09/2022   10:03 AM 08/19/2022    8:01 AM 06/09/2022   10:26 AM  Depression screen PHQ 2/9  Decreased Interest 1 0 0 0 1  Down, Depressed, Hopeless 1 0 0 0 0  PHQ - 2 Score 2 0 0 0 1  Altered sleeping 1    1  Tired, decreased energy 1    1  Change in appetite 0    0  Feeling bad or failure about yourself  0    0  Trouble concentrating 0    1  Moving slowly or fidgety/restless 0    0  Suicidal thoughts 0    0  PHQ-9 Score 4    4  Difficult doing work/chores Not difficult at all          01/14/2023    8:31 AM 08/19/2022    8:02 AM 06/09/2022   10:26 AM  GAD 7 : Generalized Anxiety Score  Nervous, Anxious, on Edge 1 0 1   Control/stop worrying 1 1 1   Worry too much - different things 0 0 1  Trouble relaxing 0 0 0  Restless 0 0 0  Easily annoyed or irritable 0 0 1  Afraid - awful might happen 1 0 1  Total GAD 7 Score 3 1 5   Anxiety Difficulty Not difficult at all       Immunization History  Administered Date(s) Administered   Fluad Quad(high Dose 65+) 01/16/2019, 03/19/2021, 02/04/2022   Influenza Inj Mdck Quad With Preservative 12/27/2018   Influenza, High Dose Seasonal PF 01/16/2019, 02/20/2020   Influenza,inj,Quad PF,6+ Mos 01/18/2018   Moderna Sars-Covid-2 Vaccination 06/08/2019, 07/07/2019   Pneumococcal Conjugate-13 02/23/2020   Pneumococcal Polysaccharide-23 04/14/2018   Tdap 04/27/2013     Past Medical History:  Diagnosis Date   Abnormal Pap smear of cervix 1980's   resolved post cryo with normal follow up   Depression    Elevated alkaline phosphatase level 05/15/2019   Estrogen deficiency 05/15/2019   Gallstones 08/04/2012   GERD (gastroesophageal reflux disease) 11/09/2019   Hypertension    Lymphadenopathy 08/02/2019   Type 2 diabetes mellitus without complication, without long-term current use of insulin (HCC) 01/18/2018   Allergies  Allergen Reactions  Terconazole Nausea Only   Fosamax [Alendronate] Diarrhea and Nausea Only   Past Surgical History:  Procedure Laterality Date   CERVIX LESION DESTRUCTION  1980s   CHOLECYSTECTOMY N/A 01/13/2021   Procedure: LAPAROSCOPIC CHOLECYSTECTOMY WITH INTRAOPERATIVE CHOLANGIOGRAM;  Surgeon: Griselda Miner, MD;  Location: [facility] OR;  Service: General;  Laterality: N/A;   COLPOSCOPY  1980's   Family History  Problem Relation Age of Onset   Diabetes Mother    Arthritis Mother    COPD Father    Asthma Father    Heart disease Father    Diabetes Sister    Asthma Brother    COPD Sister    Rheum arthritis Sister    Pulmonary fibrosis Sister        Passed away 04/21/20  Breast cancer Maternal Aunt    Alcohol abuse Son    Social  History   Social History Narrative   Not on file    Allergies as of 01/14/2023       Reactions   Terconazole Nausea Only   Fosamax [alendronate] Diarrhea, Nausea Only        Medication List        Accurate as of January 14, 2023  8:48 AM. If you have any questions, ask your nurse or doctor.          amLODipine 2.5 MG tablet Commonly known as: NORVASC Take 1 tablet (2.5 mg total) by mouth daily. What changed:  medication strength how much to take Changed by: Felix Pacini   cholecalciferol 25 MCG (1000 UNIT) tablet Commonly known as: VITAMIN D3 Take 2,000 Units by mouth daily.   cyanocobalamin 1000 MCG tablet Commonly known as: VITAMIN B12 Take 1,000 mcg by mouth daily.   fluticasone 50 MCG/ACT nasal spray Commonly known as: FLONASE Place 2 sprays into both nostrils in the morning and at bedtime.   lisinopril 40 MG tablet Commonly known as: ZESTRIL Take 1 tablet (40 mg total) by mouth daily.   metFORMIN 1000 MG tablet Commonly known as: GLUCOPHAGE Take 1 tablet (1,000 mg total) by mouth daily with breakfast.        All past medical history, surgical history, allergies, family history, immunizations andmedications were updated in the EMR today and reviewed under the history and medication portions of their EMR.        ROS 14 pt review of systems performed and negative (unless mentioned in an HPI)  Objective: BP 120/71   Pulse (!) 52   Temp 98.4 F (36.9 C)   Wt 169 lb 9.6 oz (76.9 kg)   LMP 04/27/2005 (Approximate)   SpO2 99%   BMI 30.43 kg/m  Physical Exam Vitals and nursing note reviewed.  Constitutional:      General: She is not in acute distress.    Appearance: Normal appearance. She is normal weight. She is not ill-appearing, toxic-appearing or diaphoretic.  HENT:     Head: Normocephalic and atraumatic.  Eyes:     General: No scleral icterus.       Right eye: No discharge.        Left eye: No discharge.     Extraocular Movements:  Extraocular movements intact.     Conjunctiva/sclera: Conjunctivae normal.     Pupils: Pupils are equal, round, and reactive to light.  Cardiovascular:     Rate and Rhythm: Normal rate and regular rhythm.  Pulmonary:     Effort: Pulmonary effort is normal. No respiratory distress.     Breath sounds:  Normal breath sounds. No wheezing, rhonchi or rales.  Musculoskeletal:     Right lower leg: No edema.     Left lower leg: No edema.  Skin:    General: Skin is warm.     Findings: No rash.  Neurological:     Mental Status: She is alert and oriented to person, place, and time. Mental status is at baseline.     Motor: No weakness.     Coordination: Coordination normal.     Gait: Gait normal.  Psychiatric:        Mood and Affect: Mood normal.        Behavior: Behavior normal.        Thought Content: Thought content normal.        Judgment: Judgment normal.     Diabetic Foot Exam - Simple   Simple Foot Form Diabetic Foot exam was performed with the following findings: Yes 01/14/2023  8:47 AM  Visual Inspection No deformities, no ulcerations, no other skin breakdown bilaterally: Yes Sensation Testing Intact to touch and monofilament testing bilaterally: Yes Pulse Check Posterior Tibialis and Dorsalis pulse intact bilaterally: Yes Comments     Results for orders placed or performed in visit on 01/14/23 (from the past 48 hour(s))  POCT HgB A1C     Status: Abnormal   Collection Time: 01/14/23  8:31 AM  Result Value Ref Range   Hemoglobin A1C 6.7 (A) 4.0 - 5.6 %   HbA1c POC (<> result, manual entry) 6.7 4.0 - 5.6 %   HbA1c, POC (prediabetic range) 6.7 (A) 5.7 - 6.4 %   HbA1c, POC (controlled diabetic range) 6.7 0.0 - 7.0 %    Assessment/plan: TAYLOR JEREZ is a 67 y.o. female present close medications Essential/hypertension/obesity/palpitations/murmur/WCS/Diastolic dysfx- mild/regurg/enlarged aorta stable continue lisinopril to 40 mg daily  Continue amlodipine 2.5 mg daily   - her BP readings fluctuate frequently in response to stress/anxiety etc  - reassured her today, not to worry about BP unless < 100 systolic with symptoms or consistently > 140 systolic.    Type 2 diabetes mellitus w/ manifestations goal < 7 Continue metformin to 1000 mg qd -She was encouraged to focus on her diet and exercise  PNA series: Pneumonia series completed Flu shot: declined today (recommneded yearly) Foot exam: 9/19//2024 Eye exam: 05/2022 - Urine microalbumin-Creatinine with uACR collected today A1c: 6.7>> 6.7>> 6.3>> 6.5> 6.6> 7.3> 6.9>6.4>6.9> 7.3> 7.7> 6.7 collected today - great job!   Return in about 7 months (around 08/23/2023) for cpe (20 min), Routine chronic condition follow-up.   Orders Placed This Encounter  Procedures   POCT HgB A1C   Meds ordered this encounter  Medications   amLODipine (NORVASC) 2.5 MG tablet    Sig: Take 1 tablet (2.5 mg total) by mouth daily.    Dispense:  90 tablet    Refill:  1   lisinopril (ZESTRIL) 40 MG tablet    Sig: Take 1 tablet (40 mg total) by mouth daily.    Dispense:  90 tablet    Refill:  1   metFORMIN (GLUCOPHAGE) 1000 MG tablet    Sig: Take 1 tablet (1,000 mg total) by mouth daily with breakfast.    Dispense:  90 tablet    Refill:  1   Referral Orders  No referral(s) requested today     Electronically signed by: Felix Pacini, DO [facility] [place]

## 2023-01-14 NOTE — Patient Instructions (Addendum)
Return in about 7 months (around 08/23/2023) for cpe (20 min), Routine chronic condition follow-up.        Great to see you today.  I have refilled the medication(s) we provide.   If labs were collected or images ordered, we will inform you of  results once we have received them and reviewed. We will contact you either by echart message, or telephone call.  Please give ample time to the testing facility, and our office to run,  receive and review results. Please do not call inquiring of results, even if you can see them in your chart. We will contact you as soon as we are able. If it has been over 1 week since the test was completed, and you have not yet heard from [place], then please call us.    - echart message- for normal results that have been seen by the patient already.   - telephone call: abnormal results or if patient has not viewed results in their echart.  If a referral to a specialist was entered for you, please call us in 2 weeks if you have not heard from the specialist office to schedule.

## 2023-01-20 ENCOUNTER — Encounter: Payer: Self-pay | Admitting: Pharmacist

## 2023-01-20 ENCOUNTER — Ambulatory Visit: Payer: PPO

## 2023-01-20 NOTE — Progress Notes (Signed)
Pharmacy Quality Measure Review  This patient is appearing on a report for being at risk of failing the adherence measure for diabetes medications this calendar year.   Medication: Farxiga 5 mg Last fill date: 11/21/2022 for 90 day supply  Insurance report was not up to date. No action needed at this time.   Arbutus Leas, PharmD, BCPS [facility] [phone]

## 2023-02-01 ENCOUNTER — Ambulatory Visit: Payer: PPO | Attending: Family Medicine

## 2023-02-01 ENCOUNTER — Ambulatory Visit (INDEPENDENT_AMBULATORY_CARE_PROVIDER_SITE_OTHER): Payer: PPO | Admitting: Family Medicine

## 2023-02-01 ENCOUNTER — Encounter: Payer: Self-pay | Admitting: Family Medicine

## 2023-02-01 VITALS — BP 152/84 | HR 64 | Temp 97.6°F | Wt 169.0 lb

## 2023-02-01 DIAGNOSIS — H6122 Impacted cerumen, left ear: Secondary | ICD-10-CM | POA: Insufficient documentation

## 2023-02-01 DIAGNOSIS — R002 Palpitations: Secondary | ICD-10-CM | POA: Diagnosis not present

## 2023-02-01 DIAGNOSIS — H539 Unspecified visual disturbance: Secondary | ICD-10-CM

## 2023-02-01 DIAGNOSIS — R410 Disorientation, unspecified: Secondary | ICD-10-CM | POA: Insufficient documentation

## 2023-02-01 MED ORDER — DEBROX 6.5 % OT SOLN: OTIC | 0 refills | Status: DC

## 2023-02-01 NOTE — Progress Notes (Unsigned)
Maria Watkins , 06/05/1955, 67 y.o., female MRN: [id number] Patient Care Team    Relationship Specialty Notifications Start End  Natalia Leatherwood, DO PCP - General Family Medicine  12/26/21   Charna Elizabeth, MD Consulting Physician Gastroenterology  01/18/18   Marlow Baars, MD Consulting Physician Obstetrics  01/18/18   Jill Side, OD Referring Physician   04/09/21     Chief Complaint  Patient presents with   cognitive changes    C/O of changes in thought process for a few seconds; happening mor frequently with indigestion     Subjective: Maria Watkins is a 67 y.o. Pt presents for an OV with complaints of is in her thought process that has been happening intermittently, but more frequent.  Patient describes the events as "lots of wind in her throat ", then her vision and brain function becomes "scrambled."She states typically when she was having the symptoms intermittently would happen when she was active.  She recently had an event when she was driving and she just pulled over and waited for the feeling to pass.  She states people around her would not note that this was occurring.  She just sits or rests for a few seconds and symptoms all resolved.  She states it feels like air comes up her throat, otherwise there is no pain, chest pain, shortness of breath or dizziness.  She reports the last a few seconds and then self resolved.  They can occur up to 2 times a day now.  Sometimes when it happens she will feel tightness in her neck.  She does have a history of palpitations.  She also reports more sinus issues, especially left ear.      01/14/2023    8:30 AM 01/06/2023    3:34 PM 09/09/2022   10:03 AM 08/19/2022    8:01 AM 06/09/2022   10:26 AM  Depression screen PHQ 2/9  Decreased Interest 1 0 0 0 1  Down, Depressed, Hopeless 1 0 0 0 0  PHQ - 2 Score 2 0 0 0 1  Altered sleeping 1    1  Tired, decreased energy 1    1  Change in appetite 0    0  Feeling bad or failure about  yourself  0    0  Trouble concentrating 0    1  Moving slowly or fidgety/restless 0    0  Suicidal thoughts 0    0  PHQ-9 Score 4    4  Difficult doing work/chores Not difficult at all        Allergies  Allergen Reactions   Terconazole Nausea Only   Fosamax [Alendronate] Diarrhea and Nausea Only   Social History   Social History Narrative   Not on file   Past Medical History:  Diagnosis Date   Abnormal Pap smear of cervix 1980's   resolved post cryo with normal follow up   Depression    Elevated alkaline phosphatase level 05/15/2019   Estrogen deficiency 05/15/2019   Gallstones 08/04/2012   GERD (gastroesophageal reflux disease) 11/09/2019   Hypertension    Lymphadenopathy 08/02/2019   Type 2 diabetes mellitus without complication, without long-term current use of insulin (HCC) 01/18/2018   Past Surgical History:  Procedure Laterality Date   CERVIX LESION DESTRUCTION  1980s   CHOLECYSTECTOMY N/A 01/13/2021   Procedure: LAPAROSCOPIC CHOLECYSTECTOMY WITH INTRAOPERATIVE CHOLANGIOGRAM;  Surgeon: Griselda Miner, MD;  Location: [facility] OR;  Service: General;  Laterality:  N/A;   COLPOSCOPY  1980's   Family History  Problem Relation Age of Onset   Diabetes Mother    Arthritis Mother    COPD Father    Asthma Father    Heart disease Father    Diabetes Sister    Asthma Brother    COPD Sister    Rheum arthritis Sister    Pulmonary fibrosis Sister        Passed away 04/25/2020  Breast cancer Maternal Aunt    Alcohol abuse Son    Allergies as of 02/01/2023       Reactions   Terconazole Nausea Only   Fosamax [alendronate] Diarrhea, Nausea Only        Medication List        Accurate as of February 01, 2023 11:59 PM. If you have any questions, ask your nurse or doctor.          amLODipine 2.5 MG tablet Commonly known as: NORVASC Take 1 tablet (2.5 mg total) by mouth daily.   cholecalciferol 25 MCG (1000 UNIT) tablet Commonly known as: VITAMIN D3 Take 2,000  Units by mouth daily.   cyanocobalamin 1000 MCG tablet Commonly known as: VITAMIN B12 Take 1,000 mcg by mouth daily.   Debrox 6.5 % OTIC solution Generic drug: carbamide peroxide 3-5 drops left ear once a day for 5-7 days as needed. Started by: Felix Pacini   fluticasone 50 MCG/ACT nasal spray Commonly known as: FLONASE Place 2 sprays into both nostrils in the morning and at bedtime.   lisinopril 40 MG tablet Commonly known as: ZESTRIL Take 1 tablet (40 mg total) by mouth daily.   metFORMIN 1000 MG tablet Commonly known as: GLUCOPHAGE Take 1 tablet (1,000 mg total) by mouth daily with breakfast.        All past medical history, surgical history, allergies, family history, immunizations andmedications were updated in the EMR today and reviewed under the history and medication portions of their EMR.     ROS Negative, with the exception of above mentioned in HPI   Objective:  BP (!) 152/84   Pulse 64   Temp 97.6 F (36.4 C)   Wt 169 lb (76.7 kg)   LMP 04/27/2005 (Approximate)   SpO2 99%   BMI 30.32 kg/m  Body mass index is 30.32 kg/m. Physical Exam Vitals and nursing note reviewed.  Constitutional:      General: She is not in acute distress.    Appearance: Normal appearance. She is not ill-appearing, toxic-appearing or diaphoretic.  HENT:     Head: Normocephalic and atraumatic.     Right Ear: Tympanic membrane, ear canal and external ear normal.     Left Ear: There is impacted cerumen.  Eyes:     General: No scleral icterus.       Right eye: No discharge.        Left eye: No discharge.     Extraocular Movements: Extraocular movements intact.     Conjunctiva/sclera: Conjunctivae normal.     Pupils: Pupils are equal, round, and reactive to light.  Cardiovascular:     Rate and Rhythm: Normal rate and regular rhythm.     Heart sounds: No murmur heard. Pulmonary:     Effort: Pulmonary effort is normal. No respiratory distress.     Breath sounds: Normal breath  sounds. No wheezing, rhonchi or rales.  Musculoskeletal:     Right lower leg: No edema.     Left lower leg: No edema.  Skin:  General: Skin is warm.     Findings: No rash.  Neurological:     Mental Status: She is alert and oriented to person, place, and time. Mental status is at baseline.     Motor: No weakness.     Gait: Gait normal.  Psychiatric:        Mood and Affect: Mood normal.        Behavior: Behavior normal.        Thought Content: Thought content normal.        Judgment: Judgment normal.    No results found. No results found. No results found for this or any previous visit (from the past 24 hour(s)).  Assessment/Plan: Maria Watkins is a 67 y.o. female present for OV for  Palpitations/confusion The way she describes the sensation seems to be consistent with palpitations versus arrhythmia. Encouraged to refrain from all caffeine, currently she drinks about a half of a soda a few times a day. Urged her to try and note if there is a pattern developing, such as occurs after meals. We discussed ZIO monitoring and possible cardiology referral, and she would like to initiate this today. - LONG TERM MONITOR (3-14 DAYS); Future -Carotid duplex studies ordered Strongly encouraged her, if she has the symptoms and they sustain, or are associated with chest pain, dizziness or shortness of breath, she should be seen in the emergency room at the time of the event.  Impacted cerumen of left ear Debrox prescribed with instruction on use.  If symptoms worsen follow-up and we can perform ear lavage for her.  Reviewed expectations re: course of current medical issues. Discussed self-management of symptoms. Outlined signs and symptoms indicating need for more acute intervention. Patient verbalized understanding and all questions were answered. Patient received an After-Visit Summary.    Orders Placed This Encounter  Procedures   US Carotid Duplex Bilateral   LONG TERM MONITOR  (3-14 DAYS)   Meds ordered this encounter  Medications   carbamide peroxide (DEBROX) 6.5 % OTIC solution    Sig: 3-5 drops left ear once a day for 5-7 days as needed.    Dispense:  15 mL    Refill:  0   Referral Orders  No referral(s) requested today     Note is dictated utilizing voice recognition software. Although note has been proof read prior to signing, occasional typographical errors still can be missed. If any questions arise, please do not hesitate to call for verification.   electronically signed by:  Felix Pacini, DO  [facility] [place]

## 2023-02-01 NOTE — Progress Notes (Unsigned)
[facility], EP TO READ.

## 2023-02-02 ENCOUNTER — Telehealth: Payer: Self-pay | Admitting: Family Medicine

## 2023-02-02 NOTE — Telephone Encounter (Signed)
Please inform patient I also ordered carotid duplex studies for her.  This is an ultrasound of her carotid arteries in her neck.

## 2023-02-05 ENCOUNTER — Ambulatory Visit: Payer: PPO

## 2023-02-05 DIAGNOSIS — R002 Palpitations: Secondary | ICD-10-CM | POA: Diagnosis not present

## 2023-02-05 DIAGNOSIS — R410 Disorientation, unspecified: Secondary | ICD-10-CM | POA: Diagnosis not present

## 2023-02-08 ENCOUNTER — Ambulatory Visit
Admission: RE | Admit: 2023-02-08 | Discharge: 2023-02-08 | Disposition: A | Discharge status: Home / Self Care | Payer: PPO | Source: Ambulatory Visit | Attending: Family Medicine | Admitting: Family Medicine

## 2023-02-08 ENCOUNTER — Telehealth: Payer: Self-pay | Admitting: Family Medicine

## 2023-02-08 DIAGNOSIS — R002 Palpitations: Secondary | ICD-10-CM | POA: Diagnosis not present

## 2023-02-08 DIAGNOSIS — E042 Nontoxic multinodular goiter: Secondary | ICD-10-CM

## 2023-02-08 DIAGNOSIS — H539 Unspecified visual disturbance: Secondary | ICD-10-CM | POA: Diagnosis not present

## 2023-02-08 DIAGNOSIS — R410 Disorientation, unspecified: Secondary | ICD-10-CM

## 2023-02-08 DIAGNOSIS — E041 Nontoxic single thyroid nodule: Secondary | ICD-10-CM | POA: Diagnosis not present

## 2023-02-08 NOTE — Telephone Encounter (Signed)
Please inform patient her carotid duplex studies were reassuring and looked great.  No blockages of her neck. She did have incidental thyroid nodules noted on the exam and we need to now obtain an ultrasound of her thyroid.  Thyroid nodules are very common and most of the time not worrisome.  However we need to get a baseline ultrasound to obtain size and see if these meet criteria to monitor over time or biopsy.  I have placed the order for the thyroid ultrasound.  Once she gets a date to have the ultrasound, she will need to call us back and schedule an appointment for 2 weeks after ultrasound date so that we can review all ultrasounds together.

## 2023-02-10 ENCOUNTER — Ambulatory Visit: Payer: PPO

## 2023-02-10 DIAGNOSIS — E538 Deficiency of other specified B group vitamins: Secondary | ICD-10-CM | POA: Diagnosis not present

## 2023-02-10 NOTE — Progress Notes (Signed)
Pt here for monthly B12 injection per Dr.Kuneff  B12 given IM, and pt tolerated injection well.  Next B12 injection scheduled for 1 month, 03/15/23

## 2023-02-12 ENCOUNTER — Ambulatory Visit: Payer: PPO

## 2023-02-12 ENCOUNTER — Ambulatory Visit
Admission: RE | Admit: 2023-02-12 | Discharge: 2023-02-12 | Disposition: A | Discharge status: Home / Self Care | Payer: PPO | Source: Ambulatory Visit | Attending: Family Medicine | Admitting: Family Medicine

## 2023-02-12 DIAGNOSIS — E042 Nontoxic multinodular goiter: Secondary | ICD-10-CM | POA: Diagnosis not present

## 2023-02-15 ENCOUNTER — Ambulatory Visit (INDEPENDENT_AMBULATORY_CARE_PROVIDER_SITE_OTHER): Payer: PPO

## 2023-02-15 DIAGNOSIS — Z23 Encounter for immunization: Secondary | ICD-10-CM | POA: Diagnosis not present

## 2023-02-15 NOTE — Progress Notes (Signed)
Pt presents for flu vaccine, given IM left deltoid, tolerated well.

## 2023-02-22 ENCOUNTER — Telehealth: Payer: Self-pay | Admitting: Family Medicine

## 2023-02-22 DIAGNOSIS — E042 Nontoxic multinodular goiter: Secondary | ICD-10-CM | POA: Insufficient documentation

## 2023-02-22 NOTE — Telephone Encounter (Signed)
IMPRESSION: Enlarged- multinodular thyroid gland most consistent with multinodular goiter. None of the visualized thyroid nodules meets criteria to consider biopsy or imaging surveillance.  No follow-up imaging recommended.

## 2023-02-24 ENCOUNTER — Ambulatory Visit: Payer: PPO | Admitting: Family Medicine

## 2023-02-24 ENCOUNTER — Telehealth: Payer: Self-pay

## 2023-02-24 ENCOUNTER — Encounter: Payer: Self-pay | Admitting: Family Medicine

## 2023-02-24 VITALS — BP 158/100 | HR 76 | Temp 97.7°F | Wt 166.8 lb

## 2023-02-24 DIAGNOSIS — M5416 Radiculopathy, lumbar region: Secondary | ICD-10-CM

## 2023-02-24 MED ORDER — PREDNISONE 20 MG PO TABS: ORAL_TABLET | ORAL | 0 refills | Status: DC

## 2023-02-24 MED ORDER — TIZANIDINE HCL 4 MG PO TABS
2.0000 mg | ORAL_TABLET | Freq: Three times a day (TID) | ORAL | 0 refills | Status: DC | PRN
Start: 2023-02-24 — End: 2023-11-01

## 2023-02-24 NOTE — Progress Notes (Unsigned)
Maria Watkins , [birth year], 67 y.o., female MRN: [id number] Patient Care Team    Relationship Specialty Notifications Start End  Natalia Leatherwood, DO PCP - General Family Medicine  12/26/21   Charna Elizabeth, MD Consulting Physician Gastroenterology  01/18/18   Marlow Baars, MD Consulting Physician Obstetrics  01/18/18   Jill Side, OD Referring Physician   04/09/21     Chief Complaint  Patient presents with   Leg Pain    Started on Monday, moving pain from left lower back to top of foot     Subjective: Maria Watkins is a 67 y.o. Pt presents for an OV with complaints of left leg pain of 2 days duration.  Associated symptoms include radiation of pain to the top of her foot. Patient reports she was out gardening over the weekend and had lifted some heavier objects in the yard.  Her pain started on Monday.  She has not had pain like this in the past.  She states the pain is not in her back but is mostly radiating from her upper lateral hip area down to the top of her foot. She had taken a few ibuprofen help ease off the pain, but it did not help resolve the discomfort.      01/14/2023    8:30 AM 01/06/2023    3:34 PM 09/09/2022   10:03 AM 08/19/2022    8:01 AM 06/09/2022   10:26 AM  Depression screen PHQ 2/9  Decreased Interest 1 0 0 0 1  Down, Depressed, Hopeless 1 0 0 0 0  PHQ - 2 Score 2 0 0 0 1  Altered sleeping 1    1  Tired, decreased energy 1    1  Change in appetite 0    0  Feeling bad or failure about yourself  0    0  Trouble concentrating 0    1  Moving slowly or fidgety/restless 0    0  Suicidal thoughts 0    0  PHQ-9 Score 4    4  Difficult doing work/chores Not difficult at all        Allergies  Allergen Reactions   Terconazole Nausea Only   Fosamax [Alendronate] Diarrhea and Nausea Only   Social History   Social History Narrative   Not on file   Past Medical History:  Diagnosis Date   Abnormal Pap smear of cervix 1980's   resolved post cryo  with normal follow up   Depression    Elevated alkaline phosphatase level 05/15/2019   Estrogen deficiency 05/15/2019   Gallstones 08/04/2012   GERD (gastroesophageal reflux disease) 11/09/2019   Hypertension    Lymphadenopathy 08/02/2019   Type 2 diabetes mellitus without complication, without long-term current use of insulin (HCC) 01/18/2018   Past Surgical History:  Procedure Laterality Date   CERVIX LESION DESTRUCTION  1980s   CHOLECYSTECTOMY N/A 01/13/2021   Procedure: LAPAROSCOPIC CHOLECYSTECTOMY WITH INTRAOPERATIVE CHOLANGIOGRAM;  Surgeon: Griselda Miner, MD;  Location: [facility] OR;  Service: General;  Laterality: N/A;   COLPOSCOPY  1980's   Family History  Problem Relation Age of Onset   Diabetes Mother    Arthritis Mother    COPD Father    Asthma Father    Heart disease Father    Diabetes Sister    Asthma Brother    COPD Sister    Rheum arthritis Sister    Pulmonary fibrosis Sister  Passed away December 2021   Breast cancer Maternal Aunt    Alcohol abuse Son    Allergies as of 02/24/2023       Reactions   Terconazole Nausea Only   Fosamax [alendronate] Diarrhea, Nausea Only        Medication List        Accurate as of February 24, 2023 11:59 PM. If you have any questions, ask your nurse or doctor.          amLODipine 2.5 MG tablet Commonly known as: NORVASC Take 1 tablet (2.5 mg total) by mouth daily.   cholecalciferol 25 MCG (1000 UNIT) tablet Commonly known as: VITAMIN D3 Take 2,000 Units by mouth daily.   cyanocobalamin 1000 MCG tablet Commonly known as: VITAMIN B12 Take 1,000 mcg by mouth daily.   Debrox 6.5 % OTIC solution Generic drug: carbamide peroxide 3-5 drops left ear once a day for 5-7 days as needed.   fluticasone 50 MCG/ACT nasal spray Commonly known as: FLONASE Place 2 sprays into both nostrils in the morning and at bedtime.   lisinopril 40 MG tablet Commonly known as: ZESTRIL Take 1 tablet (40 mg total) by mouth daily.    metFORMIN 1000 MG tablet Commonly known as: GLUCOPHAGE Take 1 tablet (1,000 mg total) by mouth daily with breakfast.   predniSONE 20 MG tablet Commonly known as: DELTASONE 60 mg x3d, 40 mg x3d, 20 mg x2d, 10 mg x2d Started by: Felix Pacini   tiZANidine 4 MG tablet Commonly known as: Zanaflex Take 0.5-1 tablets (2-4 mg total) by mouth every 8 (eight) hours as needed for muscle spasms. Started by: Felix Pacini        All past medical history, surgical history, allergies, family history, immunizations andmedications were updated in the EMR today and reviewed under the history and medication portions of their EMR.     ROS Negative, with the exception of above mentioned in HPI   Objective:  BP (!) 158/100   Pulse 76   Temp 97.7 F (36.5 C)   Wt 166 lb 12.8 oz (75.7 kg)   LMP 04/27/2005 (Approximate)   SpO2 99%   BMI 29.93 kg/m  Body mass index is 29.93 kg/m.  Physical Exam Vitals and nursing note reviewed.  Constitutional:      General: She is not in acute distress.    Appearance: Normal appearance. She is normal weight. She is not ill-appearing or toxic-appearing.  HENT:     Head: Normocephalic and atraumatic.  Eyes:     General: No scleral icterus.       Right eye: No discharge.        Left eye: No discharge.     Extraocular Movements: Extraocular movements intact.     Conjunctiva/sclera: Conjunctivae normal.     Pupils: Pupils are equal, round, and reactive to light.  Musculoskeletal:        General: No swelling.     Lumbar back: No swelling, spasms, tenderness or bony tenderness. Decreased range of motion. Negative right straight leg raise test and negative left straight leg raise test.       Back:     Comments: Msk: 5/5 muscle strength bilateral lower extremities.  Neurovascularly intact distally  Skin:    Findings: No rash.  Neurological:     Mental Status: She is alert and oriented to person, place, and time. Mental status is at baseline.     Motor: No  weakness.     Coordination: Coordination normal.  Gait: Gait normal.  Psychiatric:        Mood and Affect: Mood normal.        Behavior: Behavior normal.        Thought Content: Thought content normal.        Judgment: Judgment normal.      No results found. No results found. No results found for this or any previous visit (from the past 24 hour(s)).  Assessment/Plan: Maria Watkins is a 67 y.o. female present for OV for  Lumbar radiculopathy, acute Rest, no lifting. Heating pad for comfort. Start Zanaflex 2-4 mg twice daily as needed Start prednisone taper F/u 2 weeks, sooner if needed  Reviewed expectations re: course of current medical issues. Discussed self-management of symptoms. Outlined signs and symptoms indicating need for more acute intervention. Patient verbalized understanding and all questions were answered. Patient received an After-Visit Summary.    No orders of the defined types were placed in this encounter.  Meds ordered this encounter  Medications   predniSONE (DELTASONE) 20 MG tablet    Sig: 60 mg x3d, 40 mg x3d, 20 mg x2d, 10 mg x2d    Dispense:  18 tablet    Refill:  0   tiZANidine (ZANAFLEX) 4 MG tablet    Sig: Take 0.5-1 tablets (2-4 mg total) by mouth every 8 (eight) hours as needed for muscle spasms.    Dispense:  30 tablet    Refill:  0   Referral Orders  No referral(s) requested today     Note is dictated utilizing voice recognition software. Although note has been proof read prior to signing, occasional typographical errors still can be missed. If any questions arise, please do not hesitate to call for verification.   electronically signed by:  Felix Pacini, DO  [facility] [place]

## 2023-02-24 NOTE — Telephone Encounter (Signed)
Please schedule patient for 1 week follow up leg pain and results

## 2023-02-24 NOTE — Patient Instructions (Addendum)
Return in about 1 week (around 03/03/2023) for leg pain and results.        Great to see you today.  I have refilled the medication(s) we provide.   If labs were collected or images ordered, we will inform you of  results once we have received them and reviewed. We will contact you either by echart message, or telephone call.  Please give ample time to the testing facility, and our office to run,  receive and review results. Please do not call inquiring of results, even if you can see them in your chart. We will contact you as soon as we are able. If it has been over 1 week since the test was completed, and you have not yet heard from [place], then please call us.    - echart message- for normal results that have been seen by the patient already.   - telephone call: abnormal results or if patient has not viewed results in their echart.  If a referral to a specialist was entered for you, please call us in 2 weeks if you have not heard from the specialist office to schedule.

## 2023-02-25 NOTE — Telephone Encounter (Signed)
Pt is scheduled for 1-18. 

## 2023-02-26 ENCOUNTER — Ambulatory Visit: Payer: PPO | Admitting: Family Medicine

## 2023-03-01 ENCOUNTER — Telehealth: Payer: Self-pay

## 2023-03-01 NOTE — Telephone Encounter (Signed)
Seen by Dr. Claiborne Billings last week, was given Prednisone.  She is still in pain.  She has never taken Prednisone before.  Does she need to give medication more time?  Please call patient [phone]

## 2023-03-02 DIAGNOSIS — R410 Disorientation, unspecified: Secondary | ICD-10-CM | POA: Diagnosis not present

## 2023-03-02 DIAGNOSIS — R002 Palpitations: Secondary | ICD-10-CM | POA: Diagnosis not present

## 2023-03-02 NOTE — Telephone Encounter (Signed)
I would recommend she finish the prednisone taper and use the muscle relaxant prescribed. Make sure to rest and avoid picking up anything heavy or bending, anything that reproduces her symptoms. Can take 2-4 weeks for symptoms to improve and resolve. If symptoms appear to be worsening, or do not resolve/improve within the 2-4-week.,  Then I would recommend her come back in for reevaluation and we would likely need to move forward with imaging at that time.

## 2023-03-03 NOTE — Telephone Encounter (Signed)
See other encounter.

## 2023-03-03 NOTE — Telephone Encounter (Signed)
Ok to use topical creams

## 2023-03-03 NOTE — Telephone Encounter (Signed)
Pt wants to know if topical cream is okay to use as well for body aches

## 2023-03-04 ENCOUNTER — Other Ambulatory Visit: Payer: Self-pay | Admitting: Cardiology

## 2023-03-04 DIAGNOSIS — R002 Palpitations: Secondary | ICD-10-CM

## 2023-03-05 ENCOUNTER — Encounter: Payer: Self-pay | Admitting: Family Medicine

## 2023-03-05 ENCOUNTER — Ambulatory Visit: Payer: PPO | Admitting: Family Medicine

## 2023-03-05 DIAGNOSIS — I4729 Other ventricular tachycardia: Secondary | ICD-10-CM | POA: Insufficient documentation

## 2023-03-08 ENCOUNTER — Ambulatory Visit
Admission: RE | Admit: 2023-03-08 | Discharge: 2023-03-08 | Disposition: A | Discharge status: Home / Self Care | Payer: PPO | Source: Ambulatory Visit | Attending: Family Medicine | Admitting: Family Medicine

## 2023-03-08 ENCOUNTER — Ambulatory Visit: Payer: PPO | Admitting: Family Medicine

## 2023-03-08 ENCOUNTER — Encounter: Payer: Self-pay | Admitting: Family Medicine

## 2023-03-08 VITALS — BP 138/80 | HR 84 | Temp 97.4°F | Wt 163.6 lb

## 2023-03-08 DIAGNOSIS — M79652 Pain in left thigh: Secondary | ICD-10-CM | POA: Diagnosis not present

## 2023-03-08 DIAGNOSIS — M25562 Pain in left knee: Secondary | ICD-10-CM | POA: Diagnosis not present

## 2023-03-08 DIAGNOSIS — M79605 Pain in left leg: Secondary | ICD-10-CM

## 2023-03-08 MED ORDER — NAPROXEN 500 MG PO TABS: 500.0000 mg | ORAL_TABLET | Freq: Two times a day (BID) | ORAL | 0 refills | Status: DC

## 2023-03-08 NOTE — Patient Instructions (Signed)

## 2023-03-08 NOTE — Progress Notes (Signed)
Maria Watkins , 1956-04-02, 67 y.o., female MRN: [id number] Patient Care Team    Relationship Specialty Notifications Start End  Natalia Leatherwood, DO PCP - General Family Medicine  12/26/21   Charna Elizabeth, MD Consulting Physician Gastroenterology  01/18/18   Marlow Baars, MD Consulting Physician Obstetrics  01/18/18   Jill Side, OD Referring Physician   04/09/21     Chief Complaint  Patient presents with   Leg Pain     Subjective: Maria Watkins is a 67 y.o. Pt presents for an OV to discuss her leg pain. She reports her lower back and upper hip pain has resolved. Pain now is located mostly distal portion of femur and left knee. She states getting up after sitting for awhile makes the pain unbearable. She again still denies any injury she can think of.  She had taken the prednisone, used heating pain and muscle relaxer. Pain no longer extends below her knee either.    Prior note: complaints of left leg pain of 2 days duration.  Associated symptoms include radiation of pain to the top of her foot. Patient reports she was out gardening over the weekend and had lifted some heavier objects in the yard.  Her pain started on Monday.  She has not had pain like this in the past.  She states the pain is not in her back but is mostly radiating from her upper lateral hip area down to the top of her foot. She had taken a few ibuprofen help ease off the pain, but it did not help resolve the discomfort.      01/14/2023    8:30 AM 01/06/2023    3:34 PM 09/09/2022   10:03 AM 08/19/2022    8:01 AM 06/09/2022   10:26 AM  Depression screen PHQ 2/9  Decreased Interest 1 0 0 0 1  Down, Depressed, Hopeless 1 0 0 0 0  PHQ - 2 Score 2 0 0 0 1  Altered sleeping 1    1  Tired, decreased energy 1    1  Change in appetite 0    0  Feeling bad or failure about yourself  0    0  Trouble concentrating 0    1  Moving slowly or fidgety/restless 0    0  Suicidal thoughts 0    0  PHQ-9 Score 4    4   Difficult doing work/chores Not difficult at all        Allergies  Allergen Reactions   Terconazole Nausea Only   Fosamax [Alendronate] Diarrhea and Nausea Only   Social History   Social History Narrative   Not on file   Past Medical History:  Diagnosis Date   Abnormal Pap smear of cervix 1980's   resolved post cryo with normal follow up   Depression    Elevated alkaline phosphatase level 05/15/2019   Estrogen deficiency 05/15/2019   Gallstones 08/04/2012   GERD (gastroesophageal reflux disease) 11/09/2019   Hypertension    Lymphadenopathy 08/02/2019   Type 2 diabetes mellitus without complication, without long-term current use of insulin (HCC) 01/18/2018   Past Surgical History:  Procedure Laterality Date   CERVIX LESION DESTRUCTION  1980s   CHOLECYSTECTOMY N/A 01/13/2021   Procedure: LAPAROSCOPIC CHOLECYSTECTOMY WITH INTRAOPERATIVE CHOLANGIOGRAM;  Surgeon: Griselda Miner, MD;  Location: [facility] OR;  Service: General;  Laterality: N/A;   COLPOSCOPY  1980's   Family History  Problem Relation Age of Onset  Diabetes Mother    Arthritis Mother    COPD Father    Asthma Father    Heart disease Father    Diabetes Sister    Asthma Brother    COPD Sister    Rheum arthritis Sister    Pulmonary fibrosis Sister        Passed away 27-Apr-2020  Breast cancer Maternal Aunt    Alcohol abuse Son    Allergies as of 03/08/2023       Reactions   Terconazole Nausea Only   Fosamax [alendronate] Diarrhea, Nausea Only        Medication List        Accurate as of March 08, 2023 11:53 AM. If you have any questions, ask your nurse or doctor.          amLODipine 2.5 MG tablet Commonly known as: NORVASC Take 1 tablet (2.5 mg total) by mouth daily.   cholecalciferol 25 MCG (1000 UNIT) tablet Commonly known as: VITAMIN D3 Take 2,000 Units by mouth daily.   cyanocobalamin 1000 MCG tablet Commonly known as: VITAMIN B12 Take 1,000 mcg by mouth daily.   Debrox 6.5 %  OTIC solution Generic drug: carbamide peroxide 3-5 drops left ear once a day for 5-7 days as needed.   fluticasone 50 MCG/ACT nasal spray Commonly known as: FLONASE Place 2 sprays into both nostrils in the morning and at bedtime.   lisinopril 40 MG tablet Commonly known as: ZESTRIL Take 1 tablet (40 mg total) by mouth daily.   metFORMIN 1000 MG tablet Commonly known as: GLUCOPHAGE Take 1 tablet (1,000 mg total) by mouth daily with breakfast.   naproxen 500 MG tablet Commonly known as: Naprosyn Take 1 tablet (500 mg total) by mouth 2 (two) times daily with a meal. Started by: Felix Pacini   predniSONE 20 MG tablet Commonly known as: DELTASONE 60 mg x3d, 40 mg x3d, 20 mg x2d, 10 mg x2d   tiZANidine 4 MG tablet Commonly known as: Zanaflex Take 0.5-1 tablets (2-4 mg total) by mouth every 8 (eight) hours as needed for muscle spasms.        All past medical history, surgical history, allergies, family history, immunizations andmedications were updated in the EMR today and reviewed under the history and medication portions of their EMR.     ROS Negative, with the exception of above mentioned in HPI   Objective:  BP 138/80   Pulse 84   Temp (!) 97.4 F (36.3 C)   Wt 163 lb 9.6 oz (74.2 kg)   LMP 04/27/2005 (Approximate)   SpO2 97%   BMI 29.35 kg/m  Body mass index is 29.35 kg/m.  Physical Exam Vitals and nursing note reviewed.  Constitutional:      General: She is not in acute distress.    Appearance: Normal appearance. She is normal weight. She is not ill-appearing or toxic-appearing.  HENT:     Head: Normocephalic and atraumatic.  Eyes:     General: No scleral icterus.       Right eye: No discharge.        Left eye: No discharge.     Extraocular Movements: Extraocular movements intact.     Conjunctiva/sclera: Conjunctivae normal.     Pupils: Pupils are equal, round, and reactive to light.  Musculoskeletal:     Right upper leg: Normal.     Left upper leg:  Tenderness and bony tenderness present.     Right knee: Normal.     Left  knee: Swelling (very mild swelling ~2 in above knee.), effusion and bony tenderness present. No deformity, erythema or ecchymosis. Decreased range of motion. Tenderness present over the medial joint line, lateral joint line and patellar tendon.     Instability Tests: Anterior drawer test negative. Posterior drawer test negative.       Legs:     Comments: Pain with FABRE> lateral thigh area.   Skin:    Findings: No rash.  Neurological:     Mental Status: She is alert and oriented to person, place, and time. Mental status is at baseline.     Motor: No weakness.     Coordination: Coordination normal.     Gait: Gait normal.  Psychiatric:        Mood and Affect: Mood normal.        Behavior: Behavior normal.        Thought Content: Thought content normal.        Judgment: Judgment normal.     No results found. No results found. No results found for this or any previous visit (from the past 24 hour(s)).  Assessment/Plan: Maria Watkins is a 67 y.o. female present for OV for  Left leg pain/Acute pain of left knee - DG Knee Complete 4 Views Left; Future - DG FEMUR MIN 2 VIEWS LEFT; Future Lumbar pain and hip pain has resolved.  Pain now resides distal portion of left femur and left knee. She has bony TTP over distal anterior femur and proximal lateral femur on exam. Pain of femur with weight bearing.  Jointline knee tenderness medial and lateral.  Rest, no lifting or squatting. Heating pad for comfort. Continue Zanaflex 2-4 mg twice daily as needed Start naproxen BID with food.  Xray- femur and knee (left) If xray results with knee/bone etiology will refer to ortho, otherwise elect to refer to [facility] to work up [employer] strain.  Reviewed expectations re: course of current medical issues. Discussed self-management of symptoms. Outlined signs and symptoms indicating need for more acute intervention. Patient verbalized  understanding and all questions were answered. Patient received an After-Visit Summary.    Orders Placed This Encounter  Procedures   DG Knee Complete 4 Views Left   DG FEMUR MIN 2 VIEWS LEFT   Meds ordered this encounter  Medications   naproxen (NAPROSYN) 500 MG tablet    Sig: Take 1 tablet (500 mg total) by mouth 2 (two) times daily with a meal.    Dispense:  60 tablet    Refill:  0   Referral Orders  No referral(s) requested today     Note is dictated utilizing voice recognition software. Although note has been proof read prior to signing, occasional typographical errors still can be missed. If any questions arise, please do not hesitate to call for verification.   electronically signed by:  Felix Pacini, DO  [facility] [place]

## 2023-03-09 ENCOUNTER — Telehealth: Payer: Self-pay | Admitting: Family Medicine

## 2023-03-09 ENCOUNTER — Ambulatory Visit: Payer: PPO | Admitting: Family Medicine

## 2023-03-09 ENCOUNTER — Encounter: Payer: Self-pay | Admitting: Family Medicine

## 2023-03-09 NOTE — Telephone Encounter (Signed)
No further action needed at this time.

## 2023-03-09 NOTE — Telephone Encounter (Signed)
Maria Watkins wanted to relay the message that she has made an appointment with Dr. Turner Watkins at [facility] for Thursday 11/14. She is no longer in need of the referral since she is already scheduled now. She also messaged Dr. Claiborne Watkins and says if any additional information is needed please message Maria Watkins via Mychart.

## 2023-03-09 NOTE — Telephone Encounter (Signed)
See my chart encounter.

## 2023-03-09 NOTE — Telephone Encounter (Signed)
Maria Watkins E52 minutes ago (10:42 AM)   IM Silvie wanted to relay the message that she has made an appointment with Dr. Turner Daniels at [facility] for Thursday 11/14. She is no longer in need of the referral since she is already scheduled now. She also messaged Dr. Claiborne Billings and says if any additional information is needed please message Anfal via Mychart.

## 2023-03-10 ENCOUNTER — Telehealth: Payer: Self-pay

## 2023-03-10 NOTE — Telephone Encounter (Signed)
Patient wants to know if she can take a medication that is prescribed to her husband, Maria Watkins for pain. Tramadol 50mg   Please advise

## 2023-03-10 NOTE — Telephone Encounter (Signed)
Approval to take her husband's pain medicine can not legally be approved, and would go against the rules of his controlled substance prescription.  I would recommend she discuss her pain with the orthopedic tomorrow.

## 2023-03-11 DIAGNOSIS — M25562 Pain in left knee: Secondary | ICD-10-CM | POA: Diagnosis not present

## 2023-03-15 ENCOUNTER — Ambulatory Visit: Payer: PPO

## 2023-03-17 ENCOUNTER — Ambulatory Visit: Payer: PPO

## 2023-03-23 DIAGNOSIS — M5442 Lumbago with sciatica, left side: Secondary | ICD-10-CM | POA: Diagnosis not present

## 2023-03-24 ENCOUNTER — Ambulatory Visit: Payer: PPO

## 2023-03-31 ENCOUNTER — Ambulatory Visit: Payer: PPO

## 2023-03-31 ENCOUNTER — Telehealth: Payer: Self-pay | Admitting: Family Medicine

## 2023-03-31 NOTE — Telephone Encounter (Signed)
To maintain her B12 level she should continue the injections, as it has been prescribed for her. Her pinched nerve pain and treatment plan can continue as they are planning, Regardless of her receiving B12 injections every 4 weeks.  Meaning her receiving B12 injections every 4 weeks would not negatively affect her morning treatment for her pinched nerve or vice versa.  It is okay to do both

## 2023-03-31 NOTE — Telephone Encounter (Signed)
Patient called to inform us that she will not schedule a B12 injection until after she receives the MRI on her back. She went to see Dr. Alen Blew and was told she has a pinched nerve in her back and next steps are to get a MRI, however she did want to inform Dr. Claiborne Billings she will continue to take her B12 tablets during this waiting period.

## 2023-04-02 ENCOUNTER — Encounter: Payer: Self-pay | Admitting: Pharmacist

## 2023-04-02 ENCOUNTER — Other Ambulatory Visit: Payer: Self-pay | Admitting: Orthopedic Surgery

## 2023-04-02 DIAGNOSIS — M545 Low back pain, unspecified: Secondary | ICD-10-CM

## 2023-04-02 NOTE — Progress Notes (Signed)
Pharmacy Quality Measure Review  This patient is appearing on a report for being at risk of failing the adherence measure for hypertension (ACEi/ARB) medications this calendar year.   Medication: lisinopril 40 mg Last fill date: 03/27/23 for 90 day supply  Insurance report was not up to date. No action needed at this time.   Arbutus Leas, PharmD, BCPS, CPP Clinical Pharmacist Practitioner [facility] [phone]

## 2023-04-04 ENCOUNTER — Other Ambulatory Visit: Payer: Self-pay | Admitting: Family Medicine

## 2023-04-09 ENCOUNTER — Telehealth: Payer: Self-pay | Admitting: Family Medicine

## 2023-04-09 NOTE — Telephone Encounter (Signed)
Spoke with patient regarding results/recommendations.  

## 2023-04-09 NOTE — Telephone Encounter (Signed)
Patient called to inquire if she should continue to take naproxen (NAPROSYN) 500 MG tablet. Please advise patient if she should continue to take the medication. Please give the patient a call to discuss.

## 2023-04-11 ENCOUNTER — Other Ambulatory Visit: Payer: Self-pay | Admitting: Family Medicine

## 2023-04-12 ENCOUNTER — Inpatient Hospital Stay
Admission: RE | Admit: 2023-04-12 | Discharge: 2023-04-12 | Discharge status: Home / Self Care | Payer: PPO | Source: Ambulatory Visit | Attending: Orthopedic Surgery | Admitting: Orthopedic Surgery

## 2023-04-12 DIAGNOSIS — M549 Dorsalgia, unspecified: Secondary | ICD-10-CM | POA: Diagnosis not present

## 2023-04-12 DIAGNOSIS — M545 Low back pain, unspecified: Secondary | ICD-10-CM

## 2023-04-13 ENCOUNTER — Other Ambulatory Visit: Payer: Self-pay | Admitting: Family Medicine

## 2023-04-13 DIAGNOSIS — M5442 Lumbago with sciatica, left side: Secondary | ICD-10-CM | POA: Diagnosis not present

## 2023-04-16 ENCOUNTER — Other Ambulatory Visit: Payer: PPO

## 2023-04-30 DIAGNOSIS — M5416 Radiculopathy, lumbar region: Secondary | ICD-10-CM | POA: Diagnosis not present

## 2023-05-06 ENCOUNTER — Ambulatory Visit: Payer: PPO | Admitting: Family Medicine

## 2023-05-10 ENCOUNTER — Encounter: Payer: Self-pay | Admitting: Family Medicine

## 2023-05-10 ENCOUNTER — Ambulatory Visit (INDEPENDENT_AMBULATORY_CARE_PROVIDER_SITE_OTHER): Payer: PPO | Admitting: Family Medicine

## 2023-05-10 VITALS — BP 142/88 | HR 78 | Temp 97.8°F | Wt 172.0 lb

## 2023-05-10 DIAGNOSIS — E538 Deficiency of other specified B group vitamins: Secondary | ICD-10-CM | POA: Diagnosis not present

## 2023-05-10 DIAGNOSIS — M5416 Radiculopathy, lumbar region: Secondary | ICD-10-CM | POA: Diagnosis not present

## 2023-05-10 MED ORDER — DICLOFENAC SODIUM ER 100 MG PO TB24: 100.0000 mg | ORAL_TABLET | Freq: Every day | ORAL | 1 refills | Status: DC

## 2023-05-10 NOTE — Progress Notes (Signed)
 Maria Watkins , 12-01-1955, 68 y.o., female MRN: [id number] Patient Care Team    Relationship Specialty Notifications Start End  Catherine Charlies LABOR, DO PCP - General Family Medicine  12/26/21   Kristie Lamprey, MD Consulting Physician Gastroenterology  01/18/18   Gretta Gums, MD Consulting Physician Obstetrics  01/18/18   Elspeth Lauraine DEL, OD Referring Physician   04/09/21     Chief Complaint  Patient presents with   Pain Management    Wants to update PCP from ortho and discuss pain management     Subjective: Maria Watkins is a 68 y.o. Pt presents for an OV to discuss her pain.  Patient reports she has been seen by orthopedics and they are scheduled to have epidural injection.  She has been taking naproxen  when needed.  She has had x-rays of her knee and MRI of her back since last being seen by this provider and has been diagnosed with lumbar radiculopathy secondary to degenerative disc disease and arthritis.  She states her lower lumbar back is mildly curved and has bulging disc and narrowing foramen.    Prior note: She reports her lower back and upper hip pain has resolved. Pain now is located mostly distal portion of femur and left knee. She states getting up after sitting for awhile makes the pain unbearable. She again still denies any injury she can think of.  She had taken the prednisone , used heating pain and muscle relaxer. Pain no longer extends below her knee either.    Prior note: complaints of left leg pain of 2 days duration.  Associated symptoms include radiation of pain to the top of her foot. Patient reports she was out gardening over the weekend and had lifted some heavier objects in the yard.  Her pain started on Monday.  She has not had pain like this in the past.  She states the pain is not in her back but is mostly radiating from her upper lateral hip area down to the top of her foot. She had taken a few ibuprofen help ease off the pain, but it did not help  resolve the discomfort.      01/14/2023    8:30 AM 01/06/2023    3:34 PM 09/09/2022   10:03 AM 08/19/2022    8:01 AM 06/09/2022   10:26 AM  Depression screen PHQ 2/9  Decreased Interest 1 0 0 0 1  Down, Depressed, Hopeless 1 0 0 0 0  PHQ - 2 Score 2 0 0 0 1  Altered sleeping 1    1  Tired, decreased energy 1    1  Change in appetite 0    0  Feeling bad or failure about yourself  0    0  Trouble concentrating 0    1  Moving slowly or fidgety/restless 0    0  Suicidal thoughts 0    0  PHQ-9 Score 4    4  Difficult doing work/chores Not difficult at all        Allergies  Allergen Reactions   Terconazole Nausea Only   Fosamax  [Alendronate ] Diarrhea and Nausea Only   Social History   Social History Narrative   Not on file   Past Medical History:  Diagnosis Date   Abnormal Pap smear of cervix 1980's   resolved post cryo with normal follow up   Depression    Elevated alkaline phosphatase level 05/15/2019   Estrogen deficiency 05/15/2019   Gallstones  08/04/2012   GERD (gastroesophageal reflux disease) 11/09/2019   Hypertension    Lymphadenopathy 08/02/2019   Type 2 diabetes mellitus without complication, without long-term current use of insulin (HCC) 01/18/2018   Past Surgical History:  Procedure Laterality Date   CERVIX LESION DESTRUCTION  1980s   CHOLECYSTECTOMY N/A 01/13/2021   Procedure: LAPAROSCOPIC CHOLECYSTECTOMY WITH INTRAOPERATIVE CHOLANGIOGRAM;  Surgeon: Curvin Deward MOULD, MD;  Location: [facility] OR;  Service: General;  Laterality: N/A;   COLPOSCOPY  1980's   Family History  Problem Relation Age of Onset   Diabetes Mother    Arthritis Mother    COPD Father    Asthma Father    Heart disease Father    Diabetes Sister    Asthma Brother    COPD Sister    Rheum arthritis Sister    Pulmonary fibrosis Sister        Passed away 04/05/20  Breast cancer Maternal Aunt    Alcohol abuse Son    Allergies as of 05/10/2023       Reactions   Terconazole Nausea Only    Fosamax  [alendronate ] Diarrhea, Nausea Only        Medication List        Accurate as of May 10, 2023  4:14 PM. If you have any questions, ask your nurse or doctor.          STOP taking these medications    predniSONE  20 MG tablet Commonly known as: DELTASONE  Stopped by: Charlies Bellini       TAKE these medications    amLODipine  2.5 MG tablet Commonly known as: NORVASC  Take 1 tablet (2.5 mg total) by mouth daily.   cholecalciferol 25 MCG (1000 UNIT) tablet Commonly known as: VITAMIN D3 Take 2,000 Units by mouth daily.   cyanocobalamin  1000 MCG tablet Commonly known as: VITAMIN B12 Take 1,000 mcg by mouth daily.   Debrox 6.5 % OTIC solution Generic drug: carbamide peroxide 3-5 drops left ear once a day for 5-7 days as needed.   Diclofenac  Sodium CR 100 MG 24 hr tablet Take 1 tablet (100 mg total) by mouth daily. Started by: Moe Brier   fluticasone  50 MCG/ACT nasal spray Commonly known as: FLONASE  Place 2 sprays into both nostrils in the morning and at bedtime.   lisinopril  40 MG tablet Commonly known as: ZESTRIL  Take 1 tablet (40 mg total) by mouth daily.   metFORMIN  1000 MG tablet Commonly known as: GLUCOPHAGE  Take 1 tablet (1,000 mg total) by mouth daily with breakfast.   naproxen  500 MG tablet Commonly known as: NAPROSYN  TAKE 1 TABLET BY MOUTH 2 TIMES DAILY WITH A MEAL.   tiZANidine  4 MG tablet Commonly known as: Zanaflex  Take 0.5-1 tablets (2-4 mg total) by mouth every 8 (eight) hours as needed for muscle spasms.        All past medical history, surgical history, allergies, family history, immunizations andmedications were updated in the EMR today and reviewed under the history and medication portions of their EMR.     ROS Negative, with the exception of above mentioned in HPI   Objective:  BP (!) 142/88   Pulse 78   Temp 97.8 F (36.6 C)   Wt 172 lb (78 kg)   LMP 04/27/2005 (Approximate)   SpO2 98%   BMI 30.86 kg/m  Body  mass index is 30.86 kg/m.  Physical Exam Vitals and nursing note reviewed.  Constitutional:      General: She is not in acute distress.  Appearance: Normal appearance. She is normal weight. She is not ill-appearing or toxic-appearing.  HENT:     Head: Normocephalic and atraumatic.  Eyes:     General: No scleral icterus.       Right eye: No discharge.        Left eye: No discharge.     Extraocular Movements: Extraocular movements intact.     Conjunctiva/sclera: Conjunctivae normal.     Pupils: Pupils are equal, round, and reactive to light.  Skin:    Findings: No rash.  Neurological:     Mental Status: She is alert and oriented to person, place, and time. Mental status is at baseline.     Motor: No weakness.     Coordination: Coordination normal.     Gait: Gait normal.  Psychiatric:        Mood and Affect: Mood normal.        Behavior: Behavior normal.        Thought Content: Thought content normal.        Judgment: Judgment normal.     No results found. No results found. No results found for this or any previous visit (from the past 24 hours).  Assessment/Plan: NAJAT OLAZABAL is a 68 y.o. female present for OV for  Vitamin B12 deficiency B12 injection provided today  Lumbar radiculopathy (Primary) Discussed trial of diclofenac  extended release daily with food. Continue to follow with Dr. Rowan/Dr. Cesario for management.  Hopefully she will see some good results from her epidural injections.   Reviewed expectations re: course of current medical issues. Discussed self-management of symptoms. Outlined signs and symptoms indicating need for more acute intervention. Patient verbalized understanding and all questions were answered. Patient received an After-Visit Summary.    No orders of the defined types were placed in this encounter.  Meds ordered this encounter  Medications   Diclofenac  Sodium CR 100 MG 24 hr tablet    Sig: Take 1 tablet (100 mg total) by  mouth daily.    Dispense:  90 tablet    Refill:  1   Referral Orders  No referral(s) requested today     Note is dictated utilizing voice recognition software. Although note has been proof read prior to signing, occasional typographical errors still can be missed. If any questions arise, please do not hesitate to call for verification.   electronically signed by:  Charlies Bellini, DO  [facility] - [place]

## 2023-05-10 NOTE — Patient Instructions (Signed)

## 2023-05-10 NOTE — Telephone Encounter (Signed)
 Copied from CRM [id number]. Topic: Clinical - Prescription Issue >> May 10, 2023 11:02 AM Antonio DEL wrote: Reason for CRM: Patient is wanting to know if prescription for Diclofenac  Sodium CR 100 MG 24 hr tablet can be changed to 75mg . [facility] and the 75mg  will be cheaper, around $8. Th100mg  is $117 and that is a bit much for the patient.

## 2023-05-11 ENCOUNTER — Telehealth: Payer: Self-pay

## 2023-05-11 MED ORDER — DICLOFENAC SODIUM 75 MG PO TBEC: 75.0000 mg | DELAYED_RELEASE_TABLET | Freq: Two times a day (BID) | ORAL | 1 refills | Status: DC

## 2023-05-11 MED ORDER — CYANOCOBALAMIN 1000 MCG/ML IJ SOLN
1000.0000 ug | Freq: Once | INTRAMUSCULAR | Status: AC
Start: 2023-05-11 — End: 2023-05-10
  Administered 2023-05-10: 1000 ug via INTRAMUSCULAR

## 2023-05-11 NOTE — Addendum Note (Signed)
 Addended by: Filomena Jungling on: 05/11/2023 09:14 AM   Modules accepted: Orders

## 2023-05-11 NOTE — Telephone Encounter (Signed)
 Pt made aware

## 2023-05-11 NOTE — Telephone Encounter (Signed)
 Reason for CRM: Patient is calling to check the status of her Diclofenac Sodium CR 100 MG 24 hr tablet being changed to 75mg  due to the price.        100mg  is $117, 75mg  is $8.00    Please advise.

## 2023-05-11 NOTE — Addendum Note (Signed)
 Addended by: Felix Pacini A on: 05/11/2023 03:01 PM   Modules accepted: Orders

## 2023-05-11 NOTE — Telephone Encounter (Signed)
 I called in the diclofenac 75 mg tabs for her since they are cheaper.  However, the diclofenac 75 mg are the short acting tabs and are taken every 12 hours.

## 2023-05-13 DIAGNOSIS — M5416 Radiculopathy, lumbar region: Secondary | ICD-10-CM | POA: Diagnosis not present

## 2023-05-31 DIAGNOSIS — M5416 Radiculopathy, lumbar region: Secondary | ICD-10-CM | POA: Diagnosis not present

## 2023-06-01 ENCOUNTER — Ambulatory Visit: Payer: PPO | Admitting: Cardiovascular Disease

## 2023-06-09 ENCOUNTER — Ambulatory Visit: Payer: PPO

## 2023-06-10 ENCOUNTER — Ambulatory Visit: Payer: PPO

## 2023-06-10 ENCOUNTER — Ambulatory Visit: Payer: PPO | Admitting: Family Medicine

## 2023-06-10 ENCOUNTER — Encounter: Payer: Self-pay | Admitting: Family Medicine

## 2023-06-10 VITALS — BP 140/78 | HR 70 | Temp 98.2°F | Wt 173.2 lb

## 2023-06-10 DIAGNOSIS — E538 Deficiency of other specified B group vitamins: Secondary | ICD-10-CM | POA: Diagnosis not present

## 2023-06-10 DIAGNOSIS — H9209 Otalgia, unspecified ear: Secondary | ICD-10-CM | POA: Diagnosis not present

## 2023-06-10 MED ORDER — CYANOCOBALAMIN 1000 MCG/ML IJ SOLN
1000.0000 ug | Freq: Once | INTRAMUSCULAR | Status: AC
Start: 2023-06-10 — End: 2023-11-05
  Administered 2023-11-05: 1000 ug via INTRAMUSCULAR

## 2023-06-10 NOTE — Progress Notes (Signed)
Maria Watkins , 1955/11/25, 68 y.o., female MRN: [id number] Patient Care Team    Relationship Specialty Notifications Start End  Natalia Leatherwood, DO PCP - General Family Medicine  12/26/21   Charna Elizabeth, MD Consulting Physician Gastroenterology  01/18/18   Marlow Baars, MD Consulting Physician Obstetrics  01/18/18   Jill Side, OD Referring Physician   04/09/21   Gean Birchwood, MD Consulting Physician Orthopedic Surgery  06/10/23   Claria Dice, MD Attending Physician Physical Medicine and Rehabilitation  06/10/23    Comment: lumbar epi inj    Chief Complaint  Patient presents with   Ear Pain    A few days; ear ache, scratchy throat. Denies any cough, congestion, or fever. Pt has taken Tylenol for soreness.      Subjective: Maria Watkins is a 68 y.o. Pt presents for an OV with complaints of earache, scratchy throat of 3d duration.  Associated symptoms include nothing. Pt has tried tylenol to ease their symptoms.      06/10/2023   10:17 AM 01/14/2023    8:30 AM 01/06/2023    3:34 PM 09/09/2022   10:03 AM 08/19/2022    8:01 AM  Depression screen PHQ 2/9  Decreased Interest 1 1 0 0 0  Down, Depressed, Hopeless 1 1 0 0 0  PHQ - 2 Score 2 2 0 0 0  Altered sleeping 1 1     Tired, decreased energy 1 1     Change in appetite 0 0     Feeling bad or failure about yourself  0 0     Trouble concentrating 0 0     Moving slowly or fidgety/restless 1 0     Suicidal thoughts 0 0     PHQ-9 Score 5 4     Difficult doing work/chores Somewhat difficult Not difficult at all       Allergies  Allergen Reactions   Terconazole Nausea Only   Fosamax [Alendronate] Diarrhea and Nausea Only   Social History   Social History Narrative   Not on file   Past Medical History:  Diagnosis Date   Abnormal Pap smear of cervix 1980's   resolved post cryo with normal follow up   Depression    Elevated alkaline phosphatase level 05/15/2019   Estrogen deficiency 05/15/2019   Gallstones  08/04/2012   GERD (gastroesophageal reflux disease) 11/09/2019   Hypertension    Lymphadenopathy 08/02/2019   Type 2 diabetes mellitus without complication, without long-term current use of insulin (HCC) 01/18/2018   Past Surgical History:  Procedure Laterality Date   CERVIX LESION DESTRUCTION  1980s   CHOLECYSTECTOMY N/A 01/13/2021   Procedure: LAPAROSCOPIC CHOLECYSTECTOMY WITH INTRAOPERATIVE CHOLANGIOGRAM;  Surgeon: Griselda Miner, MD;  Location: [facility] OR;  Service: General;  Laterality: N/A;   COLPOSCOPY  1980's   LUMBAR EPIDURAL INJECTION     L4-L5, May end up also with L2-3 in the future- Dr. Regino Schultze   Family History  Problem Relation Age of Onset   Diabetes Mother    Arthritis Mother    COPD Father    Asthma Father    Heart disease Father    Diabetes Sister    Asthma Brother    COPD Sister    Rheum arthritis Sister    Pulmonary fibrosis Sister        Passed away 2020-05-03  Breast cancer Maternal Aunt    Alcohol abuse Son    Allergies as of 06/10/2023  Reactions   Terconazole Nausea Only   Fosamax [alendronate] Diarrhea, Nausea Only        Medication List        Accurate as of June 10, 2023 10:32 AM. If you have any questions, ask your nurse or doctor.          amLODipine 2.5 MG tablet Commonly known as: NORVASC Take 1 tablet (2.5 mg total) by mouth daily.   cholecalciferol 25 MCG (1000 UNIT) tablet Commonly known as: VITAMIN D3 Take 2,000 Units by mouth daily.   cyanocobalamin 1000 MCG tablet Commonly known as: VITAMIN B12 Take 1,000 mcg by mouth daily.   Debrox 6.5 % OTIC solution Generic drug: carbamide peroxide 3-5 drops left ear once a day for 5-7 days as needed.   diclofenac 75 MG EC tablet Commonly known as: VOLTAREN Take 1 tablet (75 mg total) by mouth 2 (two) times daily.   fluticasone 50 MCG/ACT nasal spray Commonly known as: FLONASE Place 2 sprays into both nostrils in the morning and at bedtime.   lisinopril 40 MG  tablet Commonly known as: ZESTRIL Take 1 tablet (40 mg total) by mouth daily.   metFORMIN 1000 MG tablet Commonly known as: GLUCOPHAGE Take 1 tablet (1,000 mg total) by mouth daily with breakfast.   naproxen 500 MG tablet Commonly known as: NAPROSYN TAKE 1 TABLET BY MOUTH 2 TIMES DAILY WITH A MEAL.   tiZANidine 4 MG tablet Commonly known as: Zanaflex Take 0.5-1 tablets (2-4 mg total) by mouth every 8 (eight) hours as needed for muscle spasms.        All past medical history, surgical history, allergies, family history, immunizations andmedications were updated in the EMR today and reviewed under the history and medication portions of their EMR.     Review of Systems  Constitutional:  Negative for chills, fever and malaise/fatigue.  HENT:  Positive for ear pain and sore throat. Negative for congestion.   Eyes:  Negative for pain and discharge.  Respiratory:  Negative for cough.   Gastrointestinal:  Negative for diarrhea, nausea and vomiting.  Musculoskeletal:  Negative for myalgias.  Skin:  Negative for rash.  Neurological:  Negative for dizziness and headaches.   Negative, with the exception of above mentioned in HPI   Objective:  BP (!) 140/78   Pulse 70   Temp 98.2 F (36.8 C)   Wt 173 lb 3.2 oz (78.6 kg)   LMP 04/27/2005 (Approximate)   SpO2 99%   BMI 31.08 kg/m  Body mass index is 31.08 kg/m. Physical Exam Vitals and nursing note reviewed.  Constitutional:      General: She is not in acute distress.    Appearance: Normal appearance. She is normal weight. She is not ill-appearing or toxic-appearing.  HENT:     Head: Normocephalic and atraumatic.  Eyes:     General: No scleral icterus.       Right eye: No discharge.        Left eye: No discharge.     Extraocular Movements: Extraocular movements intact.     Conjunctiva/sclera: Conjunctivae normal.     Pupils: Pupils are equal, round, and reactive to light.  Skin:    Findings: No rash.  Neurological:      Mental Status: She is alert and oriented to person, place, and time. Mental status is at baseline.     Motor: No weakness.     Coordination: Coordination normal.     Gait: Gait normal.  Psychiatric:  Mood and Affect: Mood normal.        Behavior: Behavior normal.        Thought Content: Thought content normal.        Judgment: Judgment normal.    No results found. No results found. No results found for this or any previous visit (from the past 24 hours).  Assessment/Plan: MELIKA REDER is a 68 y.o. female present for OV for  Otalgia No  signs of infection today Rest, hydrate.  Start antihistamine OTC Can use debrox to help with mild cerumen.   B12 deficiency: B12 injection due and  provided today  Reviewed expectations re: course of current medical issues. Discussed self-management of symptoms. Outlined signs and symptoms indicating need for more acute intervention. Patient verbalized understanding and all questions were answered. Patient received an After-Visit Summary.    No orders of the defined types were placed in this encounter.  Meds ordered this encounter  Medications   cyanocobalamin (VITAMIN B12) injection 1,000 mcg   Referral Orders  No referral(s) requested today     Note is dictated utilizing voice recognition software. Although note has been proof read prior to signing, occasional typographical errors still can be missed. If any questions arise, please do not hesitate to call for verification.   electronically signed by:  Felix Pacini, DO  [facility] - [place]

## 2023-06-10 NOTE — Patient Instructions (Signed)

## 2023-06-21 DIAGNOSIS — Z7984 Long term (current) use of oral hypoglycemic drugs: Secondary | ICD-10-CM | POA: Diagnosis not present

## 2023-06-21 DIAGNOSIS — E119 Type 2 diabetes mellitus without complications: Secondary | ICD-10-CM | POA: Diagnosis not present

## 2023-06-21 DIAGNOSIS — H2513 Age-related nuclear cataract, bilateral: Secondary | ICD-10-CM | POA: Diagnosis not present

## 2023-06-21 LAB — HM DIABETES EYE EXAM

## 2023-07-07 ENCOUNTER — Ambulatory Visit

## 2023-07-08 ENCOUNTER — Ambulatory Visit: Payer: PPO

## 2023-07-20 DIAGNOSIS — Z01419 Encounter for gynecological examination (general) (routine) without abnormal findings: Secondary | ICD-10-CM | POA: Diagnosis not present

## 2023-07-20 DIAGNOSIS — Z1231 Encounter for screening mammogram for malignant neoplasm of breast: Secondary | ICD-10-CM | POA: Diagnosis not present

## 2023-07-20 LAB — HM MAMMOGRAPHY

## 2023-08-04 ENCOUNTER — Ambulatory Visit (INDEPENDENT_AMBULATORY_CARE_PROVIDER_SITE_OTHER)

## 2023-08-04 DIAGNOSIS — E538 Deficiency of other specified B group vitamins: Secondary | ICD-10-CM | POA: Diagnosis not present

## 2023-08-04 MED ORDER — CYANOCOBALAMIN 1000 MCG/ML IJ SOLN
1000.0000 ug | Freq: Once | INTRAMUSCULAR | Status: AC
  Administered 2023-08-04: 1000 ug via INTRAMUSCULAR

## 2023-08-04 NOTE — Progress Notes (Signed)
 Pt here for monthly B12 injection per Dr.Kuneff  B12 given IM left deltoid, and pt tolerated injection well.  Next B12 injection scheduled for N/A; pt has appt with PCP 08/25/23.

## 2023-08-20 ENCOUNTER — Other Ambulatory Visit: Payer: Self-pay | Admitting: Family Medicine

## 2023-08-20 NOTE — Telephone Encounter (Signed)
 Copied from CRM [id number]. Topic: Clinical - Medication Refill >> Aug 20, 2023  2:05 PM Varney Gentleman wrote: Most Recent Primary Care Visit:  Provider: Terris Fickle D  Department: [facility]  Visit Type: CLINICAL SUPPORT  Date: 08/04/2023  Medication: amLODipine  (NORVASC ) 2.5 MG tablet  Has the patient contacted their pharmacy? Yes, states they've faxed over a request no response (Agent: If no, request that the patient contact the pharmacy for the refill. If patient does not wish to contact the pharmacy document the reason why and proceed with request.) (Agent: If yes, when and what did the pharmacy advise?)  Is this the correct pharmacy for this prescription? Yes If no, delete pharmacy and type the correct one.  This is the patient's preferred pharmacy:  [id number] - [place], [place] - [address] [place] [address] Phone: [phone] Fax: [phone]   Has the prescription been filled recently? No  Is the patient out of the medication? Yes  Has the patient been seen for an appointment in the last year OR does the patient have an upcoming appointment? Yes  Can we respond through MyChart? Yes  Agent: Please be advised that Rx refills may take up to 3 business days. We ask that you follow-up with your pharmacy.

## 2023-08-23 ENCOUNTER — Other Ambulatory Visit: Payer: Self-pay

## 2023-08-23 MED ORDER — AMLODIPINE BESYLATE 2.5 MG PO TABS: 2.5000 mg | ORAL_TABLET | Freq: Every day | ORAL | 0 refills | Status: DC

## 2023-08-25 ENCOUNTER — Encounter: Payer: PPO | Admitting: Family Medicine

## 2023-08-25 DIAGNOSIS — M5416 Radiculopathy, lumbar region: Secondary | ICD-10-CM | POA: Diagnosis not present

## 2023-08-30 ENCOUNTER — Encounter: Payer: Self-pay | Admitting: Obstetrics

## 2023-08-30 ENCOUNTER — Encounter: Payer: Self-pay | Admitting: Family Medicine

## 2023-08-30 ENCOUNTER — Ambulatory Visit: Admitting: Family Medicine

## 2023-08-30 VITALS — BP 136/78 | HR 72 | Temp 98.2°F | Wt 171.8 lb

## 2023-08-30 DIAGNOSIS — R809 Proteinuria, unspecified: Secondary | ICD-10-CM

## 2023-08-30 DIAGNOSIS — J329 Chronic sinusitis, unspecified: Secondary | ICD-10-CM | POA: Diagnosis not present

## 2023-08-30 DIAGNOSIS — E538 Deficiency of other specified B group vitamins: Secondary | ICD-10-CM

## 2023-08-30 DIAGNOSIS — R051 Acute cough: Secondary | ICD-10-CM

## 2023-08-30 DIAGNOSIS — B9689 Other specified bacterial agents as the cause of diseases classified elsewhere: Secondary | ICD-10-CM

## 2023-08-30 LAB — MICROALBUMIN / CREATININE URINE RATIO
Creatinine,U: 216.6 mg/dL
Microalb Creat Ratio: 14.8 mg/g (ref 0.0–30.0)
Microalb, Ur: 3.2 mg/dL — ABNORMAL HIGH (ref 0.0–1.9)

## 2023-08-30 MED ORDER — AMOXICILLIN 400 MG/5ML PO SUSR: 500.0000 mg | Freq: Three times a day (TID) | ORAL | 0 refills | Status: AC

## 2023-08-30 MED ORDER — BENZONATATE 200 MG PO CAPS: 200.0000 mg | ORAL_CAPSULE | Freq: Two times a day (BID) | ORAL | 0 refills | Status: DC | PRN

## 2023-08-30 MED ORDER — CYANOCOBALAMIN 1000 MCG/ML IJ SOLN
1000.0000 ug | Freq: Once | INTRAMUSCULAR | Status: AC
  Administered 2023-08-30: 1000 ug via INTRAMUSCULAR

## 2023-08-30 NOTE — Progress Notes (Signed)
 Maria Watkins , [birth year], 68 y.o., female MRN: [id number] Patient Care Team    Relationship Specialty Notifications Start End  Mariel Shope, DO PCP - General Family Medicine  12/26/21   Tami Falcon, MD Consulting Physician Gastroenterology  01/18/18   Luan Rumpf, MD Consulting Physician Obstetrics  01/18/18   Ranny Bye, OD Referring Physician   04/09/21   Wendolyn Hamburger, MD Consulting Physician Orthopedic Surgery  06/10/23   Cindee Crazier, MD Attending Physician Physical Medicine and Rehabilitation  06/10/23    Comment: lumbar epi inj    Chief Complaint  Patient presents with   Cough    Started 4/30; cough, congestion, sore throat, drainage. Pt has taken OTC cough and cold.      Subjective: BOBI Maria Watkins is a 68 y.o. Pt presents for an OV with complaints of head cold and cough of 5 days duration.  Associated symptoms include nasal congestion, sore throat and drainage.. (See ROS) Pt has tried OTC cough med to ease their symptoms.   Microalbuminuria: Over 1 year ago today patient had a microalbumin which patient is calculated by the lab is a microalbumin creatinine ratio of 4.5, actual result is 45.  Patient has history of hypertension and diabetes, has been hesitant to medication suggestions for conditions.     06/10/2023   10:17 AM 01/14/2023    8:30 AM 01/06/2023    3:34 PM 09/09/2022   10:03 AM 08/19/2022    8:01 AM  Depression screen PHQ 2/9  Decreased Interest 1 1 0 0 0  Down, Depressed, Hopeless 1 1 0 0 0  PHQ - 2 Score 2 2 0 0 0  Altered sleeping 1 1     Tired, decreased energy 1 1     Change in appetite 0 0     Feeling bad or failure about yourself  0 0     Trouble concentrating 0 0     Moving slowly or fidgety/restless 1 0     Suicidal thoughts 0 0     PHQ-9 Score 5 4     Difficult doing work/chores Somewhat difficult Not difficult at all       Allergies  Allergen Reactions   Terconazole Nausea Only   Fosamax  [Alendronate ] Diarrhea and Nausea  Only   Social History   Social History Narrative   Not on file   Past Medical History:  Diagnosis Date   Abnormal Pap smear of cervix 1980's   resolved post cryo with normal follow up   Depression    Elevated alkaline phosphatase level 05/15/2019   Estrogen deficiency 05/15/2019   Gallstones 08/04/2012   GERD (gastroesophageal reflux disease) 11/09/2019   Hypertension    Lymphadenopathy 08/02/2019   Type 2 diabetes mellitus without complication, without long-term current use of insulin (HCC) 01/18/2018   Past Surgical History:  Procedure Laterality Date   CERVIX LESION DESTRUCTION  1980s   CHOLECYSTECTOMY N/A 01/13/2021   Procedure: LAPAROSCOPIC CHOLECYSTECTOMY WITH INTRAOPERATIVE CHOLANGIOGRAM;  Surgeon: Caralyn Chandler, MD;  Location: [facility] OR;  Service: General;  Laterality: N/A;   COLPOSCOPY  1980's   LUMBAR EPIDURAL INJECTION     L4-L5, May end up also with L2-3 in the future- Dr. Donna Fus   Family History  Problem Relation Age of Onset   Diabetes Mother    Arthritis Mother    COPD Father    Asthma Father    Heart disease Father    Diabetes Sister  Asthma Brother    COPD Sister    Rheum arthritis Sister    Pulmonary fibrosis Sister        Passed away 04-21-2020  Breast cancer Maternal Aunt    Alcohol abuse Son    Allergies as of 08/30/2023       Reactions   Terconazole Nausea Only   Fosamax  [alendronate ] Diarrhea, Nausea Only        Medication List        Accurate as of Aug 30, 2023 12:05 PM. If you have any questions, ask your nurse or doctor.          amLODipine  2.5 MG tablet Commonly known as: NORVASC  Take 1 tablet (2.5 mg total) by mouth daily. MUST KEEP APPT FOR FURTHER REFILLS   amoxicillin  400 MG/5ML suspension Commonly known as: AMOXIL  Take 6.3 mLs (500 mg total) by mouth 3 (three) times daily for 10 days. Started by: Napolean Backbone   benzonatate  200 MG capsule Commonly known as: TESSALON  Take 1 capsule (200 mg total) by mouth 2 (two)  times daily as needed for cough. Started by: Brysen Shankman   cholecalciferol 25 MCG (1000 UNIT) tablet Commonly known as: VITAMIN D3 Take 2,000 Units by mouth daily.   cyanocobalamin  1000 MCG tablet Commonly known as: VITAMIN B12 Take 1,000 mcg by mouth daily.   Debrox 6.5 % OTIC solution Generic drug: carbamide peroxide 3-5 drops left ear once a day for 5-7 days as needed.   diclofenac  75 MG EC tablet Commonly known as: VOLTAREN  Take 1 tablet (75 mg total) by mouth 2 (two) times daily.   fluticasone  50 MCG/ACT nasal spray Commonly known as: FLONASE  Place 2 sprays into both nostrils in the morning and at bedtime.   lisinopril  40 MG tablet Commonly known as: ZESTRIL  Take 1 tablet (40 mg total) by mouth daily.   metFORMIN  1000 MG tablet Commonly known as: GLUCOPHAGE  Take 1 tablet (1,000 mg total) by mouth daily with breakfast.   naproxen  500 MG tablet Commonly known as: NAPROSYN  TAKE 1 TABLET BY MOUTH 2 TIMES DAILY WITH A MEAL.   tiZANidine  4 MG tablet Commonly known as: Zanaflex  Take 0.5-1 tablets (2-4 mg total) by mouth every 8 (eight) hours as needed for muscle spasms.        All past medical history, surgical history, allergies, family history, immunizations andmedications were updated in the EMR today and reviewed under the history and medication portions of their EMR.     Review of Systems  Constitutional:  Positive for malaise/fatigue. Negative for chills and fever.  HENT:  Positive for congestion, sinus pain and sore throat. Negative for ear discharge and ear pain.   Eyes: Negative.   Respiratory:  Positive for cough and sputum production. Negative for shortness of breath and wheezing.   Cardiovascular: Negative.   Gastrointestinal:  Negative for diarrhea, nausea and vomiting.  Genitourinary: Negative.   Musculoskeletal: Negative.   Skin:  Negative for rash.  Neurological:  Positive for headaches. Negative for dizziness.  Endo/Heme/Allergies: Negative.    Psychiatric/Behavioral: Negative.     Negative, with the exception of above mentioned in HPI   Objective:  BP 136/78   Pulse 72   Temp 98.2 F (36.8 C)   Wt 171 lb 12.8 oz (77.9 kg)   LMP 04/27/2005 (Approximate)   SpO2 97%   BMI 30.83 kg/m  Body mass index is 30.83 kg/m. Physical Exam Vitals and nursing note reviewed.  Constitutional:      General: She  is not in acute distress.    Appearance: Normal appearance. She is normal weight. She is not ill-appearing or toxic-appearing.  HENT:     Head: Normocephalic and atraumatic.  Eyes:     General: No scleral icterus.       Right eye: No discharge.        Left eye: No discharge.     Extraocular Movements: Extraocular movements intact.     Conjunctiva/sclera: Conjunctivae normal.     Pupils: Pupils are equal, round, and reactive to light.  Skin:    Findings: No rash.  Neurological:     Mental Status: She is alert and oriented to person, place, and time. Mental status is at baseline.     Motor: No weakness.     Coordination: Coordination normal.     Gait: Gait normal.  Psychiatric:        Mood and Affect: Mood normal.        Behavior: Behavior normal.        Thought Content: Thought content normal.        Judgment: Judgment normal.     No results found. No results found. No results found for this or any previous visit (from the past 24 hours).  Assessment/Plan: LIANNAH ALTHEIDE is a 68 y.o. female present for OV for  Bacterial sinusitis (Primary)/cough Rest, hydrate.  +/- flonase , mucinex (DM if cough), nettie pot or nasal saline.  Amox sus TID prescribed, take until completed.  If cough present it can last up to 6-8 weeks.  F/U 2 weeks of not improved.   Microalbuminuria Discussed prior result.  - Urine Microalbumin w/creat. Ratio  B12 def:  B12 inj provided today   Reviewed expectations re: course of current medical issues. Discussed self-management of symptoms. Outlined signs and symptoms indicating  need for more acute intervention. Patient verbalized understanding and all questions were answered. Patient received an After-Visit Summary.    Orders Placed This Encounter  Procedures   Urine Microalbumin w/creat. ratio   Meds ordered this encounter  Medications   benzonatate  (TESSALON ) 200 MG capsule    Sig: Take 1 capsule (200 mg total) by mouth 2 (two) times daily as needed for cough.    Dispense:  20 capsule    Refill:  0   amoxicillin  (AMOXIL ) 400 MG/5ML suspension    Sig: Take 6.3 mLs (500 mg total) by mouth 3 (three) times daily for 10 days.    Dispense:  190 mL    Refill:  0   cyanocobalamin  (VITAMIN B12) injection 1,000 mcg   Referral Orders  No referral(s) requested today     Note is dictated utilizing voice recognition software. Although note has been proof read prior to signing, occasional typographical errors still can be missed. If any questions arise, please do not hesitate to call for verification.   electronically signed by:  Napolean Backbone, DO  [facility] - [place]

## 2023-08-31 ENCOUNTER — Encounter: Payer: Self-pay | Admitting: Family Medicine

## 2023-09-03 ENCOUNTER — Ambulatory Visit

## 2023-09-23 ENCOUNTER — Encounter: Payer: Self-pay | Admitting: Family Medicine

## 2023-09-23 ENCOUNTER — Ambulatory Visit: Admitting: Family Medicine

## 2023-09-23 VITALS — BP 138/82 | HR 67 | Temp 98.1°F | Ht 62.0 in | Wt 172.0 lb

## 2023-09-23 DIAGNOSIS — E1169 Type 2 diabetes mellitus with other specified complication: Secondary | ICD-10-CM

## 2023-09-23 DIAGNOSIS — Z0000 Encounter for general adult medical examination without abnormal findings: Secondary | ICD-10-CM

## 2023-09-23 DIAGNOSIS — M85852 Other specified disorders of bone density and structure, left thigh: Secondary | ICD-10-CM

## 2023-09-23 DIAGNOSIS — I10 Essential (primary) hypertension: Secondary | ICD-10-CM

## 2023-09-23 DIAGNOSIS — E559 Vitamin D deficiency, unspecified: Secondary | ICD-10-CM

## 2023-09-23 DIAGNOSIS — E538 Deficiency of other specified B group vitamins: Secondary | ICD-10-CM | POA: Diagnosis not present

## 2023-09-23 DIAGNOSIS — E785 Hyperlipidemia, unspecified: Secondary | ICD-10-CM | POA: Diagnosis not present

## 2023-09-23 DIAGNOSIS — Z803 Family history of malignant neoplasm of breast: Secondary | ICD-10-CM

## 2023-09-23 DIAGNOSIS — Z23 Encounter for immunization: Secondary | ICD-10-CM

## 2023-09-23 LAB — CBC
HCT: 39.9 % (ref 36.0–46.0)
Hemoglobin: 13.4 g/dL (ref 12.0–15.0)
MCHC: 33.6 g/dL (ref 30.0–36.0)
MCV: 85.8 fl (ref 78.0–100.0)
Platelets: 174 10*3/uL (ref 150.0–400.0)
RBC: 4.65 Mil/uL (ref 3.87–5.11)
RDW: 13.2 % (ref 11.5–15.5)
WBC: 5.6 10*3/uL (ref 4.0–10.5)

## 2023-09-23 LAB — COMPREHENSIVE METABOLIC PANEL WITH GFR
ALT: 13 U/L (ref 0–35)
AST: 15 U/L (ref 0–37)
Albumin: 4.5 g/dL (ref 3.5–5.2)
Alkaline Phosphatase: 119 U/L — ABNORMAL HIGH (ref 39–117)
BUN: 16 mg/dL (ref 6–23)
CO2: 30 meq/L (ref 19–32)
Calcium: 9.8 mg/dL (ref 8.4–10.5)
Chloride: 100 meq/L (ref 96–112)
Creatinine, Ser: 0.73 mg/dL (ref 0.40–1.20)
GFR: 83.64 mL/min (ref >60.00)
Glucose, Bld: 188 mg/dL — ABNORMAL HIGH (ref 70–99)
Potassium: 4.3 meq/L (ref 3.5–5.1)
Sodium: 137 meq/L (ref 135–145)
Total Bilirubin: 1.2 mg/dL (ref 0.2–1.2)
Total Protein: 7.4 g/dL (ref 6.0–8.3)

## 2023-09-23 LAB — LIPID PANEL
Cholesterol: 214 mg/dL — ABNORMAL HIGH (ref 0–200)
HDL: 55.5 mg/dL (ref >39.00)
LDL Cholesterol: 111 mg/dL — ABNORMAL HIGH (ref 0–99)
NonHDL: 158.96
Total CHOL/HDL Ratio: 4
Triglycerides: 238 mg/dL — ABNORMAL HIGH (ref 0.0–149.0)
VLDL: 47.6 mg/dL — ABNORMAL HIGH (ref 0.0–40.0)

## 2023-09-23 LAB — VITAMIN B12: Vitamin B-12: 684 pg/mL (ref 211–911)

## 2023-09-23 LAB — TSH: TSH: 1.23 u[IU]/mL (ref 0.35–5.50)

## 2023-09-23 LAB — HEMOGLOBIN A1C: Hgb A1c MFr Bld: 8.3 % — ABNORMAL HIGH (ref 4.6–6.5)

## 2023-09-23 LAB — VITAMIN D 25 HYDROXY (VIT D DEFICIENCY, FRACTURES): VITD: 22.56 ng/mL — ABNORMAL LOW (ref 30.00–100.00)

## 2023-09-23 MED ORDER — AMLODIPINE BESYLATE 2.5 MG PO TABS: 2.5000 mg | ORAL_TABLET | Freq: Every day | ORAL | 1 refills | Status: DC

## 2023-09-23 MED ORDER — METFORMIN HCL 1000 MG PO TABS: 1000.0000 mg | ORAL_TABLET | Freq: Every day | ORAL | 1 refills | Status: DC

## 2023-09-23 MED ORDER — TETANUS-DIPHTH-ACELL PERTUSSIS 5-2.5-18.5 LF-MCG/0.5 IM SUSP
0.5000 mL | Freq: Once | INTRAMUSCULAR | 0 refills | Status: AC
Start: 2023-09-23 — End: 2023-09-23

## 2023-09-23 MED ORDER — DICLOFENAC SODIUM 75 MG PO TBEC: 75.0000 mg | DELAYED_RELEASE_TABLET | Freq: Two times a day (BID) | ORAL | 1 refills | Status: DC

## 2023-09-23 MED ORDER — LISINOPRIL 40 MG PO TABS: 40.0000 mg | ORAL_TABLET | Freq: Every day | ORAL | 1 refills | Status: DC

## 2023-09-23 MED ORDER — FLUTICASONE PROPIONATE 50 MCG/ACT NA SUSP: 2.0000 | Freq: Two times a day (BID) | NASAL | 11 refills | Status: AC

## 2023-09-23 NOTE — Progress Notes (Signed)
 Patient ID: Maria Watkins, female  DOB: 30-May-1955, 68 y.o.   MRN: [id number] Patient Care Team    Relationship Specialty Notifications Start End  Mariel Shope, DO PCP - General Family Medicine  12/26/21   Tami Falcon, MD Consulting Physician Gastroenterology  01/18/18   Luan Rumpf, MD Consulting Physician Obstetrics  01/18/18   Ranny Bye, OD Referring Physician   04/09/21   Wendolyn Hamburger, MD Consulting Physician Orthopedic Surgery  06/10/23   Cindee Crazier, MD Attending Physician Physical Medicine and Rehabilitation  06/10/23    Comment: lumbar epi inj    Chief Complaint  Patient presents with   Annual Exam    Chronic Conditions/illness Management.  Pt is fasting.     Subjective: Maria Watkins is a 68 y.o.  Female  present for cpe and Chronic Conditions/illness Management  Health maintenance:  Colonoscopy: completed 08/27/2014, by Dr. Tova Fresh. follow up 10 yr. Mammogram: completed:07/20/2023 - Dr. Clark-gynecology Immunizations: tdap due today -printed, Influenza UTD (encouraged yearly), PNA series completed - Pna20 due 2026 (5yrp and 63), shingrix declined Infectious disease screening: Hep C completed 2017 DEXA: last completed 08/2021, result osteopenia (-1.6)-ordered today [facility].  Patient declined Fosamax  start Patient has a Dental home. Hospitalizations/ED visits: reviewed  Essential hypertension/morbid obesity/palpitations/HLD/valve regurg/enlarged aorta Pt reports compliance with lisinopril  40 mg daily and amlodipine  2.5  mg daily.  Patient denies chest pain, shortness of breath, dizziness or lower extremity edema.   Diet: Regular Exercise: Routine exercise RF: Hypertension, diabetes, obesity, former smoker, family history of heart disease   diabetes: Patient reports compliance with metformin  1000 mg qd.  Reports Farxiga  caused her dizziness.  She does not want to start this medication at this time. Patient denies dizziness, hyperglycemic or hypoglycemic events.  Patient denies numbness, tingling in the extremities or nonhealing wounds of feet.      09/23/2023    7:42 AM 06/10/2023   10:17 AM 01/14/2023    8:30 AM 01/06/2023    3:34 PM 09/09/2022   10:03 AM  Depression screen PHQ 2/9  Decreased Interest 1 1 1  0 0  Down, Depressed, Hopeless 0 1 1 0 0  PHQ - 2 Score 1 2 2  0 0  Altered sleeping 1 1 1     Tired, decreased energy 1 1 1     Change in appetite 0 0 0    Feeling bad or failure about yourself  1 0 0    Trouble concentrating 0 0 0    Moving slowly or fidgety/restless 0 1 0    Suicidal thoughts 0 0 0    PHQ-9 Score 4 5 4     Difficult doing work/chores Somewhat difficult Somewhat difficult Not difficult at all        09/23/2023    7:42 AM 06/10/2023   10:17 AM 01/14/2023    8:31 AM 08/19/2022    8:02 AM  GAD 7 : Generalized Anxiety Score  Nervous, Anxious, on Edge 1 1 1  0  Control/stop worrying 1 1 1 1   Worry too much - different things 1 0 0 0  Trouble relaxing 1 0 0 0  Restless 0 0 0 0  Easily annoyed or irritable 1 0 0 0  Afraid - awful might happen 1 0 1 0  Total GAD 7 Score 6 2 3 1   Anxiety Difficulty Somewhat difficult Not difficult at all Not difficult at all      Immunization History  Administered Date(s) Administered   Fluad Quad(high Dose  65+) 01/16/2019, 03/19/2021, 02/04/2022   Fluad Trivalent(High Dose 65+) 02/15/2023   Influenza Inj Mdck Quad With Preservative 12/27/2018   Influenza, High Dose Seasonal PF 01/16/2019, 02/20/2020   Influenza,inj,Quad PF,6+ Mos 01/18/2018   Moderna Sars-Covid-2 Vaccination 06/08/2019, 07/07/2019   Pneumococcal Conjugate-13 02/23/2020   Pneumococcal Polysaccharide-23 04/14/2018   Tdap 04/27/2013     Past Medical History:  Diagnosis Date   Abnormal Pap smear of cervix 1980's   resolved post cryo with normal follow up   Depression    Elevated alkaline phosphatase level 05/15/2019   Estrogen deficiency 05/15/2019   Gallstones 08/04/2012   GERD (gastroesophageal reflux disease)  11/09/2019   Hypertension    Lymphadenopathy 08/02/2019   Type 2 diabetes mellitus without complication, without long-term current use of insulin (HCC) 01/18/2018   Allergies  Allergen Reactions   Terconazole Nausea Only   Fosamax  [Alendronate ] Diarrhea and Nausea Only   Past Surgical History:  Procedure Laterality Date   CERVIX LESION DESTRUCTION  1980s   CHOLECYSTECTOMY N/A 01/13/2021   Procedure: LAPAROSCOPIC CHOLECYSTECTOMY WITH INTRAOPERATIVE CHOLANGIOGRAM;  Surgeon: Caralyn Chandler, MD;  Location: [facility] OR;  Service: General;  Laterality: N/A;   COLPOSCOPY  1980's   LUMBAR EPIDURAL INJECTION     L4-L5, May end up also with L2-3 in the future- Dr. Donna Fus   Family History  Problem Relation Age of Onset   Diabetes Mother    Arthritis Mother    COPD Father    Asthma Father    Heart disease Father    Diabetes Sister    Asthma Brother    COPD Sister    Rheum arthritis Sister    Pulmonary fibrosis Sister        Passed away 2020-04-23  Breast cancer Maternal Aunt    Alcohol abuse Son    Social History   Social History Narrative   Not on file    Allergies as of 09/23/2023       Reactions   Terconazole Nausea Only   Fosamax  [alendronate ] Diarrhea, Nausea Only        Medication List        Accurate as of Sep 23, 2023  8:05 AM. If you have any questions, ask your nurse or doctor.          STOP taking these medications    benzonatate  200 MG capsule Commonly known as: TESSALON  Stopped by: Laneta Guerin   cyanocobalamin  1000 MCG tablet Commonly known as: VITAMIN B12 Stopped by: Napolean Backbone   Debrox 6.5 % OTIC solution Generic drug: carbamide peroxide Stopped by: Napolean Backbone   naproxen  500 MG tablet Commonly known as: NAPROSYN  Stopped by: Napolean Backbone       TAKE these medications    amLODipine  2.5 MG tablet Commonly known as: NORVASC  Take 1 tablet (2.5 mg total) by mouth daily. What changed: additional instructions Changed by: Napolean Backbone    cholecalciferol 25 MCG (1000 UNIT) tablet Commonly known as: VITAMIN D3 Take 2,000 Units by mouth daily.   diclofenac  75 MG EC tablet Commonly known as: VOLTAREN  Take 1 tablet (75 mg total) by mouth 2 (two) times daily.   fluticasone  50 MCG/ACT nasal spray Commonly known as: FLONASE  Place 2 sprays into both nostrils in the morning and at bedtime.   lisinopril  40 MG tablet Commonly known as: ZESTRIL  Take 1 tablet (40 mg total) by mouth daily.   metFORMIN  1000 MG tablet Commonly known as: GLUCOPHAGE  Take 1 tablet (1,000 mg total) by mouth daily with  breakfast.   Tdap 5-2.5-18.5 LF-MCG/0.5 injection Commonly known as: BOOSTRIX Inject 0.5 mLs into the muscle once for 1 dose. Started by: Radiah Lubinski   tiZANidine  4 MG tablet Commonly known as: Zanaflex  Take 0.5-1 tablets (2-4 mg total) by mouth every 8 (eight) hours as needed for muscle spasms.        All past medical history, surgical history, allergies, family history, immunizations andmedications were updated in the EMR today and reviewed under the history and medication portions of their EMR.        ROS 14 pt review of systems performed and negative (unless mentioned in an HPI)  Objective: BP 138/82   Pulse 67   Temp 98.1 F (36.7 C)   Ht 5\' 2"  (1.575 m)   Wt 172 lb (78 kg)   LMP 04/27/2005 (Approximate)   SpO2 98%   BMI 31.46 kg/m  Physical Exam Vitals and nursing note reviewed.  Constitutional:      General: She is not in acute distress.    Appearance: Normal appearance. She is obese. She is not ill-appearing or toxic-appearing.  HENT:     Head: Normocephalic and atraumatic.     Right Ear: Tympanic membrane, ear canal and external ear normal. There is no impacted cerumen.     Left Ear: Tympanic membrane, ear canal and external ear normal. There is no impacted cerumen.     Nose: No congestion or rhinorrhea.     Mouth/Throat:     Mouth: Mucous membranes are moist.     Pharynx: Oropharynx is clear. No  oropharyngeal exudate or posterior oropharyngeal erythema.  Eyes:     General: No scleral icterus.       Right eye: No discharge.        Left eye: No discharge.     Extraocular Movements: Extraocular movements intact.     Conjunctiva/sclera: Conjunctivae normal.     Pupils: Pupils are equal, round, and reactive to light.  Cardiovascular:     Rate and Rhythm: Normal rate and regular rhythm.     Pulses: Normal pulses.     Heart sounds: Normal heart sounds. No murmur heard.    No friction rub. No gallop.  Pulmonary:     Effort: Pulmonary effort is normal. No respiratory distress.     Breath sounds: Normal breath sounds. No stridor. No wheezing, rhonchi or rales.  Chest:     Chest wall: No tenderness.  Abdominal:     General: Abdomen is flat. Bowel sounds are normal. There is no distension.     Palpations: Abdomen is soft. There is no mass.     Tenderness: There is no abdominal tenderness. There is no right CVA tenderness, left CVA tenderness, guarding or rebound.     Hernia: No hernia is present.  Musculoskeletal:        General: No swelling, tenderness or deformity. Normal range of motion.     Cervical back: Normal range of motion and neck supple. No rigidity or tenderness.     Right lower leg: No edema.     Left lower leg: No edema.  Lymphadenopathy:     Cervical: No cervical adenopathy.  Skin:    General: Skin is warm and dry.     Coloration: Skin is not jaundiced or pale.     Findings: No bruising, erythema, lesion or rash.  Neurological:     General: No focal deficit present.     Mental Status: She is alert and oriented to person, place, and  time. Mental status is at baseline.     Cranial Nerves: No cranial nerve deficit.     Sensory: No sensory deficit.     Motor: No weakness.     Coordination: Coordination normal.     Gait: Gait normal.     Deep Tendon Reflexes: Reflexes normal.  Psychiatric:        Mood and Affect: Mood normal.        Behavior: Behavior normal.         Thought Content: Thought content normal.        Judgment: Judgment normal.     Diabetic Foot Exam - Simple   Simple Foot Form Diabetic Foot exam was performed with the following findings: Yes 09/23/2023  7:52 AM  Visual Inspection No deformities, no ulcerations, no other skin breakdown bilaterally: Yes Sensation Testing Intact to touch and monofilament testing bilaterally: Yes Pulse Check Posterior Tibialis and Dorsalis pulse intact bilaterally: Yes Comments     No results found for this or any previous visit (from the past 48 hours).   Assessment/plan: MAKYNZIE DOBESH is a 68 y.o. female present close medications CPE and chronic condition management Essential/hypertension/obesity/palpitations/murmur/WCS/Diastolic dysfx- mild/regurg/enlarged aorta Stable Continue lisinopril  to 40 mg daily  Continue amlodipine  2.5 mg daily  - her BP readings fluctuate frequently in response to stress/anxiety,reassured her today, not to worry about BP unless < 100 systolic with symptoms or consistently > 140 systolic.    Type 2 diabetes mellitus w/ manifestations goal < 7 Continue metformin  to 1000 mg qd -She was encouraged to focus on her diet and exercise  PNA series: Pneumonia series completed, due to have Prevnar 20 after 2026 Flu shot: UTD 2024 (recommneded yearly) Foot exam: Completed 09/23/2023 Eye exam: Completed 06/21/2023 - Urine microalbumin-completed 08/30/2023, microalbumin creatinine ratio now normal. A1c: 6.7>> 6.7>> 6.3>> 6.5> 6.6> 7.3> 6.9>6.4>6.9> 7.3> 7.7> 6.7> collected today  Need for Tdap vaccination - Tdap (BOOSTRIX) 5-2.5-18.5 LF-MCG/0.5 injection; Inject 0.5 mLs into the muscle once for 1 dose.  Dispense: 0.5 mL; Refill: 0  Morbid obesity (HCC) - Lipid panel Osteopenia of neck of left femur - DG Bone Density; Future Vitamin D  deficiency - VITAMIN D  25 Hydroxy (Vit-D Deficiency, Fractures) B12 deficiency - Vitamin B12 - continue inj FHx: breast cancer Mam by  gyn-UTD Routine general medical examination at a health care facility (Primary) - CBC - TSH - Comprehensive metabolic panel with GFR Patient was encouraged to exercise greater than 150 minutes a week. Patient was encouraged to choose a diet filled with fresh fruits and vegetables, and lean meats. AVS provided to patient today for education/recommendation on gender specific health and safety maintenance. Colonoscopy: completed 08/27/2014, by Dr. Tova Fresh. follow up 10 yr. Mammogram: completed:07/20/2023 - Dr. Clark-gynecology Immunizations: tdap due today -printed, Influenza UTD (encouraged yearly), PNA series completed - Pna20 due 2026 (56yrp and 63), shingrix declined Infectious disease screening: Hep C completed 2017 DEXA: last completed 08/2021, result osteopenia (-1.6)-ordered today [facility].  Patient declined Fosamax  start   Return in about 24 weeks (around 03/09/2024).   Orders Placed This Encounter  Procedures   DG Bone Density   Hemoglobin A1c   CBC   Lipid panel   TSH   Comprehensive metabolic panel with GFR   Vitamin B12   VITAMIN D  25 Hydroxy (Vit-D Deficiency, Fractures)   Meds ordered this encounter  Medications   Tdap (BOOSTRIX) 5-2.5-18.5 LF-MCG/0.5 injection    Sig: Inject 0.5 mLs into the muscle once for 1 dose.  Dispense:  0.5 mL    Refill:  0   amLODipine  (NORVASC ) 2.5 MG tablet    Sig: Take 1 tablet (2.5 mg total) by mouth daily.    Dispense:  90 tablet    Refill:  1   diclofenac  (VOLTAREN ) 75 MG EC tablet    Sig: Take 1 tablet (75 mg total) by mouth 2 (two) times daily.    Dispense:  180 tablet    Refill:  1   lisinopril  (ZESTRIL ) 40 MG tablet    Sig: Take 1 tablet (40 mg total) by mouth daily.    Dispense:  90 tablet    Refill:  1   metFORMIN  (GLUCOPHAGE ) 1000 MG tablet    Sig: Take 1 tablet (1,000 mg total) by mouth daily with breakfast.    Dispense:  90 tablet    Refill:  1   fluticasone  (FLONASE ) 50 MCG/ACT nasal spray    Sig: Place 2 sprays into  both nostrils in the morning and at bedtime.    Dispense:  16 g    Refill:  11   Referral Orders  No referral(s) requested today     Electronically signed by: Napolean Backbone, DO [facility] [place]

## 2023-09-23 NOTE — Patient Instructions (Addendum)

## 2023-09-24 ENCOUNTER — Ambulatory Visit: Payer: Self-pay | Admitting: Family Medicine

## 2023-09-24 MED ORDER — GLIMEPIRIDE 1 MG PO TABS: ORAL_TABLET | ORAL | 1 refills | Status: DC

## 2023-09-24 NOTE — Telephone Encounter (Signed)
 Please call patient Maria Watkins, liver, kidney and thyroid  functions are normal. Vitamin D  is still low at 22.5, increase vitamin D  to 3000 units daily with food. A1c/diabetes has increased to 8.3, this is becoming uncontrolled diabetes, we must add a medication.  I know she has been resistant to adding a medication in the past, but we cannot wait any longer with this level of A1c. Continue metformin  once daily.  Start glimepiride  1 tablet daily.  This should be taken with her normal largest meal of the day.  If she eats breakfast every morning, she can take with her breakfast.  If she does not eat breakfast then she is to take it with either her lunch or dinner.    Start following a diabetic diet closely-follow-up in 3 months on diabetes

## 2023-09-26 ENCOUNTER — Encounter: Payer: Self-pay | Admitting: Family Medicine

## 2023-09-27 NOTE — Telephone Encounter (Signed)
 I recommend starting the new diabetic med with the metformin .  A1c level is too high this time to postpone adding med to help with control.   If levels improve, we can always consider backing off of med later.

## 2023-09-28 NOTE — Telephone Encounter (Signed)
 No further action needed at this time.

## 2023-09-29 ENCOUNTER — Ambulatory Visit

## 2023-09-29 ENCOUNTER — Ambulatory Visit: Payer: Self-pay

## 2023-09-29 NOTE — Telephone Encounter (Signed)
 Patient called, left VM to return the call to the office to speak to NT.       Copied from CRM [id number]. Topic: Clinical - Medication Question >> Sep 29, 2023  9:59 AM Chuck Crater wrote: Reason for CRM: Patient is feeling kind of strange while taking glimepiride  (AMARYL ) 1 MG tablet and wants speak to nurse. She is having headaches and wants to know if she can bring blood sugar down on her own for 3 months before she start medication. She said in 3 months when she get checked again and it is high she will go on the medicine. **She has gotten rid of sugar in the house and has changed her eating habits.

## 2023-09-29 NOTE — Telephone Encounter (Signed)
 Unable to reach patient after 3 attempts by E2C2 NT, routing to the provider for resolution per protocol.

## 2023-09-29 NOTE — Telephone Encounter (Signed)
 Spoke with pt. Pt requested appt to discuss with provider. Appt scheduled.

## 2023-09-29 NOTE — Telephone Encounter (Signed)
 Patient called, left VM to return the call to the office to speak to NT.

## 2023-09-29 NOTE — Telephone Encounter (Signed)
 Please review message

## 2023-09-29 NOTE — Telephone Encounter (Signed)
 LVM to discuss with pt. Sent MyChart.

## 2023-10-01 ENCOUNTER — Ambulatory Visit (INDEPENDENT_AMBULATORY_CARE_PROVIDER_SITE_OTHER): Admitting: Family Medicine

## 2023-10-01 ENCOUNTER — Encounter: Payer: Self-pay | Admitting: Family Medicine

## 2023-10-01 ENCOUNTER — Ambulatory Visit

## 2023-10-01 VITALS — BP 138/82 | HR 68 | Temp 98.1°F | Wt 171.0 lb

## 2023-10-01 DIAGNOSIS — E1169 Type 2 diabetes mellitus with other specified complication: Secondary | ICD-10-CM

## 2023-10-01 DIAGNOSIS — E785 Hyperlipidemia, unspecified: Secondary | ICD-10-CM | POA: Diagnosis not present

## 2023-10-01 DIAGNOSIS — E538 Deficiency of other specified B group vitamins: Secondary | ICD-10-CM | POA: Diagnosis not present

## 2023-10-01 DIAGNOSIS — Z7984 Long term (current) use of oral hypoglycemic drugs: Secondary | ICD-10-CM | POA: Diagnosis not present

## 2023-10-01 MED ORDER — CYANOCOBALAMIN 1000 MCG/ML IJ SOLN
1000.0000 ug | Freq: Once | INTRAMUSCULAR | Status: AC
  Administered 2023-10-01: 1000 ug via INTRAMUSCULAR

## 2023-10-01 NOTE — Progress Notes (Signed)
 Maria Watkins , May 07, 1955, 68 y.o., female MRN: [id number] Patient Care Team    Relationship Specialty Notifications Start End  Mariel Shope, DO PCP - General Family Medicine  12/26/21   Tami Falcon, MD Consulting Physician Gastroenterology  01/18/18   Luan Rumpf, MD Consulting Physician Obstetrics  01/18/18   Ranny Bye, OD Referring Physician   04/09/21   Wendolyn Hamburger, MD Consulting Physician Orthopedic Surgery  06/10/23   Cindee Crazier, MD Attending Physician Physical Medicine and Rehabilitation  06/10/23    Comment: lumbar epi inj    Chief Complaint  Patient presents with   Medication Management    Pt wants to discuss the medication glimepiride  (AMARYL ) 1 MG tablet.      Subjective: Maria Watkins is a 68 y.o. Pt presents for an OV to discuss the recent addition to her DM regimen d/t increased A1c from 6.7 to 8.3. She is also present for B12 injection for b12 deficiency today  She is worried about using the amaryl  and metformin  together.       09/23/2023    7:42 AM 06/10/2023   10:17 AM 01/14/2023    8:30 AM 01/06/2023    3:34 PM 09/09/2022   10:03 AM  Depression screen PHQ 2/9  Decreased Interest 1 1 1  0 0  Down, Depressed, Hopeless 0 1 1 0 0  PHQ - 2 Score 1 2 2  0 0  Altered sleeping 1 1 1     Tired, decreased energy 1 1 1     Change in appetite 0 0 0    Feeling bad or failure about yourself  1 0 0    Trouble concentrating 0 0 0    Moving slowly or fidgety/restless 0 1 0    Suicidal thoughts 0 0 0    PHQ-9 Score 4 5 4     Difficult doing work/chores Somewhat difficult Somewhat difficult Not difficult at all      Allergies  Allergen Reactions   Terconazole Nausea Only   Fosamax  [Alendronate ] Diarrhea and Nausea Only   Social History   Social History Narrative   Not on file   Past Medical History:  Diagnosis Date   Abnormal Pap smear of cervix 1980's   resolved post cryo with normal follow up   Depression    Elevated alkaline phosphatase level  05/15/2019   Estrogen deficiency 05/15/2019   Gallstones 08/04/2012   GERD (gastroesophageal reflux disease) 11/09/2019   Hypertension    Lymphadenopathy 08/02/2019   Type 2 diabetes mellitus without complication, without long-term current use of insulin (HCC) 01/18/2018   Past Surgical History:  Procedure Laterality Date   CERVIX LESION DESTRUCTION  1980s   CHOLECYSTECTOMY N/A 01/13/2021   Procedure: LAPAROSCOPIC CHOLECYSTECTOMY WITH INTRAOPERATIVE CHOLANGIOGRAM;  Surgeon: Caralyn Chandler, MD;  Location: [facility] OR;  Service: General;  Laterality: N/A;   COLPOSCOPY  1980's   LUMBAR EPIDURAL INJECTION     L4-L5, May end up also with L2-3 in the future- Dr. Donna Fus   Family History  Problem Relation Age of Onset   Diabetes Mother    Arthritis Mother    COPD Father    Asthma Father    Heart disease Father    Diabetes Sister    Asthma Brother    COPD Sister    Rheum arthritis Sister    Pulmonary fibrosis Sister        Passed away 2020-04-09  Breast cancer Maternal Aunt  Alcohol abuse Son    Allergies as of 10/01/2023       Reactions   Terconazole Nausea Only   Fosamax  [alendronate ] Diarrhea, Nausea Only        Medication List        Accurate as of October 01, 2023  8:28 AM. If you have any questions, ask your nurse or doctor.          amLODipine  2.5 MG tablet Commonly known as: NORVASC  Take 1 tablet (2.5 mg total) by mouth daily.   cholecalciferol 25 MCG (1000 UNIT) tablet Commonly known as: VITAMIN D3 Take 2,000 Units by mouth daily.   diclofenac  75 MG EC tablet Commonly known as: VOLTAREN  Take 1 tablet (75 mg total) by mouth 2 (two) times daily.   fluticasone  50 MCG/ACT nasal spray Commonly known as: FLONASE  Place 2 sprays into both nostrils in the morning and at bedtime.   glimepiride  1 MG tablet Commonly known as: AMARYL  1 tab p.o. daily. with largest meal of the day.   lisinopril  40 MG tablet Commonly known as: ZESTRIL  Take 1 tablet (40 mg total) by  mouth daily.   metFORMIN  1000 MG tablet Commonly known as: GLUCOPHAGE  Take 1 tablet (1,000 mg total) by mouth daily with breakfast.   tiZANidine  4 MG tablet Commonly known as: Zanaflex  Take 0.5-1 tablets (2-4 mg total) by mouth every 8 (eight) hours as needed for muscle spasms.        All past medical history, surgical history, allergies, family history, immunizations andmedications were updated in the EMR today and reviewed under the history and medication portions of their EMR.     ROS Negative, with the exception of above mentioned in HPI   Objective:  BP 138/82   Pulse 68   Temp 98.1 F (36.7 C)   Wt 171 lb (77.6 kg)   LMP 04/27/2005 (Approximate)   SpO2 98%   BMI 31.28 kg/m  Body mass index is 31.28 kg/m. Physical Exam Vitals and nursing note reviewed.  Constitutional:      General: She is not in acute distress.    Appearance: Normal appearance. She is normal weight. She is not ill-appearing or toxic-appearing.  HENT:     Head: Normocephalic and atraumatic.  Eyes:     General: No scleral icterus.       Right eye: No discharge.        Left eye: No discharge.     Extraocular Movements: Extraocular movements intact.     Conjunctiva/sclera: Conjunctivae normal.     Pupils: Pupils are equal, round, and reactive to light.  Skin:    Findings: No rash.  Neurological:     Mental Status: She is alert and oriented to person, place, and time. Mental status is at baseline.     Motor: No weakness.     Coordination: Coordination normal.     Gait: Gait normal.  Psychiatric:        Mood and Affect: Mood normal.        Behavior: Behavior normal.        Thought Content: Thought content normal.        Judgment: Judgment normal.    No results found. No results found. No results found for this or any previous visit (from the past 24 hours).  Assessment/Plan: Maria Watkins is a 68 y.o. female present for OV for  B12 deficiency (Primary) - cyanocobalamin  (VITAMIN  B12) injection 1,000 mcg  Type 2 diabetes mellitus with hyperlipidemia (HCC) Discussed  her concerns. All questions were answered  Discussed low glycemic diet in detail.  Routine exercise recommended.  Tolerating amaryl  1 mg with breakfast and metformin  1000 mg with dinner  Next appt scheduled in August.   Reviewed expectations re: course of current medical issues. Discussed self-management of symptoms. Outlined signs and symptoms indicating need for more acute intervention. Patient verbalized understanding and all questions were answered. Patient received an After-Visit Summary.    No orders of the defined types were placed in this encounter.  Meds ordered this encounter  Medications   cyanocobalamin  (VITAMIN B12) injection 1,000 mcg   Referral Orders  No referral(s) requested today     Note is dictated utilizing voice recognition software. Although note has been proof read prior to signing, occasional typographical errors still can be missed. If any questions arise, please do not hesitate to call for verification.   electronically signed by:  Napolean Backbone, DO  [facility] - OR

## 2023-10-01 NOTE — Patient Instructions (Addendum)
 Low glycemic diet is recommended.            Great to see you today.  I have refilled the medication(s) we provide.   If labs were collected or images ordered, we will inform you of  results once we have received them and reviewed. We will contact you either by echart message, or telephone call.  Please give ample time to the testing facility, and our office to run,  receive and review results. Please do not call inquiring of results, even if you can see them in your chart. We will contact you as soon as we are able. If it has been over 1 week since the test was completed, and you have not yet heard from us , then please call us .    - echart message- for normal results that have been seen by the patient already.   - telephone call: abnormal results or if patient has not viewed results in their echart.  If a referral to a specialist was entered for you, please call us  in 2 weeks if you have not heard from the specialist office to schedule.

## 2023-10-04 ENCOUNTER — Ambulatory Visit (HOSPITAL_BASED_OUTPATIENT_CLINIC_OR_DEPARTMENT_OTHER)
Admission: RE | Admit: 2023-10-04 | Discharge: 2023-10-04 | Disposition: A | Discharge status: Home / Self Care | Source: Ambulatory Visit | Attending: Family Medicine | Admitting: Family Medicine

## 2023-10-04 DIAGNOSIS — M85852 Other specified disorders of bone density and structure, left thigh: Secondary | ICD-10-CM | POA: Diagnosis not present

## 2023-10-04 DIAGNOSIS — M85851 Other specified disorders of bone density and structure, right thigh: Secondary | ICD-10-CM | POA: Diagnosis not present

## 2023-10-04 DIAGNOSIS — Z780 Asymptomatic menopausal state: Secondary | ICD-10-CM | POA: Diagnosis not present

## 2023-10-07 DIAGNOSIS — M5416 Radiculopathy, lumbar region: Secondary | ICD-10-CM | POA: Diagnosis not present

## 2023-10-08 ENCOUNTER — Telehealth: Payer: Self-pay

## 2023-10-08 DIAGNOSIS — E1169 Type 2 diabetes mellitus with other specified complication: Secondary | ICD-10-CM

## 2023-10-08 NOTE — Telephone Encounter (Signed)
 Those machines still require a drop of blood, meaning she has to stick her finger.  It is not the A1c she needs to monitor, it is her fluctuation in glucose and that is either by a finger stick type of monitor or one that attaches to arm temporarily and she uses her smart phone to read.

## 2023-10-08 NOTE — Telephone Encounter (Signed)
 Communication  Reason for CRM: Patient is calling in stating she saw that they make machines to check just her A1C, patient is asking if the doctor thinks this would be a good idea. She does not want the thing you put under your skin or poke your finger with the meter, she wants a super simply one she found. Patient would just like to run this by the doctor. A good call back number is [phone].    Called and spoke with pt. Pt requesting script for CGM. I advised pt that if an appointment was needed with her provider we would let her know. LOV: 6/6. Please advise.

## 2023-10-11 ENCOUNTER — Encounter: Payer: Self-pay | Admitting: Family Medicine

## 2023-10-12 NOTE — Telephone Encounter (Signed)
 Ok to call in CGM and supplies for her please. Check glucose bid for dx code diabetes

## 2023-10-13 MED ORDER — BLOOD GLUCOSE TEST VI STRP: ORAL_STRIP | 0 refills | Status: DC

## 2023-10-13 MED ORDER — LANCETS MISC. MISC: 0 refills | Status: AC

## 2023-10-13 MED ORDER — LANCET DEVICE MISC: 0 refills | Status: DC

## 2023-10-13 MED ORDER — BLOOD GLUCOSE MONITORING SUPPL DEVI: 0 refills | Status: AC

## 2023-10-13 NOTE — Addendum Note (Signed)
 Addended by: Margaretta Shaw A on: 10/13/2023 09:58 AM   Modules accepted: Orders

## 2023-10-13 NOTE — Telephone Encounter (Signed)
Monitor and supplies sent

## 2023-10-20 ENCOUNTER — Ambulatory Visit

## 2023-10-20 DIAGNOSIS — E1169 Type 2 diabetes mellitus with other specified complication: Secondary | ICD-10-CM

## 2023-10-20 DIAGNOSIS — Z7984 Long term (current) use of oral hypoglycemic drugs: Secondary | ICD-10-CM | POA: Diagnosis not present

## 2023-10-20 NOTE — Progress Notes (Signed)
 Pt here today for education on how to use glucose monitor. Pt instructed how to use and demonstrated instructions back to nurse.

## 2023-10-21 ENCOUNTER — Encounter: Payer: Self-pay | Admitting: Family Medicine

## 2023-10-27 ENCOUNTER — Ambulatory Visit: Admitting: Cardiovascular Disease

## 2023-11-01 ENCOUNTER — Encounter: Payer: Self-pay | Admitting: Cardiovascular Disease

## 2023-11-01 ENCOUNTER — Ambulatory Visit: Attending: Cardiovascular Disease | Admitting: Cardiovascular Disease

## 2023-11-01 VITALS — BP 148/81 | HR 65 | Ht 62.0 in | Wt 172.0 lb

## 2023-11-01 DIAGNOSIS — R002 Palpitations: Secondary | ICD-10-CM | POA: Diagnosis not present

## 2023-11-01 DIAGNOSIS — I10 Essential (primary) hypertension: Secondary | ICD-10-CM

## 2023-11-01 DIAGNOSIS — I071 Rheumatic tricuspid insufficiency: Secondary | ICD-10-CM | POA: Diagnosis not present

## 2023-11-01 DIAGNOSIS — G4733 Obstructive sleep apnea (adult) (pediatric): Secondary | ICD-10-CM | POA: Insufficient documentation

## 2023-11-01 NOTE — Progress Notes (Unsigned)
 11/01/2023 Skylar Flynt Chihuahua   [birth year]  [id number]  Primary Physician Catherine Charlies LABOR, DO Primary Cardiologist: Dorn JINNY Lesches MD [facility] [place], [place], FSCAI  HPI:  Maria Watkins is a 68 y.o. moderately overweight married Caucasian female mother of 1 child with no grandchildren referred by Dr. Catherine, her PCP, because of cardiac murmur. 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  I last saw her in the office 11/01/2020.  She smoked remotely. She has a history of treated hypertension and diabetes. Her father had MI in his 79s. She is never had a heart attack or stroke. She denies chest pain or shortness of breath. Her PCP apparently heard a murmur and obtain an echo 09/04/2020 that showed normal LV systolic function with mild MR and moderate TR.  Since I saw her 3 years ago she has noted some increasing palpitations.  She denies chest pain or shortness of breath.  She did wear a Zio patch for 2 weeks 02/01/2023 at which revealed several runs of SVT and NSVT.  She does feel palpitations.  She admits to using caffeine.  She may have sleep apnea as well.   Current Meds  Medication Sig   amLODipine  (NORVASC ) 2.5 MG tablet Take 1 tablet (2.5 mg total) by mouth daily.   Blood Glucose Monitoring Suppl DEVI Check glucose twice daily.   cholecalciferol (VITAMIN D3) 25 MCG (1000 UT) tablet Take 2,000 Units by mouth daily.   diclofenac  (VOLTAREN ) 75 MG EC tablet Take 1 tablet (75 mg total) by mouth 2 (two) times daily.   fluticasone  (FLONASE ) 50 MCG/ACT nasal spray Place 2 sprays into both nostrils in the morning and at bedtime.   glimepiride  (AMARYL ) 1 MG tablet 1 tab p.o. daily. with largest meal of the day.   Glucose Blood (BLOOD GLUCOSE TEST STRIPS) STRP Check glucose twice a day.   Lancet Device MISC Check glucose twice daily.   Lancets Misc. MISC Check glucose twice daily.   lisinopril   (ZESTRIL ) 40 MG tablet Take 1 tablet (40 mg total) by mouth daily.   metFORMIN  (GLUCOPHAGE ) 1000 MG tablet Take 1 tablet (1,000 mg total) by mouth daily with breakfast.   Current Facility-Administered Medications for the 11/01/23 encounter (Office Visit) with Lesches Dorn JINNY, MD  Medication   cyanocobalamin  (VITAMIN B12) injection 1,000 mcg     Allergies  Allergen Reactions   Terconazole Nausea Only   Fosamax  [Alendronate ] Diarrhea and Nausea Only    Social History   Socioeconomic History   Marital status: Married    Spouse name: Not on file   Number of children: Not on file   Years of education: Not on file   Highest education level: Not on file  Occupational History   Occupation: retired  Tobacco Use   Smoking status: Former    Current packs/day: 0.00    Types: Cigarettes    Quit date: 04/28/1991    Years since quitting: 32.5   Smokeless tobacco: Never  Vaping Use   Vaping status: Never Used  Substance and Sexual Activity   Alcohol use: No   Drug use: No   Sexual activity: Never    Birth control/protection: Post-menopausal  Other Topics Concern   Not on file  Social History Narrative   Not on file   Social Drivers of Health   Financial Resource Strain: Low Risk  (01/06/2023)   Overall Financial Resource  Strain (CARDIA)    Difficulty of Paying Living Expenses: Not hard at all  Food Insecurity: No Food Insecurity (01/06/2023)   Hunger Vital Sign    Worried About Running Out of Food in the Last Year: Never true    Ran Out of Food in the Last Year: Never true  Transportation Needs: No Transportation Needs (01/06/2023)   PRAPARE - Administrator, Civil Service (Medical): No    Lack of Transportation (Non-Medical): No  Physical Activity: Insufficiently Active (01/06/2023)   Exercise Vital Sign    Days of Exercise per Week: 7 days    Minutes of Exercise per Session: 20 min  Stress: No Stress Concern Present (01/06/2023)   [employer] of Occupational  Health - Occupational Stress Questionnaire    Feeling of Stress : Only a little  Social Connections: Moderately Integrated (01/06/2023)   Social Connection and Isolation Panel    Frequency of Communication with Friends and Family: More than three times a week    Frequency of Social Gatherings with Friends and Family: Once a week    Attends Religious Services: More than 4 times per year    Active Member of [employer] or Organizations: No    Attends Banker Meetings: Never    Marital Status: Married  Catering manager Violence: Not At Risk (01/06/2023)   Humiliation, Afraid, Rape, and Kick questionnaire    Fear of Current or Ex-Partner: No    Emotionally Abused: No    Physically Abused: No    Sexually Abused: No     Review of Systems: General: negative for chills, fever, night sweats or weight changes.  Cardiovascular: negative for chest pain, dyspnea on exertion, edema, orthopnea, palpitations, paroxysmal nocturnal dyspnea or shortness of breath Dermatological: negative for rash Respiratory: negative for cough or wheezing Urologic: negative for hematuria Abdominal: negative for nausea, vomiting, diarrhea, bright red blood per rectum, melena, or hematemesis Neurologic: negative for visual changes, syncope, or dizziness All other systems reviewed and are otherwise negative except as noted above.    Blood pressure (!) 148/81, pulse 65, height 5' 2 (1.575 m), weight 172 lb (78 kg), last menstrual period 04/27/2005, SpO2 99%.  General appearance: alert and no distress Neck: no adenopathy, no carotid bruit, no JVD, supple, symmetrical, trachea midline, and thyroid  not enlarged, symmetric, no tenderness/mass/nodules Lungs: clear to auscultation bilaterally Heart: regular rate and rhythm, S1, S2 normal, no murmur, click, rub or gallop Extremities: extremities normal, atraumatic, no cyanosis or edema Pulses: 2+ and symmetric Skin: Skin color, texture, turgor normal. No rashes or  lesions Neurologic: Grossly normal  EKG EKG Interpretation Date/Time:  Monday November 01 2023 10:56:18 EDT Ventricular Rate:  65 PR Interval:  144 QRS Duration:  78 QT Interval:  416 QTC Calculation: 432 R Axis:   -46  Text Interpretation: Normal sinus rhythm Left anterior fascicular block Possible Anterolateral infarct (cited on or before 24-Oct-2007) When compared with ECG of 24-Oct-2007 08:54, QRS axis Shifted left Confirmed by Court Carrier [id number] on 11/01/2023 11:00:35 AM    ASSESSMENT AND PLAN:   Essential hypertension History of essential hypertension on palpation pressure today 148/81.  She she checks her blood pressure at home and says it usually in the 140/80 range.  She is on lisinopril  and low-dose amlodipine .  Palpitations History of palpitations that occur randomly with a 2-week Zio patch performed 02/01/2023 revealing an average heart rate of 73 with 12 runs of SVT and 8 runs of nonsustained wide-complex tachycardia with no evidence  of A-fib.  She does drink caffeine and may have sleep apnea.  I told her to avoid caffeine.  Will refer her to pulmonary to evaluate her for sleep apnea.  I am not going to start a beta-blocker at this time.  I will see her back in 6 months for follow-up.  Moderate tricuspid valve regurgitation Moderate TR on 2D echo performed 09/04/2020 with mild elevation in RV systolic pressure and mild MR.  Will repeat 2D echocardiogram.  Obstructive sleep apnea Patient does have nocturnal snoring and daytime somnolence with a body habitus consistent with sleep sleep apnea.  I am referring her to pulmonary to further evaluate.  This may be contributing to her palpitations as well.     Dorn DOROTHA Lesches MD FACP,FACC,FAHA, [facility] 11/01/2023 11:14 AM

## 2023-11-01 NOTE — Assessment & Plan Note (Signed)
 Moderate TR on 2D echo performed 09/04/2020 with mild elevation in RV systolic pressure and mild MR.  Will repeat 2D echocardiogram.

## 2023-11-01 NOTE — Patient Instructions (Addendum)
 Medication Instructions:  Your physician recommends that you continue on your current medications as directed. Please refer to the Current Medication list given to you today.  *If you need a refill on your cardiac medications before your next appointment, please call your pharmacy*  Testing/Procedures: Your physician has requested that you have an echocardiogram. Echocardiography is a painless test that uses sound waves to create images of your heart. It provides your doctor with information about the size and shape of your heart and how well your heart's chambers and valves are working. This procedure takes approximately one hour. There are no restrictions for this procedure. Please do NOT wear cologne, perfume, aftershave, or lotions (deodorant is allowed). Please arrive 15 minutes prior to your appointment time.  Please note: We ask at that you not bring children with you during ultrasound (echo/ vascular) testing. Due to room size and safety concerns, children are not allowed in the ultrasound rooms during exams. Our front office staff cannot provide observation of children in our lobby area while testing is being conducted. An adult accompanying a patient to their appointment will only be allowed in the ultrasound room at the discretion of the ultrasound technician under special circumstances. We apologize for any inconvenience.   Dr. Court has ordered a CT coronary calcium score.   Test locations:  [facility]  [facility] at [facility]  This is $99 out of pocket.  Please call to schedule: [phone]  Coronary CalciumScan A coronary calcium scan is an imaging test used to look for deposits of calcium and other fatty materials (plaques) in the inner lining of the blood vessels of the heart (coronary arteries). These deposits of calcium and plaques can partly clog and narrow the coronary arteries without  producing any symptoms or warning signs. This puts a person at risk for a heart attack. This test can detect these deposits before symptoms develop. Tell a health care provider about: Any allergies you have. All medicines you are taking, including vitamins, herbs, eye drops, creams, and over-the-counter medicines. Any problems you or family members have had with anesthetic medicines. Any blood disorders you have. Any surgeries you have had. Any medical conditions you have. Whether you are pregnant or may be pregnant. What are the risks? Generally, this is a safe procedure. However, problems may occur, including: Harm to a pregnant woman and her unborn baby. This test involves the use of radiation. Radiation exposure can be dangerous to a pregnant woman and her unborn baby. If you are pregnant, you generally should not have this procedure done. Slight increase in the risk of cancer. This is because of the radiation involved in the test. What happens before the procedure? No preparation is needed for this procedure. What happens during the procedure? You will undress and remove any jewelry around your neck or chest. You will put on a hospital gown. Sticky electrodes will be placed on your chest. The electrodes will be connected to an electrocardiogram (ECG) machine to record a tracing of the electrical activity of your heart. A CT scanner will take pictures of your heart. During this time, you will be asked to lie still and hold your breath for 2-3 seconds while a picture of your heart is being taken. The procedure may vary among health care providers and hospitals. What happens after the procedure? You can get dressed. You can return to your normal activities. It is up to you to get the  results of your test. Ask your health care provider, or the department that is doing the test, when your results will be ready. Summary A coronary calcium scan is an imaging test used to look for deposits of  calcium and other fatty materials (plaques) in the inner lining of the blood vessels of the heart (coronary arteries). Generally, this is a safe procedure. Tell your health care provider if you are pregnant or may be pregnant. No preparation is needed for this procedure. A CT scanner will take pictures of your heart. You can return to your normal activities after the scan is done. This information is not intended to replace advice given to you by your health care provider. Make sure you discuss any questions you have with your health care provider. Document Released: 10/10/2007 Document Revised: 03/02/2016 Document Reviewed: 03/02/2016 Elsevier Interactive Patient Education  2017 [employer].   Follow-Up: At [facility], you and your health needs are our priority.  As part of our continuing mission to provide you with exceptional heart care, our providers are all part of one team.  This team includes your primary Cardiologist (physician) and Advanced Practice Providers or APPs (Physician Assistants and Nurse Practitioners) who all work together to provide you with the care you need, when you need it.  Your next appointment:   In December  Provider:   Dorn Lesches, MD    We recommend signing up for the patient portal called MyChart.  Sign up information is provided on this After Visit Summary.  MyChart is used to connect with patients for Virtual Visits (Telemedicine).  Patients are able to view lab/test results, encounter notes, upcoming appointments, etc.  Non-urgent messages can be sent to your provider as well.   To learn more about what you can do with MyChart, go to [email or web address].

## 2023-11-01 NOTE — Assessment & Plan Note (Signed)
 History of essential hypertension on palpation pressure today 148/81.  She she checks her blood pressure at home and says it usually in the 140/80 range.  She is on lisinopril  and low-dose amlodipine .

## 2023-11-01 NOTE — Assessment & Plan Note (Signed)
 Patient does have nocturnal snoring and daytime somnolence with a body habitus consistent with sleep sleep apnea.  I am referring her to pulmonary to further evaluate.  This may be contributing to her palpitations as well.

## 2023-11-01 NOTE — Assessment & Plan Note (Signed)
 History of palpitations that occur randomly with a 2-week Zio patch performed 02/01/2023 revealing an average heart rate of 73 with 12 runs of SVT and 8 runs of nonsustained wide-complex tachycardia with no evidence of A-fib.  She does drink caffeine and may have sleep apnea.  I told her to avoid caffeine.  Will refer her to pulmonary to evaluate her for sleep apnea.  I am not going to start a beta-blocker at this time.  I will see her back in 6 months for follow-up.

## 2023-11-03 ENCOUNTER — Ambulatory Visit

## 2023-11-05 ENCOUNTER — Ambulatory Visit (INDEPENDENT_AMBULATORY_CARE_PROVIDER_SITE_OTHER)

## 2023-11-05 DIAGNOSIS — E538 Deficiency of other specified B group vitamins: Secondary | ICD-10-CM

## 2023-11-05 NOTE — Progress Notes (Signed)
 Pt here for monthly B12 injection per original order dated:   Last B12 injection:10/01/23  Last B12 level:  09/23/23  B12 1000mcg given IM, and pt tolerated injection well.  Next B12 injection scheduled for: 1 month   Please sign in absence of PCP

## 2023-11-23 ENCOUNTER — Other Ambulatory Visit (HOSPITAL_COMMUNITY)

## 2023-12-01 ENCOUNTER — Telehealth: Payer: Self-pay

## 2023-12-01 NOTE — Telephone Encounter (Signed)
 Patient dropped off document [employer] letter, to be filled out by provider. Patient requested to send it back via Mail within 5-days. Document is located in providers tray at front office. Pt would like a letter if possible mailed. Please advise at [facility] [phone]

## 2023-12-01 NOTE — Telephone Encounter (Signed)
Placed in PCP office for review

## 2023-12-02 ENCOUNTER — Other Ambulatory Visit (HOSPITAL_BASED_OUTPATIENT_CLINIC_OR_DEPARTMENT_OTHER)

## 2023-12-03 NOTE — Telephone Encounter (Signed)
Document printed.

## 2023-12-03 NOTE — Telephone Encounter (Signed)
 Put upfront for mail out.

## 2023-12-07 ENCOUNTER — Ambulatory Visit (INDEPENDENT_AMBULATORY_CARE_PROVIDER_SITE_OTHER): Admitting: Family Medicine

## 2023-12-07 ENCOUNTER — Encounter: Payer: Self-pay | Admitting: Family Medicine

## 2023-12-07 VITALS — BP 132/80 | HR 66 | Temp 98.2°F | Wt 174.8 lb

## 2023-12-07 DIAGNOSIS — M85852 Other specified disorders of bone density and structure, left thigh: Secondary | ICD-10-CM | POA: Diagnosis not present

## 2023-12-07 DIAGNOSIS — I7789 Other specified disorders of arteries and arterioles: Secondary | ICD-10-CM | POA: Diagnosis not present

## 2023-12-07 DIAGNOSIS — E559 Vitamin D deficiency, unspecified: Secondary | ICD-10-CM | POA: Diagnosis not present

## 2023-12-07 DIAGNOSIS — E785 Hyperlipidemia, unspecified: Secondary | ICD-10-CM

## 2023-12-07 DIAGNOSIS — E1169 Type 2 diabetes mellitus with other specified complication: Secondary | ICD-10-CM

## 2023-12-07 DIAGNOSIS — Z7984 Long term (current) use of oral hypoglycemic drugs: Secondary | ICD-10-CM

## 2023-12-07 DIAGNOSIS — E538 Deficiency of other specified B group vitamins: Secondary | ICD-10-CM | POA: Diagnosis not present

## 2023-12-07 DIAGNOSIS — I10 Essential (primary) hypertension: Secondary | ICD-10-CM | POA: Diagnosis not present

## 2023-12-07 LAB — POCT GLYCOSYLATED HEMOGLOBIN (HGB A1C)
HbA1c POC (<> result, manual entry): 7.1 % (ref 4.0–5.6)
HbA1c, POC (controlled diabetic range): 7.1 % — AB (ref 0.0–7.0)
HbA1c, POC (prediabetic range): 7.1 % — AB (ref 5.7–6.4)
Hemoglobin A1C: 7.1 % — AB (ref 4.0–5.6)

## 2023-12-07 MED ORDER — LISINOPRIL 40 MG PO TABS: 40.0000 mg | ORAL_TABLET | Freq: Every day | ORAL | 1 refills | Status: DC

## 2023-12-07 MED ORDER — DICLOFENAC SODIUM 75 MG PO TBEC: 75.0000 mg | DELAYED_RELEASE_TABLET | Freq: Two times a day (BID) | ORAL | 1 refills | Status: DC

## 2023-12-07 MED ORDER — GLIMEPIRIDE 1 MG PO TABS: ORAL_TABLET | ORAL | 1 refills | Status: DC

## 2023-12-07 MED ORDER — METFORMIN HCL 1000 MG PO TABS: 1000.0000 mg | ORAL_TABLET | Freq: Every day | ORAL | 1 refills | Status: DC

## 2023-12-07 MED ORDER — AMLODIPINE BESYLATE 2.5 MG PO TABS: 2.5000 mg | ORAL_TABLET | Freq: Every day | ORAL | 1 refills | Status: DC

## 2023-12-07 MED ORDER — CYANOCOBALAMIN 1000 MCG/ML IJ SOLN
1000.0000 ug | Freq: Once | INTRAMUSCULAR | Status: AC
  Administered 2023-12-07 (×2): 1000 ug via INTRAMUSCULAR

## 2023-12-07 NOTE — Patient Instructions (Addendum)
 Return in about 16 weeks (around 03/28/2024) for Routine chronic condition follow-up.        Great to see you today.  I have refilled the medication(s) we provide.   If labs were collected or images ordered, we will inform you of  results once we have received them and reviewed. We will contact you either by echart message, or telephone call.  Please give ample time to the testing facility, and our office to run,  receive and review results. Please do not call inquiring of results, even if you can see them in your chart. We will contact you as soon as we are able. If it has been over 1 week since the test was completed, and you have not yet heard from us , then please call us .    - echart message- for normal results that have been seen by the patient already.   - telephone call: abnormal results or if patient has not viewed results in their echart.  If a referral to a specialist was entered for you, please call us  in 2 weeks if you have not heard from the specialist office to schedule.

## 2023-12-07 NOTE — Progress Notes (Signed)
 Maria Watkins , February 23, 1956, 68 y.o., female MRN: [id number] Patient Care Team    Relationship Specialty Notifications Start End  Catherine Charlies LABOR, DO PCP - General Family Medicine  12/26/21   Kristie Lamprey, MD Consulting Physician Gastroenterology  01/18/18   Gretta Gums, MD Consulting Physician Obstetrics  01/18/18   Elspeth Lauraine DEL, OD Referring Physician   04/09/21   Liam Lerner, MD Consulting Physician Orthopedic Surgery  06/10/23   Cesario Boer, MD Attending Physician Physical Medicine and Rehabilitation  06/10/23    Comment: lumbar epi inj    Chief Complaint  Patient presents with   Diabetes    Pt is not fasting.    Hypertension     Subjective: Maria Watkins is a 68 y.o. Pt presents for a. Chronic Conditions/illness Management  Essential hypertension/morbid obesity/palpitations/HLD/valve regurg/enlarged aorta Pt reports compliance with lisinopril  40 mg daily and amlodipine  2.5  mg daily.  Patient denies chest pain, shortness of breath, dizziness or lower extremity edema.  Diet: Regular Exercise: Routine exercise RF: Hypertension, diabetes, obesity, former smoker, family history of heart disease   diabetes: Patient reports compliance with metformin  1000 mg qd.  Reports Farxiga  caused her dizziness.  She did not start the amaryl . Patient denies dizziness, hyperglycemic or hypoglycemic events. Patient denies numbness, tingling in the extremities or nonhealing wounds of feet.       12/07/2023   10:02 AM 09/23/2023    7:42 AM 06/10/2023   10:17 AM 01/14/2023    8:30 AM 01/06/2023    3:34 PM  Depression screen PHQ 2/9  Decreased Interest 1 1 1 1  0  Down, Depressed, Hopeless 1 0 1 1 0  PHQ - 2 Score 2 1 2 2  0  Altered sleeping 1 1 1 1    Tired, decreased energy 1 1 1 1    Change in appetite 0 0 0 0   Feeling bad or failure about yourself  1 1 0 0   Trouble concentrating 1 0 0 0   Moving slowly or fidgety/restless 1 0 1 0   Suicidal thoughts 0 0 0 0   PHQ-9 Score 7 4  5 4    Difficult doing work/chores Somewhat difficult Somewhat difficult Somewhat difficult Not difficult at all     Allergies  Allergen Reactions   Terconazole Nausea Only   Fosamax  [Alendronate ] Diarrhea and Nausea Only   Social History   Social History Narrative   Not on file   Past Medical History:  Diagnosis Date   Abnormal Pap smear of cervix 1980's   resolved post cryo with normal follow up   Depression    Elevated alkaline phosphatase level 05/15/2019   Estrogen deficiency 05/15/2019   Gallstones 08/04/2012   GERD (gastroesophageal reflux disease) 11/09/2019   Hypertension    Lymphadenopathy 08/02/2019   Type 2 diabetes mellitus without complication, without long-term current use of insulin (HCC) 01/18/2018   Past Surgical History:  Procedure Laterality Date   CERVIX LESION DESTRUCTION  1980s   CHOLECYSTECTOMY N/A 01/13/2021   Procedure: LAPAROSCOPIC CHOLECYSTECTOMY WITH INTRAOPERATIVE CHOLANGIOGRAM;  Surgeon: Curvin Deward MOULD, MD;  Location: [facility] OR;  Service: General;  Laterality: N/A;   COLPOSCOPY  1980's   LUMBAR EPIDURAL INJECTION     L4-L5, May end up also with L2-3 in the future- Dr. Cesario   Family History  Problem Relation Age of Onset   Diabetes Mother    Arthritis Mother    COPD Father    Asthma Father  Heart disease Father    Diabetes Sister    Asthma Brother    COPD Sister    Rheum arthritis Sister    Pulmonary fibrosis Sister        Passed away 2020/05/16  Breast cancer Maternal Aunt    Alcohol abuse Son    Allergies as of 12/07/2023       Reactions   Terconazole Nausea Only   Fosamax  [alendronate ] Diarrhea, Nausea Only        Medication List        Accurate as of December 07, 2023 10:27 AM. If you have any questions, ask your nurse or doctor.          amLODipine  2.5 MG tablet Commonly known as: NORVASC  Take 1 tablet (2.5 mg total) by mouth daily.   Blood Glucose Monitoring Suppl Devi Check glucose twice daily.   OneTouch  Verio Reflect w/Device Kit CHECK GLUCOSE TWICE DAILY   BLOOD GLUCOSE TEST STRIPS Strp Check glucose twice a day.   cholecalciferol 25 MCG (1000 UNIT) tablet Commonly known as: VITAMIN D3 Take 2,000 Units by mouth daily.   diclofenac  75 MG EC tablet Commonly known as: VOLTAREN  Take 1 tablet (75 mg total) by mouth 2 (two) times daily.   fluticasone  50 MCG/ACT nasal spray Commonly known as: FLONASE  Place 2 sprays into both nostrils in the morning and at bedtime.   glimepiride  1 MG tablet Commonly known as: AMARYL  1 tab p.o. daily. with largest meal of the day.   Lancet Device Misc Check glucose twice daily.   Lancets Misc. Misc Check glucose twice daily.   lisinopril  40 MG tablet Commonly known as: ZESTRIL  Take 1 tablet (40 mg total) by mouth daily.   metFORMIN  1000 MG tablet Commonly known as: GLUCOPHAGE  Take 1 tablet (1,000 mg total) by mouth daily with breakfast.   OneTouch Delica Plus Lancet33G Misc Apply topically 2 (two) times daily.        All past medical history, surgical history, allergies, family history, immunizations andmedications were updated in the EMR today and reviewed under the history and medication portions of their EMR.     ROS Negative, with the exception of above mentioned in HPI   Objective:  BP 132/80   Pulse 66   Temp 98.2 F (36.8 C)   Wt 174 lb 12.8 oz (79.3 kg)   LMP 04/27/2005 (Approximate)   SpO2 98%   BMI 31.97 kg/m  Body mass index is 31.97 kg/m. Physical Exam Vitals and nursing note reviewed.  Constitutional:      General: She is not in acute distress.    Appearance: Normal appearance. She is normal weight. She is not ill-appearing or toxic-appearing.  HENT:     Head: Normocephalic and atraumatic.  Eyes:     General: No scleral icterus.       Right eye: No discharge.        Left eye: No discharge.     Extraocular Movements: Extraocular movements intact.     Conjunctiva/sclera: Conjunctivae normal.     Pupils:  Pupils are equal, round, and reactive to light.  Skin:    Findings: No rash.  Neurological:     Mental Status: She is alert and oriented to person, place, and time. Mental status is at baseline.     Motor: No weakness.     Coordination: Coordination normal.     Gait: Gait normal.  Psychiatric:        Mood and Affect: Mood normal.  Behavior: Behavior normal.        Thought Content: Thought content normal.        Judgment: Judgment normal.    Diabetic Foot Exam - Simple   Simple Foot Form Diabetic Foot exam was performed with the following findings: Yes 12/07/2023 10:22 AM  Visual Inspection No deformities, no ulcerations, no other skin breakdown bilaterally: Yes Sensation Testing Intact to touch and monofilament testing bilaterally: Yes Pulse Check Posterior Tibialis and Dorsalis pulse intact bilaterally: Yes Comments     No results found. No results found. Results for orders placed or performed in visit on 12/07/23 (from the past 24 hours)  POCT HgB A1C     Status: Abnormal   Collection Time: 12/07/23 10:05 AM  Result Value Ref Range   Hemoglobin A1C 7.1 (A) 4.0 - 5.6 %   HbA1c POC (<> result, manual entry) 7.1 4.0 - 5.6 %   HbA1c, POC (prediabetic range) 7.1 (A) 5.7 - 6.4 %   HbA1c, POC (controlled diabetic range) 7.1 (A) 0.0 - 7.0 %    Assessment/Plan: IRENE COLLINGS is a 68 y.o. female present for OV for  Type 2 diabetes mellitus with hyperlipidemia (HCC)/morbid obesity Discussed low glycemic diet in detail.  Routine exercise recommended.  NEVER STARTED- would recommend she did-  amaryl  1 mg with breakfast and metformin  1000 mg with dinner goal < 7 Continue metformin  to 1000 mg qd -She was encouraged to focus on her diet and exercise  PNA series: Pneumonia series completed, due to have Prevnar 20 after 2026 Flu shot: UTD 2024 (recommneded yearly) Foot exam: Completed 09/23/2023 Eye exam: Completed 06/21/2023 - Urine microalbumin-completed 08/30/2023,  microalbumin creatinine ratio now normal. A1c: 6.7>> 6.7>> 6.3>> 6.5> 6.6> 7.3> 6.9>6.4>6.9> 7.3> 7.7> 6.7>8.3> 7.1 collected today   Essential/hypertension/obesity/palpitations/murmur/WCS/Diastolic dysfx- mild/regurg/enlarged aorta Stable Continue lisinopril  to 40 mg daily  Continue amlodipine  2.5 mg daily  - her BP readings fluctuate frequently in response to stress/anxiety,reassured her today, not to worry about BP unless < 100 systolic with symptoms or consistently > 140 systolic.  - has echo and cardiac CT scheduled end of August with Dr. Court   Osteopenia of neck of left femur/vit d def - DG Bone Density 10/04/2023 utd - VITAMIN D  25 Hydroxy (Vit-D Deficiency, Fractures)-UTD  B12 deficiency - continue inj- provided today   Reviewed expectations re: course of current medical issues. Discussed self-management of symptoms. Outlined signs and symptoms indicating need for more acute intervention. Patient verbalized understanding and all questions were answered. Patient received an After-Visit Summary.    Orders Placed This Encounter  Procedures   POCT HgB A1C   Meds ordered this encounter  Medications   cyanocobalamin  (VITAMIN B12) injection 1,000 mcg   amLODipine  (NORVASC ) 2.5 MG tablet    Sig: Take 1 tablet (2.5 mg total) by mouth daily.    Dispense:  90 tablet    Refill:  1   diclofenac  (VOLTAREN ) 75 MG EC tablet    Sig: Take 1 tablet (75 mg total) by mouth 2 (two) times daily.    Dispense:  180 tablet    Refill:  1   glimepiride  (AMARYL ) 1 MG tablet    Sig: 1 tab p.o. daily. with largest meal of the day.    Dispense:  90 tablet    Refill:  1   lisinopril  (ZESTRIL ) 40 MG tablet    Sig: Take 1 tablet (40 mg total) by mouth daily.    Dispense:  90 tablet    Refill:  1   metFORMIN  (GLUCOPHAGE ) 1000 MG tablet    Sig: Take 1 tablet (1,000 mg total) by mouth daily with breakfast.    Dispense:  90 tablet    Refill:  1   Referral Orders  No referral(s) requested  today     Note is dictated utilizing voice recognition software. Although note has been proof read prior to signing, occasional typographical errors still can be missed. If any questions arise, please do not hesitate to call for verification.   electronically signed by:  Charlies Bellini, DO  [facility] [place]

## 2023-12-08 ENCOUNTER — Ambulatory Visit

## 2023-12-08 ENCOUNTER — Telehealth: Payer: Self-pay | Admitting: Cardiovascular Disease

## 2023-12-08 NOTE — Telephone Encounter (Signed)
 Patient wants a provider switch from Dr. Court to Dr. Verlin.

## 2023-12-16 ENCOUNTER — Ambulatory Visit: Payer: Self-pay | Admitting: Cardiovascular Disease

## 2023-12-16 ENCOUNTER — Other Ambulatory Visit (HOSPITAL_BASED_OUTPATIENT_CLINIC_OR_DEPARTMENT_OTHER)

## 2023-12-16 ENCOUNTER — Ambulatory Visit (HOSPITAL_BASED_OUTPATIENT_CLINIC_OR_DEPARTMENT_OTHER)
Admission: RE | Admit: 2023-12-16 | Discharge: 2023-12-16 | Disposition: A | Discharge status: Home / Self Care | Payer: Self-pay | Source: Ambulatory Visit | Attending: Cardiovascular Disease | Admitting: Cardiovascular Disease

## 2023-12-16 DIAGNOSIS — I071 Rheumatic tricuspid insufficiency: Secondary | ICD-10-CM

## 2023-12-16 DIAGNOSIS — I349 Nonrheumatic mitral valve disorder, unspecified: Secondary | ICD-10-CM

## 2023-12-16 DIAGNOSIS — I10 Essential (primary) hypertension: Secondary | ICD-10-CM | POA: Insufficient documentation

## 2023-12-16 DIAGNOSIS — R002 Palpitations: Secondary | ICD-10-CM | POA: Insufficient documentation

## 2023-12-16 DIAGNOSIS — G4733 Obstructive sleep apnea (adult) (pediatric): Secondary | ICD-10-CM

## 2023-12-16 LAB — ECHOCARDIOGRAM COMPLETE
Area-P 1/2: 2.87 cm2
S' Lateral: 1.78 cm

## 2024-01-03 ENCOUNTER — Ambulatory Visit (INDEPENDENT_AMBULATORY_CARE_PROVIDER_SITE_OTHER)

## 2024-01-03 DIAGNOSIS — Z23 Encounter for immunization: Secondary | ICD-10-CM

## 2024-01-03 DIAGNOSIS — E538 Deficiency of other specified B group vitamins: Secondary | ICD-10-CM | POA: Diagnosis not present

## 2024-01-03 MED ORDER — CYANOCOBALAMIN 1000 MCG/ML IJ SOLN
1000.0000 ug | Freq: Once | INTRAMUSCULAR | Status: AC
  Administered 2024-01-03: 1000 ug via INTRAMUSCULAR

## 2024-01-03 NOTE — Progress Notes (Signed)
 Pt here for monthly B12 injection per original order dated:    Last B12 injection:11/05/23   Last B12 level:  09/23/23   B12 1000mcg given IM, and pt tolerated injection well.   Next B12 injection scheduled for: 1 month

## 2024-01-05 ENCOUNTER — Ambulatory Visit: Payer: Self-pay

## 2024-01-05 NOTE — Telephone Encounter (Signed)
 FYI Only or Action Required?: Action required by provider: clinical question for provider.  Patient was last seen in primary care on 12/07/2023 by Catherine Fuller A, DO.  Called Nurse Triage reporting Hypotension.  Symptoms began today.  Interventions attempted: Other: has not taken bp medication today.  Symptoms are: unchanged.  Triage Disposition: See Physician Within 24 Hours  Patient/caregiver understands and will follow disposition?: Yes   Copied from CRM [id number]. Topic: Clinical - Red Word Triage >> Jan 05, 2024  3:49 PM Robinson H wrote: [facility] that prompted transfer to Nurse Triage: On 2 different blood pressure medications getting low readings not feeling right 118/66 pulse 91, 106/57 pulse 88, 96/61 pulse 95 Reason for Disposition  [1] Systolic BP 90-110 AND [2] taking blood pressure medications AND [3] NOT feeling weak or lightheaded  Answer Assessment - Initial Assessment Questions Additional info:  1) Flu shot yesterday 2) she has not taking her blood pressure medication today she would like from pcp if she should hold her doses today of lisinopril  & amlodipine . She would like response today, will check MyChart.   1. BLOOD PRESSURE: What is your blood pressure? Did you take at least two measurements 5 minutes apart?     118/66 91, 106/57, 88, 96/61, 95 2. ONSET: When did you take your blood pressure?     Before call 3. HOW: How did you take your blood pressure? (e.g., visiting nurse, automatic home BP monitor)     Home cuff 4. HISTORY: Do you have a history of low blood pressure? What is your blood pressure normally?      5. MEDICINES: Are you taking any medicines for blood pressure? If Yes, ask: Have they been changed recently?     No change   lisinopril , amlodipine  6. PULSE RATE: Do you know what your pulse rate is?      95 7. OTHER SYMPTOMS: Have you been sick recently? Have you had a recent injury?     Malaise, foggy brain  Protocols  used: Blood Pressure - Low-A-AH

## 2024-01-05 NOTE — Telephone Encounter (Signed)
Pt has appointment scheduled for tomorrow.

## 2024-01-06 ENCOUNTER — Encounter: Payer: Self-pay | Admitting: Family Medicine

## 2024-01-06 ENCOUNTER — Ambulatory Visit (INDEPENDENT_AMBULATORY_CARE_PROVIDER_SITE_OTHER): Admitting: Family Medicine

## 2024-01-06 VITALS — BP 138/82 | HR 65 | Temp 98.1°F | Wt 174.6 lb

## 2024-01-06 DIAGNOSIS — I10 Essential (primary) hypertension: Secondary | ICD-10-CM | POA: Diagnosis not present

## 2024-01-06 DIAGNOSIS — R531 Weakness: Secondary | ICD-10-CM | POA: Diagnosis not present

## 2024-01-06 DIAGNOSIS — I7789 Other specified disorders of arteries and arterioles: Secondary | ICD-10-CM

## 2024-01-06 DIAGNOSIS — R5383 Other fatigue: Secondary | ICD-10-CM

## 2024-01-06 DIAGNOSIS — R42 Dizziness and giddiness: Secondary | ICD-10-CM

## 2024-01-06 DIAGNOSIS — G479 Sleep disorder, unspecified: Secondary | ICD-10-CM

## 2024-01-06 LAB — CBC WITH DIFFERENTIAL/PLATELET
Basophils Absolute: 0 K/uL (ref 0.0–0.1)
Basophils Relative: 0.7 % (ref 0.0–3.0)
Eosinophils Absolute: 0.1 K/uL (ref 0.0–0.7)
Eosinophils Relative: 1.8 % (ref 0.0–5.0)
HCT: 39.9 % (ref 36.0–46.0)
Hemoglobin: 13.5 g/dL (ref 12.0–15.0)
Lymphocytes Relative: 36.8 % (ref 12.0–46.0)
Lymphs Abs: 2.3 K/uL (ref 0.7–4.0)
MCHC: 33.8 g/dL (ref 30.0–36.0)
MCV: 86.3 fl (ref 78.0–100.0)
Monocytes Absolute: 0.6 K/uL (ref 0.1–1.0)
Monocytes Relative: 8.9 % (ref 3.0–12.0)
Neutro Abs: 3.2 K/uL (ref 1.4–7.7)
Neutrophils Relative %: 51.8 % (ref 43.0–77.0)
Platelets: 158 K/uL (ref 150.0–400.0)
RBC: 4.62 Mil/uL (ref 3.87–5.11)
RDW: 13.4 % (ref 11.5–15.5)
WBC: 6.2 K/uL (ref 4.0–10.5)

## 2024-01-06 LAB — COMPREHENSIVE METABOLIC PANEL WITH GFR
ALT: 13 U/L (ref 0–35)
AST: 14 U/L (ref 0–37)
Albumin: 4.6 g/dL (ref 3.5–5.2)
Alkaline Phosphatase: 119 U/L — ABNORMAL HIGH (ref 39–117)
BUN: 10 mg/dL (ref 6–23)
CO2: 28 meq/L (ref 19–32)
Calcium: 9.9 mg/dL (ref 8.4–10.5)
Chloride: 101 meq/L (ref 96–112)
Creatinine, Ser: 0.76 mg/dL (ref 0.40–1.20)
GFR: 79.53 mL/min (ref >60.00)
Glucose, Bld: 155 mg/dL — ABNORMAL HIGH (ref 70–99)
Potassium: 4.4 meq/L (ref 3.5–5.1)
Sodium: 138 meq/L (ref 135–145)
Total Bilirubin: 1.3 mg/dL — ABNORMAL HIGH (ref 0.2–1.2)
Total Protein: 7.6 g/dL (ref 6.0–8.3)

## 2024-01-06 LAB — IBC + FERRITIN
Ferritin: 51.1 ng/mL (ref 10.0–291.0)
Iron: 78 ug/dL (ref 42–145)
Saturation Ratios: 18.1 % — ABNORMAL LOW (ref 20.0–50.0)
TIBC: 431.2 ug/dL (ref 250.0–450.0)
Transferrin: 308 mg/dL (ref 212.0–360.0)

## 2024-01-06 LAB — TSH: TSH: 0.69 u[IU]/mL (ref 0.35–5.50)

## 2024-01-06 LAB — VITAMIN D 25 HYDROXY (VIT D DEFICIENCY, FRACTURES): VITD: 28.54 ng/mL — ABNORMAL LOW (ref 30.00–100.00)

## 2024-01-06 NOTE — Progress Notes (Unsigned)
 Maria Watkins , May 12, 1955, 68 y.o., female MRN: [id number] Patient Care Team    Relationship Specialty Notifications Start End  Catherine Charlies LABOR, DO PCP - General Family Medicine  12/26/21   Kristie Lamprey, MD Consulting Physician Gastroenterology  01/18/18   Gretta Gums, MD Consulting Physician Obstetrics  01/18/18   Elspeth Lauraine DEL, OD Referring Physician   04/09/21   Liam Lerner, MD Consulting Physician Orthopedic Surgery  06/10/23   Cesario Boer, MD Attending Physician Physical Medicine and Rehabilitation  06/10/23    Comment: lumbar epi inj    Chief Complaint  Patient presents with   Low BP     Fluctuating BP since yesterday. Pt has not taken BP meds.  Pt mentions feeling fatigue, having brain fog. Pt received flu shot on Monday.      Subjective: Maria Watkins is a 68 y.o. Pt presents for acute concern of low BP and brain fog   Pt is prescribed lisinopril  40 mg daily and amlodipine  2.5  mg daily.  - her BP readings fluctuate frequently in response to stress/anxiety, and reassurance has been required in the past for her not to worry about BP unless < 100 systolic with symptoms or consistently > 140 systolic.   Patient presents today for fluctuating blood pressures since yesterday.  She reports blood pressures of***.  Because she was concerned she has not taken blood pressure medication yet this morning.  She has been feeling more fatigued and having brain fog.  She feels the fatigue and brain fog started*** Patient received her flu shot on Monday, 3 days ago.      12/07/2023   10:02 AM 09/23/2023    7:42 AM 06/10/2023   10:17 AM 01/14/2023    8:30 AM 01/06/2023    3:34 PM  Depression screen PHQ 2/9  Decreased Interest 1 1 1 1  0  Down, Depressed, Hopeless 1 0 1 1 0  PHQ - 2 Score 2 1 2 2  0  Altered sleeping 1 1 1 1    Tired, decreased energy 1 1 1 1    Change in appetite 0 0 0 0   Feeling bad or failure about yourself  1 1 0 0   Trouble concentrating 1 0 0 0    Moving slowly or fidgety/restless 1 0 1 0   Suicidal thoughts 0 0 0 0   PHQ-9 Score 7 4 5 4    Difficult doing work/chores Somewhat difficult Somewhat difficult Somewhat difficult Not difficult at all     Allergies  Allergen Reactions   Terconazole Nausea Only   Fosamax  [Alendronate ] Diarrhea and Nausea Only   Social History   Social History Narrative   Not on file   Past Medical History:  Diagnosis Date   Abnormal Pap smear of cervix 1980's   resolved post cryo with normal follow up   Calculus of gallbladder without cholecystitis without obstruction 05/19/2019   Depression    Elevated alkaline phosphatase level 05/15/2019   Estrogen deficiency 05/15/2019   FHx: breast cancer- x2 Mat aunt 11/17/2022   Gallstones 08/04/2012   GERD (gastroesophageal reflux disease) 11/09/2019   Hypertension    Lymphadenopathy 08/02/2019   Type 2 diabetes mellitus without complication, without long-term current use of insulin (HCC) 01/18/2018   Past Surgical History:  Procedure Laterality Date   CERVIX LESION DESTRUCTION  1980s   CHOLECYSTECTOMY N/A 01/13/2021   Procedure: LAPAROSCOPIC CHOLECYSTECTOMY WITH INTRAOPERATIVE CHOLANGIOGRAM;  Surgeon: Curvin Deward MOULD, MD;  Location: [facility]  OR;  Service: General;  Laterality: N/A;   COLPOSCOPY  1980's   LUMBAR EPIDURAL INJECTION     L4-L5, May end up also with L2-3 in the future- Dr. Cesario   Family History  Problem Relation Age of Onset   Diabetes Mother    Arthritis Mother    COPD Father    Asthma Father    Heart disease Father    Diabetes Sister    Asthma Brother    COPD Sister    Rheum arthritis Sister    Pulmonary fibrosis Sister        Passed away 2020/05/03  Breast cancer Maternal Aunt    Alcohol abuse Son    Allergies as of 01/06/2024       Reactions   Terconazole Nausea Only   Fosamax  [alendronate ] Diarrhea, Nausea Only        Medication List        Accurate as of January 06, 2024 11:23 AM. If you have any questions,  ask your nurse or doctor.          amLODipine  2.5 MG tablet Commonly known as: NORVASC  Take 1 tablet (2.5 mg total) by mouth daily.   Blood Glucose Monitoring Suppl Devi Check glucose twice daily.   OneTouch Verio Reflect w/Device Kit CHECK GLUCOSE TWICE DAILY   BLOOD GLUCOSE TEST STRIPS Strp Check glucose twice a day.   cholecalciferol 25 MCG (1000 UNIT) tablet Commonly known as: VITAMIN D3 Take 2,000 Units by mouth daily.   diclofenac  75 MG EC tablet Commonly known as: VOLTAREN  Take 1 tablet (75 mg total) by mouth 2 (two) times daily.   fluticasone  50 MCG/ACT nasal spray Commonly known as: FLONASE  Place 2 sprays into both nostrils in the morning and at bedtime.   glimepiride  1 MG tablet Commonly known as: AMARYL  1 tab p.o. daily. with largest meal of the day.   Lancet Device Misc Check glucose twice daily.   Lancets Misc. Misc Check glucose twice daily.   lisinopril  40 MG tablet Commonly known as: ZESTRIL  Take 1 tablet (40 mg total) by mouth daily.   metFORMIN  1000 MG tablet Commonly known as: GLUCOPHAGE  Take 1 tablet (1,000 mg total) by mouth daily with breakfast.   OneTouch Delica Plus Lancet33G Misc Apply topically 2 (two) times daily.        All past medical history, surgical history, allergies, family history, immunizations andmedications were updated in the EMR today and reviewed under the history and medication portions of their EMR.     ROS Negative, with the exception of above mentioned in HPI   Objective:  BP 138/82   Pulse 65   Temp 98.1 F (36.7 C)   Wt 174 lb 9.6 oz (79.2 kg)   LMP 04/27/2005 (Approximate)   SpO2 98%   BMI 31.93 kg/m  Body mass index is 31.93 kg/m. Physical Exam Vitals and nursing note reviewed.  Constitutional:      General: She is not in acute distress.    Appearance: Normal appearance. She is normal weight. She is not ill-appearing or toxic-appearing.  HENT:     Head: Normocephalic and atraumatic.   Eyes:     General: No scleral icterus.       Right eye: No discharge.        Left eye: No discharge.     Extraocular Movements: Extraocular movements intact.     Conjunctiva/sclera: Conjunctivae normal.     Pupils: Pupils are equal, round, and reactive to light.  Skin:    Findings: No rash.  Neurological:     Mental Status: She is alert and oriented to person, place, and time. Mental status is at baseline.     Motor: No weakness.     Coordination: Coordination normal.     Gait: Gait normal.  Psychiatric:        Mood and Affect: Mood normal.        Behavior: Behavior normal.        Thought Content: Thought content normal.        Judgment: Judgment normal.     No results found. No results found. No results found for this or any previous visit (from the past 24 hours).   Assessment/Plan: Maria Watkins is a 68 y.o. female present for OV for  *** -Patient reassured she cannot contract the influenza illness problem the vaccine. -Patient encouraged to*** -Patient encouraged to hydrate well   Reviewed expectations re: course of current medical issues. Discussed self-management of symptoms. Outlined signs and symptoms indicating need for more acute intervention. Patient verbalized understanding and all questions were answered. Patient received an After-Visit Summary.    Orders Placed This Encounter  Procedures   CBC w/Diff   Comp Met (CMET)   TSH   Vitamin D  (25 hydroxy)   IBC + Ferritin   Urinalysis w microscopic + reflex cultur   No orders of the defined types were placed in this encounter.  Referral Orders  No referral(s) requested today     Note is dictated utilizing voice recognition software. Although note has been proof read prior to signing, occasional typographical errors still can be missed. If any questions arise, please do not hesitate to call for verification.   electronically signed by:  Charlies Bellini, DO  [facility] [place]

## 2024-01-07 ENCOUNTER — Ambulatory Visit: Payer: Self-pay | Admitting: Family Medicine

## 2024-01-07 ENCOUNTER — Encounter: Payer: Self-pay | Admitting: Family Medicine

## 2024-01-08 ENCOUNTER — Other Ambulatory Visit: Payer: Self-pay | Admitting: Family Medicine

## 2024-01-08 DIAGNOSIS — E1169 Type 2 diabetes mellitus with other specified complication: Secondary | ICD-10-CM

## 2024-01-08 LAB — URINALYSIS W MICROSCOPIC + REFLEX CULTURE
Bacteria, UA: NONE SEEN /HPF
Bilirubin Urine: NEGATIVE
Glucose, UA: NEGATIVE
Hgb urine dipstick: NEGATIVE
Hyaline Cast: NONE SEEN /LPF
Ketones, ur: NEGATIVE
Nitrites, Initial: NEGATIVE
RBC / HPF: NONE SEEN /HPF (ref 0–2)
Specific Gravity, Urine: 1.018 (ref 1.001–1.035)
pH: 8 (ref 5.0–8.0)

## 2024-01-08 LAB — CULTURE INDICATED

## 2024-01-08 LAB — URINE CULTURE
MICRO NUMBER:: 16958919
SPECIMEN QUALITY:: ADEQUATE

## 2024-01-22 ENCOUNTER — Encounter: Payer: Self-pay | Admitting: Family Medicine

## 2024-01-22 DIAGNOSIS — G4733 Obstructive sleep apnea (adult) (pediatric): Secondary | ICD-10-CM

## 2024-01-24 NOTE — Telephone Encounter (Signed)
No further action needed.   Please see other encounter.

## 2024-01-24 NOTE — Telephone Encounter (Signed)
 No further action needed.

## 2024-01-24 NOTE — Telephone Encounter (Signed)
 Referral to pulmonology placed for sleep apnea eval  Please inform patient and neurology referral can be canceled

## 2024-02-04 ENCOUNTER — Ambulatory Visit

## 2024-02-04 DIAGNOSIS — E538 Deficiency of other specified B group vitamins: Secondary | ICD-10-CM

## 2024-02-04 MED ORDER — CYANOCOBALAMIN 1000 MCG/ML IJ SOLN
1000.0000 ug | Freq: Once | INTRAMUSCULAR | Status: AC
  Administered 2024-02-04: 1000 ug via INTRAMUSCULAR

## 2024-02-04 NOTE — Progress Notes (Signed)
 Pt here for monthly B12 injection per original order dated:    Last B12 injection:01/03/24   Last B12 level:  09/23/23   B12 1000mcg given IM, and pt tolerated injection well.   Next B12 injection scheduled for: 1 month

## 2024-02-09 ENCOUNTER — Ambulatory Visit: Admitting: *Deleted

## 2024-02-09 VITALS — Ht 62.0 in | Wt 174.0 lb

## 2024-02-09 DIAGNOSIS — Z0000 Encounter for general adult medical examination without abnormal findings: Secondary | ICD-10-CM | POA: Diagnosis not present

## 2024-02-09 NOTE — Patient Instructions (Signed)
 [name] , Thank you for taking time to come for your Medicare Wellness Visit. I appreciate your ongoing commitment to your health goals. Please review the following plan we discussed and let me know if I can assist you in the future.   Screening recommendations/referrals: Colonoscopy:  Mammogram:  Bone Density:  Recommended yearly ophthalmology/optometry visit for glaucoma screening and checkup Recommended yearly dental visit for hygiene and checkup  Vaccinations: Influenza vaccine:  Pneumococcal vaccine:  Tdap vaccine:  Shingles vaccine:         Preventive Care 65 Years and Older, Female Preventive care refers to lifestyle choices and visits with your health care provider that can promote health and wellness. What does preventive care include? A yearly physical exam. This is also called an annual well check. Dental exams once or twice a year. Routine eye exams. Ask your health care provider how often you should have your eyes checked. Personal lifestyle choices, including: Daily care of your teeth and gums. Regular physical activity. Eating a healthy diet. Avoiding tobacco and drug use. Limiting alcohol use. Practicing safe sex. Taking low-dose aspirin every day. Taking vitamin and mineral supplements as recommended by your health care provider. What happens during an annual well check? The services and screenings done by your health care provider during your annual well check will depend on your age, overall health, lifestyle risk factors, and family history of disease. Counseling  Your health care provider may ask you questions about your: Alcohol use. Tobacco use. Drug use. Emotional well-being. Home and relationship well-being. Sexual activity. Eating habits. History of falls. Memory and ability to understand (cognition). Work and work Astronomer. Reproductive health. Screening  You may have the following tests or measurements: Height, weight, and  BMI. Blood pressure. Lipid and cholesterol levels. These may be checked every 5 years, or more frequently if you are over 36 years old. Skin check. Lung cancer screening. You may have this screening every year starting at age 61 if you have a 30-pack-year history of smoking and currently smoke or have quit within the past 15 years. Fecal occult blood test (FOBT) of the stool. You may have this test every year starting at age 59. Flexible sigmoidoscopy or colonoscopy. You may have a sigmoidoscopy every 5 years or a colonoscopy every 10 years starting at age 72. Hepatitis C blood test. Hepatitis B blood test. Sexually transmitted disease (STD) testing. Diabetes screening. This is done by checking your blood sugar (glucose) after you have not eaten for a while (fasting). You may have this done every 1-3 years. Bone density scan. This is done to screen for osteoporosis. You may have this done starting at age 76. Mammogram. This may be done every 1-2 years. Talk to your health care provider about how often you should have regular mammograms. Talk with your health care provider about your test results, treatment options, and if necessary, the need for more tests. Vaccines  Your health care provider may recommend certain vaccines, such as: Influenza vaccine. This is recommended every year. Tetanus, diphtheria, and acellular pertussis (Tdap, Td) vaccine. You may need a Td booster every 10 years. Zoster vaccine. You may need this after age 78. Pneumococcal 13-valent conjugate (PCV13) vaccine. One dose is recommended after age 63. Pneumococcal polysaccharide (PPSV23) vaccine. One dose is recommended after age 75. Talk to your health care provider about which screenings and vaccines you need and how often you need them. This information is not intended to replace advice given to you by your  health care provider. Make sure you discuss any questions you have with your health care provider. Document  Released: 05/10/2015 Document Revised: 01/01/2016 Document Reviewed: 02/12/2015 Elsevier Interactive Patient Education  2017 [employer].  Fall Prevention in the Home Falls can cause injuries. They can happen to people of all ages. There are many things you can do to make your home safe and to help prevent falls. What can I do on the outside of my home? Regularly fix the edges of walkways and driveways and fix any cracks. Remove anything that might make you trip as you walk through a door, such as a raised step or threshold. Trim any bushes or trees on the path to your home. Use bright outdoor lighting. Clear any walking paths of anything that might make someone trip, such as rocks or tools. Regularly check to see if handrails are loose or broken. Make sure that both sides of any steps have handrails. Any raised decks and porches should have guardrails on the edges. Have any leaves, snow, or ice cleared regularly. Use sand or salt on walking paths during winter. Clean up any spills in your garage right away. This includes oil or grease spills. What can I do in the bathroom? Use night lights. Install grab bars by the toilet and in the tub and shower. Do not use towel bars as grab bars. Use non-skid mats or decals in the tub or shower. If you need to sit down in the shower, use a plastic, non-slip stool. Keep the floor dry. Clean up any water that spills on the floor as soon as it happens. Remove soap buildup in the tub or shower regularly. Attach bath mats securely with double-sided non-slip rug tape. Do not have throw rugs and other things on the floor that can make you trip. What can I do in the bedroom? Use night lights. Make sure that you have a light by your bed that is easy to reach. Do not use any sheets or blankets that are too big for your bed. They should not hang down onto the floor. Have a firm chair that has side arms. You can use this for support while you get dressed. Do  not have throw rugs and other things on the floor that can make you trip. What can I do in the kitchen? Clean up any spills right away. Avoid walking on wet floors. Keep items that you use a lot in easy-to-reach places. If you need to reach something above you, use a strong step stool that has a grab bar. Keep electrical cords out of the way. Do not use floor polish or wax that makes floors slippery. If you must use wax, use non-skid floor wax. Do not have throw rugs and other things on the floor that can make you trip. What can I do with my stairs? Do not leave any items on the stairs. Make sure that there are handrails on both sides of the stairs and use them. Fix handrails that are broken or loose. Make sure that handrails are as long as the stairways. Check any carpeting to make sure that it is firmly attached to the stairs. Fix any carpet that is loose or worn. Avoid having throw rugs at the top or bottom of the stairs. If you do have throw rugs, attach them to the floor with carpet tape. Make sure that you have a light switch at the top of the stairs and the bottom of the stairs. If you do  not have them, ask someone to add them for you. What else can I do to help prevent falls? Wear shoes that: Do not have high heels. Have rubber bottoms. Are comfortable and fit you well. Are closed at the toe. Do not wear sandals. If you use a stepladder: Make sure that it is fully opened. Do not climb a closed stepladder. Make sure that both sides of the stepladder are locked into place. Ask someone to hold it for you, if possible. Clearly mark and make sure that you can see: Any grab bars or handrails. First and last steps. Where the edge of each step is. Use tools that help you move around (mobility aids) if they are needed. These include: Canes. Walkers. Scooters. Crutches. Turn on the lights when you go into a dark area. Replace any light bulbs as soon as they burn out. Set up your  furniture so you have a clear path. Avoid moving your furniture around. If any of your floors are uneven, fix them. If there are any pets around you, be aware of where they are. Review your medicines with your doctor. Some medicines can make you feel dizzy. This can increase your chance of falling. Ask your doctor what other things that you can do to help prevent falls. This information is not intended to replace advice given to you by your health care provider. Make sure you discuss any questions you have with your health care provider. Document Released: 02/07/2009 Document Revised: 09/19/2015 Document Reviewed: 05/18/2014 Elsevier Interactive Patient Education  2017 [employer].

## 2024-02-09 NOTE — Progress Notes (Signed)
 Subjective:   Maria Watkins is a 68 y.o. female who presents for Medicare Annual (Subsequent) preventive examination.  Visit Complete: Virtual I connected with  Greenly L Mettler on 02/09/24 by a audio enabled telemedicine application and verified that I am speaking with the correct person using two identifiers.  Patient Location: Home  Provider Location: Home Office  I discussed the limitations of evaluation and management by telemedicine. The patient expressed understanding and agreed to proceed.  Vital Signs: Because this visit was a virtual/telehealth visit, some criteria may be missing or patient reported. Any vitals not documented were not able to be obtained and vitals that have been documented are patient reported.  Cardiac Risk Factors include: advanced age (>53men, >62 women);hypertension;diabetes mellitus;family history of premature cardiovascular disease;obesity (BMI >30kg/m2)     Objective:    Today's Vitals   02/09/24 0931  Weight: 174 lb (78.9 kg)  Height: 5' 2 (1.575 m)  PainSc: 3    Body mass index is 31.83 kg/m.     02/09/2024    9:47 AM 01/06/2023    3:31 PM 01/20/2021    5:47 PM 01/19/2021   10:12 AM 01/13/2021   11:53 AM 01/07/2021   11:28 AM  Advanced Directives  Does Patient Have a Medical Advance Directive? No No No No No No  Would patient like information on creating a medical advance directive? No - Patient declined No - Patient declined No - Patient declined No - Patient declined No - Patient declined No - Patient declined    Current Medications (verified) Outpatient Encounter Medications as of 02/09/2024  Medication Sig   amLODipine  (NORVASC ) 2.5 MG tablet Take 1 tablet (2.5 mg total) by mouth daily.   Blood Glucose Monitoring Suppl (ONETOUCH VERIO REFLECT) w/Device KIT CHECK GLUCOSE TWICE DAILY   Blood Glucose Monitoring Suppl DEVI Check glucose twice daily.   cholecalciferol (VITAMIN D3) 25 MCG (1000 UT) tablet Take 2,000 Units by mouth  daily.   diclofenac  (VOLTAREN ) 75 MG EC tablet Take 1 tablet (75 mg total) by mouth 2 (two) times daily.   fluticasone  (FLONASE ) 50 MCG/ACT nasal spray Place 2 sprays into both nostrils in the morning and at bedtime.   glimepiride  (AMARYL ) 1 MG tablet 1 tab p.o. daily. with largest meal of the day.   Lancet Device MISC Check glucose twice daily.   Lancets (ONETOUCH DELICA PLUS LANCET33G) MISC Apply topically 2 (two) times daily.   Lancets Misc. MISC Check glucose twice daily.   lisinopril  (ZESTRIL ) 40 MG tablet Take 1 tablet (40 mg total) by mouth daily.   metFORMIN  (GLUCOPHAGE ) 1000 MG tablet Take 1 tablet (1,000 mg total) by mouth daily with breakfast.   ONETOUCH VERIO test strip CHECK GLUCOSE TWICE A DAY   No facility-administered encounter medications on file as of 02/09/2024.    Allergies (verified) Terconazole and Fosamax  [alendronate ]   History: Past Medical History:  Diagnosis Date   Abnormal Pap smear of cervix 1980's   resolved post cryo with normal follow up   Calculus of gallbladder without cholecystitis without obstruction 05/19/2019   Depression    Elevated alkaline phosphatase level 05/15/2019   Estrogen deficiency 05/15/2019   FHx: breast cancer- x2 Mat aunt 11/17/2022   Gallstones 08/04/2012   GERD (gastroesophageal reflux disease) 11/09/2019   Hypertension    Lymphadenopathy 08/02/2019   Type 2 diabetes mellitus without complication, without long-term current use of insulin (HCC) 01/18/2018   Past Surgical History:  Procedure Laterality Date   CERVIX LESION DESTRUCTION  1980s   CHOLECYSTECTOMY N/A 01/13/2021   Procedure: LAPAROSCOPIC CHOLECYSTECTOMY WITH INTRAOPERATIVE CHOLANGIOGRAM;  Surgeon: Curvin Deward MOULD, MD;  Location: [facility];  Service: General;  Laterality: N/A;   COLPOSCOPY  1980's   LUMBAR EPIDURAL INJECTION     L4-L5, May end up also with L2-3 in the future- Dr. Cesario   Family History  Problem Relation Age of Onset   Diabetes Mother    Arthritis  Mother    COPD Father    Asthma Father    Heart disease Father    Diabetes Sister    Asthma Brother    COPD Sister    Rheum arthritis Sister    Pulmonary fibrosis Sister        Passed away 05/02/20  Breast cancer Maternal Aunt    Alcohol abuse Son    Social History   Socioeconomic History   Marital status: Married    Spouse name: Not on file   Number of children: Not on file   Years of education: Not on file   Highest education level: Not on file  Occupational History   Occupation: retired  Tobacco Use   Smoking status: Former    Current packs/day: 0.00    Types: Cigarettes    Quit date: 04/28/1991    Years since quitting: 32.8   Smokeless tobacco: Never  Vaping Use   Vaping status: Never Used  Substance and Sexual Activity   Alcohol use: No   Drug use: No   Sexual activity: Never    Birth control/protection: Post-menopausal  Other Topics Concern   Not on file  Social History Narrative   Not on file   Social Drivers of Health   Financial Resource Strain: Low Risk  (02/09/2024)   Overall Financial Resource Strain (CARDIA)    Difficulty of Paying Living Expenses: Not hard at all  Food Insecurity: No Food Insecurity (02/09/2024)   Hunger Vital Sign    Worried About Running Out of Food in the Last Year: Never true    Ran Out of Food in the Last Year: Never true  Transportation Needs: No Transportation Needs (02/09/2024)   PRAPARE - Administrator, Civil Service (Medical): No    Lack of Transportation (Non-Medical): No  Physical Activity: Insufficiently Active (02/09/2024)   Exercise Vital Sign    Days of Exercise per Week: 4 days    Minutes of Exercise per Session: 30 min  Stress: No Stress Concern Present (02/09/2024)   [employer] of Occupational Health - Occupational Stress Questionnaire    Feeling of Stress: Not at all  Social Connections: Socially Integrated (02/09/2024)   Social Connection and Isolation Panel    Frequency of  Communication with Friends and Family: More than three times a week    Frequency of Social Gatherings with Friends and Family: Once a week    Attends Religious Services: More than 4 times per year    Active Member of [employer] or Organizations: Yes    Attends Banker Meetings: 1 to 4 times per year    Marital Status: Married    Tobacco Counseling Counseling given: Not Answered   Clinical Intake:  Pre-visit preparation completed: Yes  Pain : 0-10 Pain Score: 3  Pain Location: Leg Pain Orientation: Left Pain Descriptors / Indicators: Aching, Burning, Discomfort Pain Onset: More than a month ago Pain Frequency: Intermittent     Diabetes: Yes CBG done?: No Did pt. bring in CBG monitor from home?: No  How  often do you need to have someone help you when you read instructions, pamphlets, or other written materials from your doctor or pharmacy?: 1 - Never  Interpreter Needed?: No  Information entered by :: Mliss Graff LPN   Activities of Daily Living    02/09/2024    9:36 AM  In your present state of health, do you have any difficulty performing the following activities:  Hearing? 0  Vision? 0  Difficulty concentrating or making decisions? 0  Walking or climbing stairs? 0  Dressing or bathing? 0  Doing errands, shopping? 0  Preparing Food and eating ? N  Using the Toilet? N  In the past six months, have you accidently leaked urine? N  Do you have problems with loss of bowel control? N  Managing your Medications? N  Managing your Finances? N  Housekeeping or managing your Housekeeping? N    Patient Care Team: Catherine Charlies LABOR, DO as PCP - General (Family Medicine) Kristie Lamprey, MD as Consulting Physician (Gastroenterology) Gretta Gums, MD as Consulting Physician (Obstetrics) Elspeth Lauraine DEL, OD as Referring Physician Liam Lerner, MD as Consulting Physician (Orthopedic Surgery) Cesario Boer, MD as Attending Physician (Physical Medicine and  Rehabilitation)  Indicate any recent Medical Services you may have received from other than [facility] providers in the past year (date may be approximate).     Assessment:   This is a routine wellness examination for [place].  Hearing/Vision screen Hearing Screening - Comments:: No trouble hearing Vision Screening - Comments:: Up to date   Goals Addressed             This Visit's Progress    Weight (lb) < 200 lb (90.7 kg)   174 lb (78.9 kg)    Try to get off diabetic medication       Depression Screen    02/09/2024    9:39 AM 12/07/2023   10:02 AM 09/23/2023    7:42 AM 06/10/2023   10:17 AM 01/14/2023    8:30 AM 01/06/2023    3:34 PM 09/09/2022   10:03 AM  PHQ 2/9 Scores  PHQ - 2 Score 2 2 1 2 2  0 0  PHQ- 9 Score 4 7 4 5 4       Fall Risk    02/09/2024    9:33 AM 12/07/2023   10:02 AM 09/23/2023    7:41 AM 06/10/2023   10:17 AM 01/14/2023    8:30 AM  Fall Risk   Falls in the past year? 0 0 0 0 0  Number falls in past yr: 0    0  Injury with Fall? 0    0  Follow up Falls evaluation completed;Education provided;Falls prevention discussed Falls evaluation completed Falls evaluation completed Falls evaluation completed Falls evaluation completed    MEDICARE RISK AT HOME: Medicare Risk at Home Any stairs in or around the home?: No If so, are there any without handrails?: No Home free of loose throw rugs in walkways, pet beds, electrical cords, etc?: Yes Adequate lighting in your home to reduce risk of falls?: Yes Life alert?: No Use of a cane, walker or w/c?: No Grab bars in the bathroom?: Yes Shower chair or bench in shower?: No Elevated toilet seat or a handicapped toilet?: No  TIMED UP AND GO:  Was the test performed?  No    Cognitive Function:        02/09/2024    9:36 AM 01/06/2023    3:38 PM 01/23/2022    9:37 AM  01/20/2021    5:54 PM  6CIT Screen  What Year? 0 points 0 points 0 points 0 points  What month? 0 points 0 points 0 points 0 points  What time?  0 points 0 points 0 points 0 points  Count back from 20 0 points 0 points 0 points 0 points  Months in reverse 0 points 0 points 0 points 0 points  Repeat phrase 0 points 0 points 0 points 0 points  Total Score 0 points 0 points 0 points 0 points    Immunizations Immunization History  Administered Date(s) Administered   Fluad Quad(high Dose 65+) 01/16/2019, 03/19/2021, 02/04/2022   Fluad Trivalent(High Dose 65+) 02/15/2023   INFLUENZA, HIGH DOSE SEASONAL PF 01/16/2019, 02/20/2020, 01/03/2024   Influenza Inj Mdck Quad With Preservative 12/27/2018   Influenza,inj,Quad PF,6+ Mos 01/18/2018   Moderna Sars-Covid-2 Vaccination 06/08/2019, 07/07/2019   Pneumococcal Conjugate-13 02/23/2020   Pneumococcal Polysaccharide-23 04/14/2018   Tdap 04/27/2013    TDAP status: Up to date  Flu Vaccine status: Up to date  Pneumococcal vaccine status: Up to date  Covid-19 vaccine status: Information provided on how to obtain vaccines.   Qualifies for Shingles Vaccine? Yes   Zostavax completed No   Shingrix Completed?: No.    Education has been provided regarding the importance of this vaccine. Patient has been advised to call insurance company to determine out of pocket expense if they have not yet received this vaccine. Advised may also receive vaccine at local pharmacy or Health Dept. Verbalized acceptance and understanding.  Screening Tests Health Maintenance  Topic Date Due   HEMOGLOBIN A1C  06/08/2024   OPHTHALMOLOGY EXAM  06/20/2024   Colonoscopy  08/25/2024   Diabetic kidney evaluation - Urine ACR  08/29/2024   FOOT EXAM  12/06/2024   Diabetic kidney evaluation - eGFR measurement  01/05/2025   Medicare Annual Wellness (AWV)  02/08/2025   Pneumococcal Vaccine: 50+ Years (3 of 3 - PCV20 or PCV21) 02/22/2025   Mammogram  07/19/2025   DEXA SCAN  10/03/2025   Influenza Vaccine  Completed   Hepatitis C Screening  Completed   Meningococcal B Vaccine  Aged Out   DTaP/Tdap/Td  Discontinued    COVID-19 Vaccine  Discontinued   Zoster Vaccines- Shingrix  Discontinued    Health Maintenance  There are no preventive care reminders to display for this patient.   Colorectal cancer screening: Type of screening: Colonoscopy. Completed 2020. Repeat every 10 years  Mammogram status: Completed  . Repeat every year  Bone Density status: Completed 2025. Results reflect: Bone density results: OSTEOPOROSIS. Repeat every 2 years.  Lung Cancer Screening: (Low Dose CT Chest recommended if Age 37-80 years, 20 pack-year currently smoking OR have quit w/in 15years.) does not qualify.   Lung Cancer Screening Referral:   Additional Screening:  Hepatitis C Screening: does not qualify; Completed 2017  Vision Screening: Recommended annual ophthalmology exams for early detection of glaucoma and other disorders of the eye. Is the patient up to date with their annual eye exam?  Yes  Who is the provider or what is the name of the office in which the patient attends annual eye exams?  If pt is not established with a provider, would they like to be referred to a provider to establish care? No .   Dental Screening: Recommended annual dental exams for proper oral hygiene  Nutrition Risk Assessment:  Has the patient had any N/V/D within the last 2 months?  No  Does the patient have any non-healing  wounds?  No  Has the patient had any unintentional weight loss or weight gain?  No   Diabetes:  Is the patient diabetic?  No  If diabetic, was a CBG obtained today?  Yes  Did the patient bring in their glucometer from home?  No  How often do you monitor your CBG's? 1 x a day.   Financial Strains and Diabetes Management:  Are you having any financial strains with the device, your supplies or your medication? No .  Does the patient want to be seen by Chronic Care Management for management of their diabetes?  No  Would the patient like to be referred to a Nutritionist or for Diabetic Management?  No    Diabetic Exams:  Diabetic Eye Exam: Completed  Pt has been advised about the importance in completing this exam.   Diabetic Foot Exam: . Pt has been advised about the importance in completing this exam..    Community Resource Referral / Chronic Care Management: CRR required this visit?  No   CCM required this visit?  No     Plan:     I have personally reviewed and noted the following in the patient's chart:   Medical and social history Use of alcohol, tobacco or illicit drugs  Current medications and supplements including opioid prescriptions. Patient is not currently taking opioid prescriptions. Functional ability and status Nutritional status Physical activity Advanced directives List of other physicians Hospitalizations, surgeries, and ER visits in previous 12 months Vitals Screenings to include cognitive, depression, and falls Referrals and appointments  In addition, I have reviewed and discussed with patient certain preventive protocols, quality metrics, and best practice recommendations. A written personalized care plan for preventive services as well as general preventive health recommendations were provided to patient.     Mliss Graff, LPN   [id number]   After Visit Summary: (MyChart) Due to this being a telephonic visit, the after visit summary with patients personalized plan was offered to patient via MyChart   Nurse Notes:

## 2024-02-16 ENCOUNTER — Ambulatory Visit (HOSPITAL_BASED_OUTPATIENT_CLINIC_OR_DEPARTMENT_OTHER)

## 2024-03-03 ENCOUNTER — Ambulatory Visit

## 2024-03-03 DIAGNOSIS — E538 Deficiency of other specified B group vitamins: Secondary | ICD-10-CM | POA: Diagnosis not present

## 2024-03-03 MED ORDER — CYANOCOBALAMIN 1000 MCG/ML IJ SOLN
1000.0000 ug | Freq: Once | INTRAMUSCULAR | Status: AC
  Administered 2024-03-03: 1000 ug via INTRAMUSCULAR

## 2024-03-03 NOTE — Progress Notes (Signed)
 Pt here for monthly B12 injection per Dr. Catherine   Last B12 injection:02/04/2024   B12 1000mcg given IM, and pt tolerated injection well.   Next B12 injection scheduled for: 1 month

## 2024-03-06 ENCOUNTER — Ambulatory Visit (HOSPITAL_BASED_OUTPATIENT_CLINIC_OR_DEPARTMENT_OTHER)

## 2024-03-07 ENCOUNTER — Ambulatory Visit (INDEPENDENT_AMBULATORY_CARE_PROVIDER_SITE_OTHER): Admitting: Family Medicine

## 2024-03-07 ENCOUNTER — Encounter: Payer: Self-pay | Admitting: Family Medicine

## 2024-03-07 ENCOUNTER — Ambulatory Visit: Payer: Self-pay

## 2024-03-07 VITALS — BP 130/78 | HR 59 | Temp 97.8°F | Wt 176.2 lb

## 2024-03-07 DIAGNOSIS — R42 Dizziness and giddiness: Secondary | ICD-10-CM

## 2024-03-07 NOTE — Telephone Encounter (Signed)
 FYI Only or Action Required?: Action required by provider: request for appointment.  Patient was last seen in primary care on 01/06/2024 by Catherine Fuller A, DO.  Called Nurse Triage reporting low bp.  Symptoms began several days ago.  Interventions attempted: Nothing.  Symptoms are: unchanged.BP running lower than normal, feels off balance and head feels full. No chest pain.  Triage Disposition: See PCP When Office is Open (Within 3 Days)  Patient/caregiver understands and will follow disposition?: Yes     Copied from CRM [id number]. Topic: Clinical - Red Word Triage >> Mar 07, 2024  9:30 AM Hamdi H wrote: [facility] that prompted transfer to Nurse Triage: Trouble with BP, running really low for her normal range and getting headaches. Ranges around 145/73 pulse 59 and 138/73 pulse 59. Reason for Disposition  Diastolic BP < 50 mmHg  Answer Assessment - Initial Assessment Questions 1. BLOOD PRESSURE: What is your blood pressure? Did you take at least two measurements 5 minutes apart?     138/73 2. ONSET: When did you take your blood pressure?     A few days 3. HOW: How did you take your blood pressure? (e.g., visiting nurse, automatic home BP monitor)     Home cuff 4. HISTORY: Do you have a history of low blood pressure? What is your blood pressure normally?     no 5. MEDICINES: Are you taking any medicines for blood pressure? If Yes, ask: Have they been changed recently?     High BP 6. PULSE RATE: Do you know what your pulse rate is?      59 7. OTHER SYMPTOMS: Have you been sick recently? Have you had a recent injury?     NO 8. PREGNANCY: Is there any chance you are pregnant? When was your last menstrual period?     NO  Protocols used: Blood Pressure - Low-A-AH

## 2024-03-07 NOTE — Progress Notes (Signed)
 Maria Watkins , 1955-12-13, 68 y.o., female MRN: [id number] Patient Care Team    Relationship Specialty Notifications Start End  Catherine Charlies LABOR, DO PCP - General Family Medicine  12/26/21   Kristie Lamprey, MD Consulting Physician Gastroenterology  01/18/18   Gretta Gums, MD Consulting Physician Obstetrics  01/18/18   Elspeth Lauraine DEL, OD Referring Physician   04/09/21   Liam Lerner, MD Consulting Physician Orthopedic Surgery  06/10/23   Cesario Boer, MD Attending Physician Physical Medicine and Rehabilitation  06/10/23    Comment: lumbar epi inj    Chief Complaint  Patient presents with   Dizziness    Fatigue; off balance     Subjective: Maria Watkins is a 68 y.o. Pt presents for an OV with complaints of dizziness and feeling off balance  Of 4 days duration.  Patient felt like maybe it was her blood pressure causing the symptoms and called in and concerns of low blood pressure.   Patient's home blood pressure readings are above goal ranging between 133-155/68-73.  She had 1 reading in normal range at 129/73.  Patient denies fever, chills, nausea or vomiting.  She has had mild dizziness in the morning.  She has left ear fullness.     02/09/2024    9:39 AM 12/07/2023   10:02 AM 09/23/2023    7:42 AM 06/10/2023   10:17 AM 01/14/2023    8:30 AM  Depression screen PHQ 2/9  Decreased Interest 1 1 1 1 1   Down, Depressed, Hopeless 1 1 0 1 1  PHQ - 2 Score 2 2 1 2 2   Altered sleeping 1 1 1 1 1   Tired, decreased energy 1 1 1 1 1   Change in appetite 0 0 0 0 0  Feeling bad or failure about yourself  0 1 1 0 0  Trouble concentrating 0 1 0 0 0  Moving slowly or fidgety/restless 0 1 0 1 0  Suicidal thoughts 0 0 0 0 0  PHQ-9 Score 4  7  4  5  4    Difficult doing work/chores  Somewhat difficult Somewhat difficult Somewhat difficult Not difficult at all     Data saved with a previous flowsheet row definition    Allergies  Allergen Reactions   Terconazole Nausea Only   Fosamax   [Alendronate ] Diarrhea and Nausea Only   Social History   Social History Narrative   Not on file   Past Medical History:  Diagnosis Date   Abnormal Pap smear of cervix 1980's   resolved post cryo with normal follow up   Calculus of gallbladder without cholecystitis without obstruction 05/19/2019   Depression    Elevated alkaline phosphatase level 05/15/2019   Estrogen deficiency 05/15/2019   FHx: breast cancer- x2 Mat aunt 11/17/2022   Gallstones 08/04/2012   GERD (gastroesophageal reflux disease) 11/09/2019   Hypertension    Lymphadenopathy 08/02/2019   Type 2 diabetes mellitus without complication, without long-term current use of insulin (HCC) 01/18/2018   Past Surgical History:  Procedure Laterality Date   CERVIX LESION DESTRUCTION  1980s   CHOLECYSTECTOMY N/A 01/13/2021   Procedure: LAPAROSCOPIC CHOLECYSTECTOMY WITH INTRAOPERATIVE CHOLANGIOGRAM;  Surgeon: Curvin Deward MOULD, MD;  Location: [facility] OR;  Service: General;  Laterality: N/A;   COLPOSCOPY  1980's   LUMBAR EPIDURAL INJECTION     L4-L5, May end up also with L2-3 in the future- Dr. Cesario   Family History  Problem Relation Age of Onset   Diabetes Mother  Arthritis Mother    COPD Father    Asthma Father    Heart disease Father    Diabetes Sister    Asthma Brother    COPD Sister    Rheum arthritis Sister    Pulmonary fibrosis Sister        Passed away 06-May-2020  Breast cancer Maternal Aunt    Alcohol abuse Son    Allergies as of 03/07/2024       Reactions   Terconazole Nausea Only   Fosamax  [alendronate ] Diarrhea, Nausea Only        Medication List        Accurate as of March 07, 2024  2:57 PM. If you have any questions, ask your nurse or doctor.          amLODipine  2.5 MG tablet Commonly known as: NORVASC  Take 1 tablet (2.5 mg total) by mouth daily.   Blood Glucose Monitoring Suppl Devi Check glucose twice daily.   OneTouch Verio Reflect w/Device Kit CHECK GLUCOSE TWICE DAILY    cholecalciferol 25 MCG (1000 UNIT) tablet Commonly known as: VITAMIN D3 Take 2,000 Units by mouth daily.   diclofenac  75 MG EC tablet Commonly known as: VOLTAREN  Take 1 tablet (75 mg total) by mouth 2 (two) times daily.   fluticasone  50 MCG/ACT nasal spray Commonly known as: FLONASE  Place 2 sprays into both nostrils in the morning and at bedtime.   glimepiride  1 MG tablet Commonly known as: AMARYL  1 tab p.o. daily. with largest meal of the day.   Lancet Device Misc Check glucose twice daily.   Lancets Misc. Misc Check glucose twice daily.   lisinopril  40 MG tablet Commonly known as: ZESTRIL  Take 1 tablet (40 mg total) by mouth daily.   metFORMIN  1000 MG tablet Commonly known as: GLUCOPHAGE  Take 1 tablet (1,000 mg total) by mouth daily with breakfast.   OneTouch Delica Plus Lancet33G Misc Apply topically 2 (two) times daily.   OneTouch Verio test strip Generic drug: glucose blood CHECK GLUCOSE TWICE A DAY        All past medical history, surgical history, allergies, family history, immunizations andmedications were updated in the EMR today and reviewed under the history and medication portions of their EMR.     ROS Negative, with the exception of above mentioned in HPI   Objective:  BP 130/78   Pulse (!) 59   Temp 97.8 F (36.6 C)   Wt 176 lb 3.2 oz (79.9 kg)   LMP 04/27/2005 (Approximate)   SpO2 98%   BMI 32.23 kg/m  Body mass index is 32.23 kg/m. Physical Exam Vitals and nursing note reviewed.  Constitutional:      General: She is not in acute distress.    Appearance: Normal appearance. She is not ill-appearing, toxic-appearing or diaphoretic.  HENT:     Head: Normocephalic and atraumatic.  Eyes:     General: No scleral icterus.       Right eye: No discharge.        Left eye: No discharge.     Extraocular Movements: Extraocular movements intact.     Conjunctiva/sclera: Conjunctivae normal.     Pupils: Pupils are equal, round, and reactive to  light.  Cardiovascular:     Rate and Rhythm: Normal rate and regular rhythm.     Heart sounds: No murmur heard. Pulmonary:     Effort: Pulmonary effort is normal.     Breath sounds: Normal breath sounds.  Musculoskeletal:     Right  lower leg: No edema.     Left lower leg: No edema.  Skin:    General: Skin is warm.     Findings: No rash.  Neurological:     Mental Status: She is alert and oriented to person, place, and time. Mental status is at baseline.     Motor: No weakness.     Gait: Gait normal.  Psychiatric:        Mood and Affect: Mood normal.        Behavior: Behavior normal.        Thought Content: Thought content normal.        Judgment: Judgment normal.      No results found. No results found. No results found for this or any previous visit (from the past 24 hours).  Assessment/Plan: Maria Watkins is a 68 y.o. female present for OV for  Hypertension: -Reassured patient her blood pressures are not low at home or in the office.   Goal blood pressure<130/80. Continue lisinopril  40 mg and amlodipine  2.5 mg daily Low-sodium diet.  Dizziness Dizziness could be related to allergies, she does have left small ear effusion today.  No erythema.  Not infectious. Encouraged her to start her Claritin daily.  Reviewed expectations re: course of current medical issues. Discussed self-management of symptoms. Outlined signs and symptoms indicating need for more acute intervention. Patient verbalized understanding and all questions were answered. Patient received an After-Visit Summary.    No orders of the defined types were placed in this encounter.  No orders of the defined types were placed in this encounter.  Referral Orders  No referral(s) requested today     Note is dictated utilizing voice recognition software. Although note has been proof read prior to signing, occasional typographical errors still can be missed. If any questions arise, please do not  hesitate to call for verification.   electronically signed by:  Charlies Bellini, DO  [facility] - [place]

## 2024-03-07 NOTE — Patient Instructions (Signed)

## 2024-03-09 ENCOUNTER — Telehealth: Payer: Self-pay

## 2024-03-09 DIAGNOSIS — G4733 Obstructive sleep apnea (adult) (pediatric): Secondary | ICD-10-CM

## 2024-03-09 NOTE — Telephone Encounter (Signed)
 Copied from CRM #[id number]. Topic: General - Other >> Mar 09, 2024  1:43 PM Rosina BIRCH wrote: Reason for CRM: patient called stating she need another referral sent to pulmonary at [facility] because the other one expired [phone]

## 2024-03-09 NOTE — Telephone Encounter (Signed)
 Please advise if a new referral can be placed for pulmonology   02/28/2024  4:38 PM Maria Watkins Patient has cancelled two consults. Closing referral

## 2024-03-10 NOTE — Telephone Encounter (Signed)
 Referral placed.

## 2024-03-10 NOTE — Addendum Note (Signed)
 Addended by: GEORGEAN BEEN A on: 03/10/2024 02:13 PM   Modules accepted: Orders

## 2024-03-10 NOTE — Telephone Encounter (Signed)
Ok to place again.  

## 2024-03-28 ENCOUNTER — Ambulatory Visit (HOSPITAL_BASED_OUTPATIENT_CLINIC_OR_DEPARTMENT_OTHER)

## 2024-03-29 ENCOUNTER — Ambulatory Visit: Admitting: Family Medicine

## 2024-04-04 ENCOUNTER — Ambulatory Visit

## 2024-04-04 DIAGNOSIS — E538 Deficiency of other specified B group vitamins: Secondary | ICD-10-CM | POA: Diagnosis not present

## 2024-04-04 MED ORDER — CYANOCOBALAMIN 1000 MCG/ML IJ SOLN
1000.0000 ug | Freq: Once | INTRAMUSCULAR | Status: AC
  Administered 2024-04-04: 1000 ug via INTRAMUSCULAR

## 2024-04-04 NOTE — Progress Notes (Signed)
 Pt here for monthly B12 injection per Dr. Catherine   Last B12 injection:03/03/2024   B12 1000mcg given IM, and pt tolerated injection well.   Next B12 injection scheduled for: 1 month

## 2024-04-24 ENCOUNTER — Ambulatory Visit (HOSPITAL_BASED_OUTPATIENT_CLINIC_OR_DEPARTMENT_OTHER)

## 2024-04-25 ENCOUNTER — Ambulatory Visit: Admitting: Family Medicine

## 2024-05-01 ENCOUNTER — Ambulatory Visit: Admitting: Cardiovascular Disease

## 2024-05-04 ENCOUNTER — Encounter: Payer: Self-pay | Admitting: Cardiovascular Disease

## 2024-05-11 ENCOUNTER — Ambulatory Visit: Admitting: Family Medicine

## 2024-05-11 ENCOUNTER — Encounter: Payer: Self-pay | Admitting: Family Medicine

## 2024-05-11 VITALS — BP 152/80 | HR 65 | Temp 98.1°F | Wt 176.2 lb

## 2024-05-11 DIAGNOSIS — E66811 Obesity, class 1: Secondary | ICD-10-CM | POA: Diagnosis not present

## 2024-05-11 DIAGNOSIS — I517 Cardiomegaly: Secondary | ICD-10-CM

## 2024-05-11 DIAGNOSIS — I5189 Other ill-defined heart diseases: Secondary | ICD-10-CM

## 2024-05-11 DIAGNOSIS — I7789 Other specified disorders of arteries and arterioles: Secondary | ICD-10-CM

## 2024-05-11 DIAGNOSIS — E785 Hyperlipidemia, unspecified: Secondary | ICD-10-CM

## 2024-05-11 DIAGNOSIS — E1169 Type 2 diabetes mellitus with other specified complication: Secondary | ICD-10-CM | POA: Diagnosis not present

## 2024-05-11 DIAGNOSIS — E538 Deficiency of other specified B group vitamins: Secondary | ICD-10-CM | POA: Diagnosis not present

## 2024-05-11 DIAGNOSIS — Z6832 Body mass index (BMI) 32.0-32.9, adult: Secondary | ICD-10-CM | POA: Diagnosis not present

## 2024-05-11 DIAGNOSIS — M85852 Other specified disorders of bone density and structure, left thigh: Secondary | ICD-10-CM | POA: Diagnosis not present

## 2024-05-11 DIAGNOSIS — G4733 Obstructive sleep apnea (adult) (pediatric): Secondary | ICD-10-CM

## 2024-05-11 DIAGNOSIS — R002 Palpitations: Secondary | ICD-10-CM

## 2024-05-11 DIAGNOSIS — I10 Essential (primary) hypertension: Secondary | ICD-10-CM

## 2024-05-11 DIAGNOSIS — E559 Vitamin D deficiency, unspecified: Secondary | ICD-10-CM

## 2024-05-11 LAB — POCT GLYCOSYLATED HEMOGLOBIN (HGB A1C)
HbA1c POC (<> result, manual entry): 7.6 %
HbA1c, POC (controlled diabetic range): 7.6 % — AB (ref 0.0–7.0)
HbA1c, POC (prediabetic range): 7.6 % — AB (ref 5.7–6.4)
Hemoglobin A1C: 7.6 % — AB (ref 4.0–5.6)

## 2024-05-11 LAB — MICROALBUMIN / CREATININE URINE RATIO
Creatinine,U: 132.4 mg/dL
Microalb Creat Ratio: 34.1 mg/g — ABNORMAL HIGH (ref 0.0–30.0)
Microalb, Ur: 4.5 mg/dL — ABNORMAL HIGH (ref 0.7–1.9)

## 2024-05-11 MED ORDER — AMLODIPINE BESYLATE 2.5 MG PO TABS: 2.5000 mg | ORAL_TABLET | Freq: Every day | ORAL | 1 refills | Status: AC

## 2024-05-11 MED ORDER — DICLOFENAC SODIUM 75 MG PO TBEC: 75.0000 mg | DELAYED_RELEASE_TABLET | Freq: Two times a day (BID) | ORAL | 1 refills | Status: AC

## 2024-05-11 MED ORDER — CYANOCOBALAMIN 1000 MCG/ML IJ SOLN
1000.0000 ug | Freq: Once | INTRAMUSCULAR | Status: AC
  Administered 2024-05-11: 1000 ug via INTRAMUSCULAR

## 2024-05-11 MED ORDER — LISINOPRIL 40 MG PO TABS: 40.0000 mg | ORAL_TABLET | Freq: Every day | ORAL | 1 refills | Status: AC

## 2024-05-11 MED ORDER — METFORMIN HCL 1000 MG PO TABS: 1000.0000 mg | ORAL_TABLET | Freq: Every day | ORAL | 1 refills | Status: AC

## 2024-05-11 MED ORDER — GLIMEPIRIDE 2 MG PO TABS: ORAL_TABLET | ORAL | 1 refills | Status: AC

## 2024-05-11 NOTE — Patient Instructions (Addendum)
 Return in about 15 weeks (around 08/24/2024) for cpe (40 min), Routine chronic condition follow-up.   Always take Blood pressure medications prior to appt. Even if fasting you can take BP meds  Increase amaryl /glimepiride  to 2 mg a day.      Great to see you today.  I have refilled the medication(s) we provide.   If labs were collected or images ordered, we will inform you of  results once we have received them and reviewed. We will contact you either by echart message, or telephone call.  Please give ample time to the testing facility, and our office to run,  receive and review results. Please do not call inquiring of results, even if you can see them in your chart. We will contact you as soon as we are able. If it has been over 1 week since the test was completed, and you have not yet heard from us , then please call us .    - echart message- for normal results that have been seen by the patient already.   - telephone call: abnormal results or if patient has not viewed results in their echart.  If a referral to a specialist was entered for you, please call us  in 2 weeks if you have not heard from the specialist office to schedule.

## 2024-05-11 NOTE — Progress Notes (Signed)
 "      Maria Watkins , May 26, 1955, 69 y.o., female MRN: [id number] Patient Care Team    Relationship Specialty Notifications Start End  Catherine Charlies LABOR, DO PCP - General Family Medicine  12/26/21   Kristie Lamprey, MD Consulting Physician Gastroenterology  01/18/18   Gretta Gums, MD Consulting Physician Obstetrics  01/18/18   Elspeth Lauraine DEL, OD Referring Physician   04/09/21   Liam Lerner, MD Consulting Physician Orthopedic Surgery  06/10/23   Cesario Boer, MD Attending Physician Physical Medicine and Rehabilitation  06/10/23    Comment: lumbar epi inj    Chief Complaint  Patient presents with   Diabetes    Pt is fasting.    Hypertension     Subjective: Maria Watkins is a 69 y.o. Pt presents for a. Chronic Conditions/illness Management Medication reconciliation completed today. Past medical history updated with any changes if appropriate.  Essential hypertension/morbid obesity/palpitations/HLD/valve regurg/enlarged aorta Pt reports compliance with lisinopril  40 mg daily and amlodipine  2.5  mg daily.  He has not taken her medication yet today Patient denies chest pain, shortness of breath, dizziness or lower extremity edema.   Diet: Regular Exercise: Routine exercise RF: Hypertension, diabetes, obesity, former smoker, family history of heart disease Prior note: Pt is prescribed lisinopril  40 mg daily and amlodipine  2.5  mg daily.  She has not taking this medicine today as of yet.  She brings with her blood pressure readings she took yesterday 118/66, 106/57, 96/61. She reports she had not slept well the night before last.  She got up and went to the grocery store in the morning and when she was unloading the groceries she felt a wave of decreased focus.  She states she just felt blank for a few seconds.  Then about 30 minutes later she had a headache.  Later that day she became slightly dizzy so she sat down took her blood pressure, which was the first blood pressure mentioned above  and normal.  She waited 5 more minutes took her blood pressure again which was the second blood pressure mentioned above.  By then she did not have any symptoms but she took her blood pressure again and it was the third blood pressure mentioned above. - her BP readings fluctuate frequently in response to stress/anxiety, and reassurance has been required in the past for her not to worry about BP unless < 100 systolic with symptoms or consistently > 140 systolic.   She has been feeling more fatigued and having brain fog.  She feels the fatigue and brain fog started sometime ago but worsening recently.  She has been evaluated by cardiology.  She has not started the glimepiride  to treat her diabetes.  She has had ZIO monitoring, she may benefit from a low-dose beta-blocker with occasional short run of SVT.  However cardiology elected to wait until her next follow-up to discuss further if needed. Patient received her flu shot on Monday, 3 days ago. We have discussed performing a sleep apnea evaluation and she had wanted to wait to do this.  Cardiology has recently recommended she have a sleep apnea eval as well.  He does endorse when the few seconds of decreased focus or dizziness occurs, she also refers to as feeling blank for a few seconds she feels the initiation of this in her chest and it works its way up .   diabetes/morbid obesity: Patient reports compliance with metformin  1000 mg qd she has started the glimepiride  1 mg daily.  Reports Farxiga  caused her dizziness.   Patient denies dizziness, hyperglycemic or hypoglycemic events. Patient denies numbness, tingling in the extremities or nonhealing wounds of feet.   Echocardiogram 12/16/2023: IMPRESSIONS   1. Left ventricular ejection fraction, by estimation, is 55 to 60%. The  left ventricle has normal function. The left ventricle has no regional  wall motion abnormalities. There is mild concentric left ventricular  hypertrophy. Left  ventricular diastolic  parameters are indeterminate. The average left ventricular global  longitudinal strain is -19.8 %. The global longitudinal strain is normal.   2. Right ventricular systolic function is normal. The right ventricular  size is normal.   3. The mitral valve is normal in structure. No evidence of mitral valve  regurgitation. No evidence of mitral stenosis.   4. The aortic valve is normal in structure. Aortic valve regurgitation is  not visualized. No aortic stenosis is present.   5. There is mild dilatation of the ascending aorta, measuring 39 mm.   6. The inferior vena cava is normal in size with greater than 50%  respiratory variability, suggesting right atrial pressure of 3 mmHg.   Zio monitoring 03/02/2023: HR 42 - 193, average 73 bpm. 12 nonsustained SVT (longest 37 beats, 18.8 seconds) and 8 nonsustained wide complex tachycardias (longest 11 beats).  No atrial fibrillation detected. Rare supraventricular ectopy. Rare ventricular ectopy. No sustained arrhythmias. Symptom trigger episodes correspond to nonsustained tachycardia and ectopy.  Carotid Doppler studies 02/08/2023 IMPRESSION: Color duplex indicates no significant plaque, with no hemodynamically significant stenosis by duplex criteria in the extracranial cerebrovascular circulation.     05/11/2024    9:41 AM 02/09/2024    9:39 AM 12/07/2023   10:02 AM 09/23/2023    7:42 AM 06/10/2023   10:17 AM  Depression screen PHQ 2/9  Decreased Interest 0 1 1 1 1   Down, Depressed, Hopeless 0 1 1 0 1  PHQ - 2 Score 0 2 2 1 2   Altered sleeping 0 1 1 1 1   Tired, decreased energy 1 1 1 1 1   Change in appetite 0 0 0 0 0  Feeling bad or failure about yourself  0 0 1 1 0  Trouble concentrating 0 0 1 0 0  Moving slowly or fidgety/restless 0 0 1 0 1  Suicidal thoughts 0 0 0 0 0  PHQ-9 Score 1 4  7  4  5    Difficult doing work/chores Not difficult at all  Somewhat difficult Somewhat difficult Somewhat difficult      Data saved with a previous flowsheet row definition      06/10/2023   10:17 AM 09/23/2023    7:41 AM 12/07/2023   10:02 AM 02/09/2024    9:33 AM 05/11/2024    9:41 AM  Fall Risk  Falls in the past year? 0 0 0 0 0  Was there an injury with Fall?    0  0  Fall Risk Category Calculator    0 0  Patient at Risk for Falls Due to     No Fall Risks  Fall risk Follow up Falls evaluation completed Falls evaluation completed Falls evaluation completed Falls evaluation completed;Education provided;Falls prevention discussed Falls evaluation completed     Data saved with a previous flowsheet row definition    Allergies  Allergen Reactions   Terconazole Nausea Only   Fosamax  [Alendronate ] Diarrhea and Nausea Only   Social History   Social History Narrative   Not on file   Past Medical History:  Diagnosis  Date   Abnormal Pap smear of cervix 1980's   resolved post cryo with normal follow up   Calculus of gallbladder without cholecystitis without obstruction 05/19/2019   Depression    Elevated alkaline phosphatase level 05/15/2019   Estrogen deficiency 05/15/2019   FHx: breast cancer- x2 Mat aunt 11/17/2022   Gallstones 08/04/2012   GERD (gastroesophageal reflux disease) 11/09/2019   Hypertension    Lymphadenopathy 08/02/2019   Type 2 diabetes mellitus without complication, without long-term current use of insulin (HCC) 01/18/2018   Past Surgical History:  Procedure Laterality Date   CERVIX LESION DESTRUCTION  1980s   CHOLECYSTECTOMY N/A 01/13/2021   Procedure: LAPAROSCOPIC CHOLECYSTECTOMY WITH INTRAOPERATIVE CHOLANGIOGRAM;  Surgeon: Curvin Deward MOULD, MD;  Location: [facility] OR;  Service: General;  Laterality: N/A;   COLPOSCOPY  1980's   LUMBAR EPIDURAL INJECTION     L4-L5, May end up also with L2-3 in the future- Dr. Cesario   Family History  Problem Relation Age of Onset   Diabetes Mother    Arthritis Mother    COPD Father    Asthma Father    Heart disease Father    Diabetes Sister     Asthma Brother    COPD Sister    Rheum arthritis Sister    Pulmonary fibrosis Sister        Passed away 04-24-2020  Breast cancer Maternal Aunt    Alcohol abuse Son    Allergies as of 05/11/2024       Reactions   Terconazole Nausea Only   Fosamax  [alendronate ] Diarrhea, Nausea Only        Medication List        Accurate as of May 11, 2024 10:20 AM. If you have any questions, ask your nurse or doctor.          STOP taking these medications    Lancet Device Misc Stopped by: Charlies Bellini, DO       TAKE these medications    amLODipine  2.5 MG tablet Commonly known as: NORVASC  Take 1 tablet (2.5 mg total) by mouth daily.   Blood Glucose Monitoring Suppl Devi Check glucose twice daily.   OneTouch Verio Reflect w/Device Kit CHECK GLUCOSE TWICE DAILY   cholecalciferol 25 MCG (1000 UNIT) tablet Commonly known as: VITAMIN D3 Take 2,000 Units by mouth daily.   diclofenac  75 MG EC tablet Commonly known as: VOLTAREN  Take 1 tablet (75 mg total) by mouth 2 (two) times daily.   fluticasone  50 MCG/ACT nasal spray Commonly known as: FLONASE  Place 2 sprays into both nostrils in the morning and at bedtime.   glimepiride  2 MG tablet Commonly known as: AMARYL  1 tab p.o. daily. with largest meal of the day. What changed: medication strength Changed by: Charlies Bellini, DO   Lancets Misc. Misc Check glucose twice daily.   lisinopril  40 MG tablet Commonly known as: ZESTRIL  Take 1 tablet (40 mg total) by mouth daily.   metFORMIN  1000 MG tablet Commonly known as: GLUCOPHAGE  Take 1 tablet (1,000 mg total) by mouth daily with breakfast.   OneTouch Delica Plus Lancet33G Misc Apply topically 2 (two) times daily.   OneTouch Verio test strip Generic drug: glucose blood CHECK GLUCOSE TWICE A DAY        All past medical history, surgical history, allergies, family history, immunizations andmedications were updated in the EMR today and reviewed under the history  and medication portions of their EMR.     Review of Systems  Constitutional: Negative.   HENT:  Negative.    Eyes: Negative.   Respiratory: Negative.    Cardiovascular: Negative.   Gastrointestinal: Negative.   Genitourinary: Negative.   Musculoskeletal: Negative.   Skin: Negative.   Neurological: Negative.   Endo/Heme/Allergies: Negative.   Psychiatric/Behavioral: Negative.    All other systems reviewed and are negative.  Negative, with the exception of above mentioned in HPI   Objective:  BP (!) 152/80   Pulse 65   Temp 98.1 F (36.7 C)   Wt 176 lb 3.2 oz (79.9 kg)   LMP 04/27/2005   SpO2 99%   BMI 32.23 kg/m  Body mass index is 32.23 kg/m. Physical Exam Vitals and nursing note reviewed.  Constitutional:      General: She is not in acute distress.    Appearance: Normal appearance. She is normal weight. She is not ill-appearing, toxic-appearing or diaphoretic.  HENT:     Head: Normocephalic and atraumatic.  Eyes:     General: No scleral icterus.       Right eye: No discharge.        Left eye: No discharge.     Extraocular Movements: Extraocular movements intact.     Conjunctiva/sclera: Conjunctivae normal.     Pupils: Pupils are equal, round, and reactive to light.  Cardiovascular:     Rate and Rhythm: Normal rate and regular rhythm.  Pulmonary:     Effort: Pulmonary effort is normal. No respiratory distress.     Breath sounds: Normal breath sounds. No wheezing, rhonchi or rales.  Musculoskeletal:     Cervical back: Neck supple.     Right lower leg: No edema.     Left lower leg: No edema.  Skin:    General: Skin is warm.     Findings: No rash.  Neurological:     Mental Status: She is alert and oriented to person, place, and time. Mental status is at baseline.     Motor: No weakness.     Coordination: Coordination normal.     Gait: Gait normal.  Psychiatric:        Mood and Affect: Mood normal.        Behavior: Behavior normal.        Thought Content:  Thought content normal.        Judgment: Judgment normal.    Diabetic Foot Exam - Simple   Simple Foot Form Diabetic Foot exam was performed with the following findings: Yes 05/11/2024  9:25 AM  Visual Inspection No deformities, no ulcerations, no other skin breakdown bilaterally: Yes Sensation Testing Intact to touch and monofilament testing bilaterally: Yes Pulse Check Posterior Tibialis and Dorsalis pulse intact bilaterally: Yes Comments     No results found. No results found. Results for orders placed or performed in visit on 05/11/24 (from the past 24 hours)  POCT glycosylated hemoglobin (Hb A1C)     Status: Abnormal   Collection Time: 05/11/24  9:40 AM  Result Value Ref Range   Hemoglobin A1C 7.6 (A) 4.0 - 5.6 %   HbA1c POC (<> result, manual entry) 7.6 4.0 - 5.6 %   HbA1c, POC (prediabetic range) 7.6 (A) 5.7 - 6.4 %   HbA1c, POC (controlled diabetic range) 7.6 (A) 0.0 - 7.0 %     Assessment/Plan: Maria Watkins is a 69 y.o. female present for OV for  Type 2 diabetes mellitus with hyperlipidemia (HCC)/morbid obesity Discussed low glycemic diet in detail.  Routine exercise recommended.   increase amaryl  1 > 2mg  with breakfast  Continue metformin  1000 mg  goal < 7 -She was encouraged to focus on her diet and exercise  PNA series: Completed Flu shot: UTD 2025 (recommneded yearly) Foot exam: Completed 05/11/2024 Eye exam: Completed 06/21/2023-reminded patient her eye exam is due next month - Urine microalbumin-completed 05/11/2024 -GFR UTD 12/2023 A1c: 6.7>> 6.7>> 6.3>> 6.5> 6.6> 7.3> 6.9>6.4>6.9> 7.3> 7.7> 6.7>8.3> 7.1 >7.6 collected today   Essential/hypertension/obesity/palpitations/WCS/Diastolic dysfx- mild/LVH/enlarged aorta Stable-when taking medications.  She will have nurse visit in 2 weeks for BP recheck with the medication in her system. She was advised to always take her blood pressure medicines daily, even if fasting she can take her blood pressure  medications with a sip of water. Continue lisinopril  to 40 mg daily  Continue amlodipine  2.5 mg daily  - her BP readings fluctuate frequently in response to stress/anxiety,reassured her today, not to worry about BP unless < 100 systolic with symptoms or consistently > 140 systolic.  -Carotid Doppler (02/08/2023): WNL - Echo (12/16/2023): EF 55-60%.  Concentric LVH.  Dilated ascending aorta 39 mm -Zio (03/12/2023):12 nonsustained SVT (longest 37 beats, 18.8 seconds) and 8 nonsustained wide complex tachycardias (longest 11 beats).  -CAC score (12/16/2023): 0 -Established with cardiology-Dr. Court   Osteopenia of neck of left femur/vit d def - DG Bone Density 10/04/2023 utd - VITAMIN D  25 Hydroxy (Vit-D Deficiency, Fractures)-UTD  B12 deficiency - continue inj-every 4 weeks - completed today   Reviewed expectations re: course of current medical issues. Discussed self-management of symptoms. Outlined signs and symptoms indicating need for more acute intervention. Patient verbalized understanding and all questions were answered. Patient received an After-Visit Summary.  Return in about 15 weeks (around 08/24/2024) for cpe (40 min), Routine chronic condition follow-up.   Orders Placed This Encounter  Procedures   Urine Microalbumin w/creat. ratio   POCT glycosylated hemoglobin (Hb A1C)   Meds ordered this encounter  Medications   amLODipine  (NORVASC ) 2.5 MG tablet    Sig: Take 1 tablet (2.5 mg total) by mouth daily.    Dispense:  90 tablet    Refill:  1   diclofenac  (VOLTAREN ) 75 MG EC tablet    Sig: Take 1 tablet (75 mg total) by mouth 2 (two) times daily.    Dispense:  180 tablet    Refill:  1   glimepiride  (AMARYL ) 2 MG tablet    Sig: 1 tab p.o. daily. with largest meal of the day.    Dispense:  90 tablet    Refill:  1   lisinopril  (ZESTRIL ) 40 MG tablet    Sig: Take 1 tablet (40 mg total) by mouth daily.    Dispense:  90 tablet    Refill:  1   metFORMIN  (GLUCOPHAGE ) 1000  MG tablet    Sig: Take 1 tablet (1,000 mg total) by mouth daily with breakfast.    Dispense:  90 tablet    Refill:  1   cyanocobalamin  (VITAMIN B12) injection 1,000 mcg   Referral Orders  No referral(s) requested today     Note is dictated utilizing voice recognition software. Although note has been proof read prior to signing, occasional typographical errors still can be missed. If any questions arise, please do not hesitate to call for verification.   electronically signed by:  Charlies Bellini, DO  [facility] [place]    "

## 2024-05-12 ENCOUNTER — Encounter: Payer: Self-pay | Admitting: Family Medicine

## 2024-05-15 ENCOUNTER — Ambulatory Visit (INDEPENDENT_AMBULATORY_CARE_PROVIDER_SITE_OTHER)

## 2024-05-15 ENCOUNTER — Encounter (HOSPITAL_BASED_OUTPATIENT_CLINIC_OR_DEPARTMENT_OTHER): Payer: Self-pay

## 2024-05-15 VITALS — BP 168/82 | HR 66 | Ht 62.0 in | Wt 175.0 lb

## 2024-05-15 DIAGNOSIS — G4710 Hypersomnia, unspecified: Secondary | ICD-10-CM

## 2024-05-15 DIAGNOSIS — R0609 Other forms of dyspnea: Secondary | ICD-10-CM

## 2024-05-15 DIAGNOSIS — R0683 Snoring: Secondary | ICD-10-CM

## 2024-05-15 DIAGNOSIS — G4719 Other hypersomnia: Secondary | ICD-10-CM

## 2024-05-15 DIAGNOSIS — Z87891 Personal history of nicotine dependence: Secondary | ICD-10-CM

## 2024-05-15 DIAGNOSIS — R002 Palpitations: Secondary | ICD-10-CM

## 2024-05-15 NOTE — Progress Notes (Signed)
 "  @Patient  ID: Maria Watkins, female    DOB: 07-04-1955, 69 y.o.   MRN: [id number]  Chief Complaint  Patient presents with   Establish Care    New sleep     Referring provider: Catherine Charlies LABOR, DO  HPI: Discussed the use of AI scribe software for clinical note transcription with the patient, who gave verbal consent to proceed.  History of Present Illness Maria Watkins is a 69 year old female with hypertension and diabetes who presents with sleep disturbances and palpitations.  She experiences occasional snoring, as noted by her husband, and has had an episode of waking up at night gasping for breath, which she found startling. She associates these symptoms with dietary causes, experiencing heartburn and indigestion occasionally, managed with Tums. Her sleep pattern includes going to bed between 10 and 11 PM and waking up at 7 AM, with a couple of bathroom trips during the night. She generally feels rested in the morning, though recent stressors have affected her sleep quality.  Her hypertension is managed with lisinopril  and amlodipine . Home blood pressure readings typically range from 132/80 to 140/90 mmHg, but she notes elevations when upset. She did not take her medication on the morning of the visit, and her blood pressure was recorded at 177 mmHg systolic.  She describes episodes of a sensation like a burp getting stuck, causing momentary loss of control and disorientation. Avoiding caffeine has helped reduce these episodes, though acidic drinks like Sprite exacerbate her symptoms. She has a history of acid reflux, previously diagnosed by a doctor who has since moved. She is not currently on medication for reflux but manages symptoms with dietary adjustments.  She is the primary caregiver for her husband, who recently had surgery for a fractured femur, adding significant stress to her life. Additionally, she has a son in a rehab center, contributing to her stress.  She is  diabetic and feels unorganized with her medication and diet due to the stress of her current situation.   TEST/EVENTS :  ECHO:  12/16/2023:  EF 55-60%  mild dilatation of the ascending aorta measuring 39 mm  Allergies[1]  Immunization History  Administered Date(s) Administered   Fluad Quad(high Dose 65+) 01/16/2019, 03/19/2021, 02/04/2022   Fluad Trivalent(High Dose 65+) 02/15/2023   INFLUENZA, HIGH DOSE SEASONAL PF 01/16/2019, 02/20/2020, 01/03/2024   Influenza Inj Mdck Quad With Preservative 12/27/2018   Influenza,inj,Quad PF,6+ Mos 01/18/2018   Influenza-Unspecified 01/18/2018   Moderna Sars-Covid-2 Vaccination 06/08/2019, 07/07/2019   Pneumococcal Conjugate-13 02/23/2020   Pneumococcal Polysaccharide-23 04/14/2018   Tdap 04/27/2013    Past Medical History:  Diagnosis Date   Abnormal Pap smear of cervix 1980's   resolved post cryo with normal follow up   Calculus of gallbladder without cholecystitis without obstruction 05/19/2019   Depression    Elevated alkaline phosphatase level 05/15/2019   Estrogen deficiency 05/15/2019   FHx: breast cancer- x2 Mat aunt 11/17/2022   Gallstones 08/04/2012   GERD (gastroesophageal reflux disease) 11/09/2019   Hypertension    Lymphadenopathy 08/02/2019   Type 2 diabetes mellitus without complication, without long-term current use of insulin (HCC) 01/18/2018    Tobacco History: Tobacco Use History[2] Counseling given: Not Answered   Outpatient Medications Prior to Visit  Medication Sig Dispense Refill   amLODipine  (NORVASC ) 2.5 MG tablet Take 1 tablet (2.5 mg total) by mouth daily. 90 tablet 1   Blood Glucose Monitoring Suppl (ONETOUCH VERIO REFLECT) w/Device KIT CHECK GLUCOSE TWICE DAILY     Blood  Glucose Monitoring Suppl DEVI Check glucose twice daily. 1 each 0   cholecalciferol (VITAMIN D3) 25 MCG (1000 UT) tablet Take 2,000 Units by mouth daily.     diclofenac  (VOLTAREN ) 75 MG EC tablet Take 1 tablet (75 mg total) by mouth 2  (two) times daily. 180 tablet 1   fluticasone  (FLONASE ) 50 MCG/ACT nasal spray Place 2 sprays into both nostrils in the morning and at bedtime. 16 g 11   glimepiride  (AMARYL ) 2 MG tablet 1 tab p.o. daily. with largest meal of the day. 90 tablet 1   Lancets (ONETOUCH DELICA PLUS LANCET33G) MISC Apply topically 2 (two) times daily.     Lancets Misc. MISC Check glucose twice daily. 200 each 0   lisinopril  (ZESTRIL ) 40 MG tablet Take 1 tablet (40 mg total) by mouth daily. 90 tablet 1   metFORMIN  (GLUCOPHAGE ) 1000 MG tablet Take 1 tablet (1,000 mg total) by mouth daily with breakfast. 90 tablet 1   ONETOUCH VERIO test strip CHECK GLUCOSE TWICE A DAY 200 strip 0   No facility-administered medications prior to visit.     Review of Systems: as per hpi  Constitutional:   No  weight loss, night sweats,  Fevers, chills, fatigue, or  lassitude.  HEENT:   No headaches,  Difficulty swallowing,  Tooth/dental problems, or  Sore throat,                No sneezing, itching, ear ache, nasal congestion, post nasal drip,   CV:  No chest pain,  Orthopnea, PND, swelling in lower extremities, anasarca, dizziness, palpitations, syncope.   GI  No heartburn, indigestion, abdominal pain, nausea, vomiting, diarrhea, change in bowel habits, loss of appetite, bloody stools.   Resp: No shortness of breath with exertion or at rest.  No excess mucus, no productive cough,  No non-productive cough,  No coughing up of blood.  No change in color of mucus.  No wheezing.  No chest wall deformity  Skin: no rash or lesions.  GU: no dysuria, change in color of urine, no urgency or frequency.  No flank pain, no hematuria   MS:  No joint pain or swelling.  No decreased range of motion.  No back pain.    Physical Exam  BP (!) 168/82   Pulse 66   Ht 5' 2 (1.575 m)   Wt 175 lb (79.4 kg)   LMP 04/27/2005   SpO2 97%   BMI 32.01 kg/m   GEN: A/Ox3; pleasant , well nourished.  Appears somewhat anxious and tearful at times.      HEENT:  [place]/AT,  EACs-clear, TMs-wnl, NOSE-clear, THROAT-clear, no lesions, no postnasal drip or exudate noted. Mallampati 2  NECK:  Supple w/ fair ROM; no JVD; normal carotid impulses w/o bruits; no thyromegaly or nodules palpated; no lymphadenopathy.    RESP  Clear  P & A; w/o, wheezes/ rales/ or rhonchi. no accessory muscle use, no dullness to percussion  CARD:  RRR, no m/r/g, no peripheral edema, pulses intact, no cyanosis or clubbing.  GI:   Soft & nt; nml bowel sounds; no organomegaly or masses detected.   Musco: Warm bil, no deformities or joint swelling noted.   Neuro: alert, no focal deficits noted.    Skin: Warm, no lesions or rashes    Lab Results:  CBC    Component Value Date/Time   WBC 6.2 01/06/2024 1124   RBC 4.62 01/06/2024 1124   HGB 13.5 01/06/2024 1124   HCT 39.9 01/06/2024 1124  PLT 158.0 01/06/2024 1124   MCV 86.3 01/06/2024 1124   MCH 29.1 01/07/2021 1133   MCHC 33.8 01/06/2024 1124   RDW 13.4 01/06/2024 1124   LYMPHSABS 2.3 01/06/2024 1124   MONOABS 0.6 01/06/2024 1124   EOSABS 0.1 01/06/2024 1124   BASOSABS 0.0 01/06/2024 1124    BMET    Component Value Date/Time   NA 138 01/06/2024 1124   NA 139 10/16/2017 0000   K 4.4 01/06/2024 1124   CL 101 01/06/2024 1124   CO2 28 01/06/2024 1124   GLUCOSE 155 (H) 01/06/2024 1124   BUN 10 01/06/2024 1124   BUN 12 10/16/2017 0000   CREATININE 0.76 01/06/2024 1124   CALCIUM 9.9 01/06/2024 1124   GFRNONAA >60 01/07/2021 1133    BNP No results found for: BNP  ProBNP No results found for: PROBNP  Imaging: No results found.  cyanocobalamin  (VITAMIN B12) injection 1,000 mcg     Date Action Dose Route User   04/04/2024 1535 Given 1,000 mcg Intramuscular (Left Deltoid) Smith, Vanessa W, CMA      cyanocobalamin  (VITAMIN B12) injection 1,000 mcg     Date Action Dose Route User   05/11/2024 0939 Given 1,000 mcg Intramuscular (Left Deltoid) Craiger, Chloe A, CMA           No data  to display          No results found for: NITRICOXIDE   Assessment & Plan:   Assessment & Plan Excessive daytime sleepiness  Assessment and Plan Assessment & Plan Suspected obstructive sleep apnea Intermittent snoring, nocturnal dyspnea, and palpitations suggest possible obstructive sleep apnea. Elevated blood pressure may be related. Differential includes reflux and stress-related symptoms. - Ordered home sleep study to evaluate for obstructive sleep apnea. - Schedule follow-up in 6-8 weeks to discuss results.    Return in about 7 weeks (around 07/03/2024) for sleep study review.  Candis Dandy, PA-C 05/15/2024      [1]  Allergies Allergen Reactions   Terconazole Nausea Only   Fosamax  [Alendronate ] Diarrhea and Nausea Only  [2]  Social History Tobacco Use  Smoking Status Former   Current packs/day: 0.00   Types: Cigarettes   Quit date: 04/28/1991   Years since quitting: 33.0  Smokeless Tobacco Never   "

## 2024-05-15 NOTE — Patient Instructions (Signed)
 Complete home sleep test as ordered.  Follow up in 6-8 weeks to review results.

## 2024-05-15 NOTE — Progress Notes (Signed)
Epworth Sleepiness Scale  Use the following scale to choose the most appropriate number for each situation. 0 Would never nod off 1  Slight  chance of nodding off 2 Moderate chance of nodding off 3 High chance of nodding off  Sitting and reading: 0 Watching TV: 0 Sitting, inactive, in a public place (e.g., in a meeting, theater, or dinner event): 0 As a passenger in a car for an hour or more without stopping for a break: 0 Lying down to rest when circumstances permit:0 Sitting and talking to someone: 0 Sitting quietly after a meal without alcohol: 0 In a car, while stopped for a few  minutes in traffic or at a light: 0  TOTOAL: 0

## 2024-05-18 ENCOUNTER — Ambulatory Visit: Payer: Self-pay | Admitting: Family Medicine

## 2024-05-29 ENCOUNTER — Ambulatory Visit: Admitting: Cardiovascular Disease

## 2024-07-03 ENCOUNTER — Ambulatory Visit (HOSPITAL_BASED_OUTPATIENT_CLINIC_OR_DEPARTMENT_OTHER)

## 2024-08-23 ENCOUNTER — Ambulatory Visit: Admitting: Family Medicine

## 2024-09-26 ENCOUNTER — Encounter: Admitting: Family Medicine

## 2025-02-14 ENCOUNTER — Ambulatory Visit
